# Patient Record
Sex: Female | Born: 1955
Health system: Southern US, Community
[De-identification: ages and names within clinical notes are randomized; demographics above are authoritative.]

## PROBLEM LIST (undated history)

## (undated) DIAGNOSIS — I479 Paroxysmal tachycardia, unspecified: Secondary | ICD-10-CM

## (undated) DIAGNOSIS — J45909 Unspecified asthma, uncomplicated: Secondary | ICD-10-CM

## (undated) DIAGNOSIS — I10 Essential (primary) hypertension: Secondary | ICD-10-CM

## (undated) DIAGNOSIS — M949 Disorder of cartilage, unspecified: Secondary | ICD-10-CM

## (undated) DIAGNOSIS — Z8489 Family history of other specified conditions: Secondary | ICD-10-CM

## (undated) DIAGNOSIS — M1990 Unspecified osteoarthritis, unspecified site: Secondary | ICD-10-CM

## (undated) DIAGNOSIS — M899 Disorder of bone, unspecified: Secondary | ICD-10-CM

## (undated) DIAGNOSIS — Z9189 Other specified personal risk factors, not elsewhere classified: Secondary | ICD-10-CM

## (undated) DIAGNOSIS — E785 Hyperlipidemia, unspecified: Secondary | ICD-10-CM

## (undated) DIAGNOSIS — M542 Cervicalgia: Secondary | ICD-10-CM

## (undated) DIAGNOSIS — S82831A Other fracture of upper and lower end of right fibula, initial encounter for closed fracture: Secondary | ICD-10-CM

## (undated) HISTORY — DX: Unspecified asthma, uncomplicated: J45.909

## (undated) HISTORY — DX: Hyperlipidemia, unspecified: E78.5

## (undated) HISTORY — DX: Essential (primary) hypertension: I10

## (undated) HISTORY — DX: Disorder of bone, unspecified: M89.9

## (undated) HISTORY — DX: Disorder of cartilage, unspecified: M94.9

## (undated) HISTORY — PX: DILATION AND CURETTAGE OF UTERUS: SHX78

## (undated) HISTORY — DX: Unspecified osteoarthritis, unspecified site: M19.90

## (undated) HISTORY — DX: Paroxysmal tachycardia, unspecified: I47.9

## (undated) HISTORY — DX: Cervicalgia: M54.2

## (undated) HISTORY — DX: Other specified personal risk factors, not elsewhere classified: Z91.89

---

## 1998-08-18 ENCOUNTER — Encounter: Payer: Self-pay | Admitting: *Deleted

## 1998-08-18 ENCOUNTER — Ambulatory Visit (HOSPITAL_COMMUNITY): Admission: RE | Admit: 1998-08-18 | Discharge: 1998-08-18 | Payer: Self-pay | Admitting: *Deleted

## 1998-10-07 ENCOUNTER — Encounter: Admission: RE | Admit: 1998-10-07 | Discharge: 1998-12-04 | Payer: Self-pay | Admitting: Orthopaedic Surgery

## 1999-12-01 ENCOUNTER — Ambulatory Visit (HOSPITAL_COMMUNITY): Admission: RE | Admit: 1999-12-01 | Discharge: 1999-12-01 | Payer: Self-pay | Admitting: Internal Medicine

## 2001-03-08 ENCOUNTER — Encounter: Payer: Self-pay | Admitting: Internal Medicine

## 2001-03-08 ENCOUNTER — Encounter: Admission: RE | Admit: 2001-03-08 | Discharge: 2001-03-08 | Payer: Self-pay | Admitting: Internal Medicine

## 2002-10-18 ENCOUNTER — Encounter: Payer: Self-pay | Admitting: Internal Medicine

## 2002-10-18 ENCOUNTER — Ambulatory Visit (HOSPITAL_COMMUNITY): Admission: RE | Admit: 2002-10-18 | Discharge: 2002-10-18 | Payer: Self-pay | Admitting: Internal Medicine

## 2002-11-27 ENCOUNTER — Encounter: Payer: Self-pay | Admitting: General Surgery

## 2002-11-27 ENCOUNTER — Encounter: Admission: RE | Admit: 2002-11-27 | Discharge: 2002-11-27 | Payer: Self-pay | Admitting: General Surgery

## 2002-12-03 ENCOUNTER — Encounter: Payer: Self-pay | Admitting: Pulmonary Disease

## 2002-12-03 ENCOUNTER — Encounter: Admission: RE | Admit: 2002-12-03 | Discharge: 2002-12-03 | Payer: Self-pay | Admitting: Pulmonary Disease

## 2005-02-11 ENCOUNTER — Encounter: Admission: RE | Admit: 2005-02-11 | Discharge: 2005-02-11 | Payer: Self-pay | Admitting: Internal Medicine

## 2005-07-19 ENCOUNTER — Ambulatory Visit: Payer: Self-pay | Admitting: Pulmonary Disease

## 2005-09-06 ENCOUNTER — Other Ambulatory Visit: Admission: RE | Admit: 2005-09-06 | Discharge: 2005-09-06 | Payer: Self-pay | Admitting: Obstetrics and Gynecology

## 2005-09-15 ENCOUNTER — Ambulatory Visit: Payer: Self-pay | Admitting: Internal Medicine

## 2006-05-23 LAB — HM MAMMOGRAPHY: HM Mammogram: NORMAL

## 2006-08-17 ENCOUNTER — Encounter: Payer: Self-pay | Admitting: Internal Medicine

## 2006-09-13 ENCOUNTER — Ambulatory Visit (HOSPITAL_COMMUNITY): Admission: RE | Admit: 2006-09-13 | Discharge: 2006-09-13 | Payer: Self-pay | Admitting: Internal Medicine

## 2006-09-18 ENCOUNTER — Encounter: Admission: RE | Admit: 2006-09-18 | Discharge: 2006-09-18 | Payer: Self-pay | Admitting: Internal Medicine

## 2006-09-26 ENCOUNTER — Ambulatory Visit: Payer: Self-pay

## 2006-09-26 ENCOUNTER — Encounter: Payer: Self-pay | Admitting: Internal Medicine

## 2007-01-26 ENCOUNTER — Ambulatory Visit: Payer: Self-pay | Admitting: Internal Medicine

## 2007-01-26 LAB — CONVERTED CEMR LAB: Direct LDL: 149.7 mg/dL

## 2007-02-12 ENCOUNTER — Encounter: Payer: Self-pay | Admitting: Internal Medicine

## 2007-08-27 DIAGNOSIS — R609 Edema, unspecified: Secondary | ICD-10-CM | POA: Insufficient documentation

## 2007-09-06 ENCOUNTER — Encounter: Payer: Self-pay | Admitting: Internal Medicine

## 2007-09-06 DIAGNOSIS — F5104 Psychophysiologic insomnia: Secondary | ICD-10-CM | POA: Insufficient documentation

## 2007-09-06 DIAGNOSIS — G4700 Insomnia, unspecified: Secondary | ICD-10-CM | POA: Insufficient documentation

## 2007-09-06 DIAGNOSIS — M1990 Unspecified osteoarthritis, unspecified site: Secondary | ICD-10-CM | POA: Insufficient documentation

## 2007-11-22 ENCOUNTER — Telehealth: Payer: Self-pay | Admitting: Family Medicine

## 2008-04-23 ENCOUNTER — Telehealth: Payer: Self-pay | Admitting: Internal Medicine

## 2008-04-23 ENCOUNTER — Ambulatory Visit: Payer: Self-pay | Admitting: Internal Medicine

## 2008-04-24 LAB — CONVERTED CEMR LAB
ALT: 17 units/L (ref 0–35)
Albumin: 3.7 g/dL (ref 3.5–5.2)
Total Bilirubin: 0.3 mg/dL (ref 0.3–1.2)

## 2008-06-09 ENCOUNTER — Emergency Department (HOSPITAL_COMMUNITY): Admission: EM | Admit: 2008-06-09 | Discharge: 2008-06-09 | Payer: Self-pay | Admitting: Family Medicine

## 2008-06-17 ENCOUNTER — Encounter (INDEPENDENT_AMBULATORY_CARE_PROVIDER_SITE_OTHER): Payer: Self-pay | Admitting: Cardiology

## 2008-06-17 ENCOUNTER — Ambulatory Visit (HOSPITAL_COMMUNITY): Admission: RE | Admit: 2008-06-17 | Discharge: 2008-06-17 | Payer: Self-pay | Admitting: Cardiology

## 2008-07-21 LAB — CONVERTED CEMR LAB: Pap Smear: NORMAL

## 2008-08-28 ENCOUNTER — Ambulatory Visit: Payer: Self-pay | Admitting: Internal Medicine

## 2008-08-28 DIAGNOSIS — E785 Hyperlipidemia, unspecified: Secondary | ICD-10-CM | POA: Insufficient documentation

## 2008-08-28 DIAGNOSIS — Z9289 Personal history of other medical treatment: Secondary | ICD-10-CM | POA: Insufficient documentation

## 2008-08-28 DIAGNOSIS — I479 Paroxysmal tachycardia, unspecified: Secondary | ICD-10-CM | POA: Insufficient documentation

## 2008-08-28 DIAGNOSIS — E7849 Other hyperlipidemia: Secondary | ICD-10-CM | POA: Insufficient documentation

## 2008-08-28 DIAGNOSIS — I471 Supraventricular tachycardia: Secondary | ICD-10-CM | POA: Insufficient documentation

## 2008-08-28 DIAGNOSIS — E7800 Pure hypercholesterolemia, unspecified: Secondary | ICD-10-CM | POA: Insufficient documentation

## 2008-08-29 LAB — CONVERTED CEMR LAB
ALT: 16 units/L (ref 0–35)
AST: 19 units/L (ref 0–37)
BUN: 9 mg/dL (ref 6–23)
Basophils Relative: 0.6 % (ref 0.0–3.0)
Bilirubin Urine: NEGATIVE
Bilirubin, Direct: 0.1 mg/dL (ref 0.0–0.3)
Chloride: 106 meq/L (ref 96–112)
Eosinophils Absolute: 0.7 10*3/uL (ref 0.0–0.7)
Eosinophils Relative: 10.1 % — ABNORMAL HIGH (ref 0.0–5.0)
HCT: 41 % (ref 36.0–46.0)
Hemoglobin: 14.1 g/dL (ref 12.0–15.0)
Ketones, ur: NEGATIVE mg/dL
Leukocytes, UA: NEGATIVE
Lymphocytes Relative: 25.6 % (ref 12.0–46.0)
MCHC: 34.4 g/dL (ref 30.0–36.0)
MCV: 86.2 fL (ref 78.0–100.0)
Neutro Abs: 4.2 10*3/uL (ref 1.4–7.7)
Neutrophils Relative %: 56.3 % (ref 43.0–77.0)
Nitrite: NEGATIVE
Platelets: 288 10*3/uL (ref 150.0–400.0)
RBC: 4.76 M/uL (ref 3.87–5.11)
RDW: 12.3 % (ref 11.5–14.6)
Sodium: 142 meq/L (ref 135–145)
Specific Gravity, Urine: 1.025 (ref 1.000–1.030)
Total CHOL/HDL Ratio: 5
Total Protein, Urine: NEGATIVE mg/dL
WBC: 7.3 10*3/uL (ref 4.5–10.5)

## 2008-09-01 LAB — CONVERTED CEMR LAB: Vit D, 25-Hydroxy: 11 ng/mL — ABNORMAL LOW (ref 30–89)

## 2008-09-16 ENCOUNTER — Telehealth: Payer: Self-pay | Admitting: Internal Medicine

## 2008-12-03 ENCOUNTER — Encounter: Payer: Self-pay | Admitting: Internal Medicine

## 2009-01-22 ENCOUNTER — Telehealth: Payer: Self-pay | Admitting: Internal Medicine

## 2009-08-26 ENCOUNTER — Telehealth: Payer: Self-pay | Admitting: Internal Medicine

## 2009-09-04 ENCOUNTER — Telehealth: Payer: Self-pay | Admitting: Internal Medicine

## 2009-10-13 ENCOUNTER — Telehealth: Payer: Self-pay | Admitting: Internal Medicine

## 2009-11-03 ENCOUNTER — Ambulatory Visit: Payer: Self-pay | Admitting: Internal Medicine

## 2009-11-03 LAB — CONVERTED CEMR LAB
ALT: 17 units/L (ref 0–35)
Albumin: 4.1 g/dL (ref 3.5–5.2)
BUN: 8 mg/dL (ref 6–23)
Basophils Absolute: 0 10*3/uL (ref 0.0–0.1)
Bilirubin Urine: NEGATIVE
CO2: 28 meq/L (ref 19–32)
Calcium: 9.3 mg/dL (ref 8.4–10.5)
Chloride: 105 meq/L (ref 96–112)
Creatinine, Ser: 0.7 mg/dL (ref 0.4–1.2)
Glucose, Bld: 97 mg/dL (ref 70–99)
HCT: 41.4 % (ref 36.0–46.0)
HDL: 42.5 mg/dL (ref >39.00)
Hemoglobin, Urine: NEGATIVE
LDL Cholesterol: 108 mg/dL — ABNORMAL HIGH (ref 0–99)
Leukocytes, UA: NEGATIVE
Lymphocytes Relative: 24.8 % (ref 12.0–46.0)
Lymphs Abs: 1.8 10*3/uL (ref 0.7–4.0)
MCHC: 34.5 g/dL (ref 30.0–36.0)
MCV: 87.5 fL (ref 78.0–100.0)
Nitrite: NEGATIVE
Potassium: 4.7 meq/L (ref 3.5–5.1)
RBC: 4.73 M/uL (ref 3.87–5.11)
RDW: 13.1 % (ref 11.5–14.6)
Total CHOL/HDL Ratio: 4
Total Protein: 6.6 g/dL (ref 6.0–8.3)
Triglycerides: 135 mg/dL (ref 0.0–149.0)
Urine Glucose: NEGATIVE mg/dL
VLDL: 27 mg/dL (ref 0.0–40.0)
WBC: 7.1 10*3/uL (ref 4.5–10.5)
pH: 7 (ref 5.0–8.0)

## 2009-11-05 ENCOUNTER — Telehealth: Payer: Self-pay | Admitting: Internal Medicine

## 2009-11-05 ENCOUNTER — Ambulatory Visit: Payer: Self-pay | Admitting: Internal Medicine

## 2009-11-05 DIAGNOSIS — M542 Cervicalgia: Secondary | ICD-10-CM

## 2009-11-05 DIAGNOSIS — J4530 Mild persistent asthma, uncomplicated: Secondary | ICD-10-CM | POA: Insufficient documentation

## 2009-11-05 HISTORY — DX: Cervicalgia: M54.2

## 2009-11-11 ENCOUNTER — Ambulatory Visit: Payer: Self-pay | Admitting: Internal Medicine

## 2009-11-11 ENCOUNTER — Encounter: Payer: Self-pay | Admitting: Internal Medicine

## 2009-11-20 ENCOUNTER — Telehealth: Payer: Self-pay | Admitting: Internal Medicine

## 2009-11-26 DIAGNOSIS — M810 Age-related osteoporosis without current pathological fracture: Secondary | ICD-10-CM | POA: Insufficient documentation

## 2009-11-26 DIAGNOSIS — M8580 Other specified disorders of bone density and structure, unspecified site: Secondary | ICD-10-CM | POA: Insufficient documentation

## 2009-12-15 ENCOUNTER — Telehealth: Payer: Self-pay | Admitting: Internal Medicine

## 2009-12-23 ENCOUNTER — Encounter: Payer: Self-pay | Admitting: Internal Medicine

## 2010-01-08 ENCOUNTER — Encounter (INDEPENDENT_AMBULATORY_CARE_PROVIDER_SITE_OTHER): Payer: Self-pay | Admitting: *Deleted

## 2010-04-05 ENCOUNTER — Encounter: Admission: RE | Admit: 2010-04-05 | Discharge: 2010-04-05 | Payer: Self-pay | Admitting: Obstetrics and Gynecology

## 2010-04-29 ENCOUNTER — Telehealth (INDEPENDENT_AMBULATORY_CARE_PROVIDER_SITE_OTHER): Payer: Self-pay | Admitting: *Deleted

## 2010-05-07 ENCOUNTER — Ambulatory Visit: Payer: Self-pay | Admitting: Internal Medicine

## 2010-06-22 NOTE — Assessment & Plan Note (Signed)
Summary: CPX/NWS   Vital Signs:  Patient profile:   55 year old female Height:      62 inches (157.48 cm) Weight:      191.12 pounds (86.87 kg) BMI:     35.08 O2 Sat:      97 % on Room air Temp:     98.0 degrees F (36.67 degrees C) oral Pulse rate:   105 / minute BP sitting:   118 / 80  (left arm) Cuff size:   large  Vitals Entered By: Orlan Leavens (November 05, 2009 8:11 AM)  O2 Flow:  Room air CC: CPX/ Need refill on nexium & verapamil Is Patient Diabetic? No Pain Assessment Patient in pain? no        Primary Care Provider:  Newt Lukes MD  CC:  CPX/ Need refill on nexium & verapamil.  History of Present Illness: patient is here today for annual physical. Patient feels well and has no complaints.   review of other chronic med issues - has seen dr. 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 little (cards)  2) occ R sided abd pain - maybe 2 episodes per month - can be severe cramping but not long duration not clearly a/w food/meals or time of day. has had gyn exam and pelivec u/s - no abn identified.  3) asthma hx - reports h/o  +ppd in 2002 after exposure to "white powder" via unmarked envelop to her office - no  +PPD prior to 2002 or known TB hx. 3 office staff with +PPD after that exposure - "powder" never further analyzed - no flare of asthma symptoms but would like CXR to eval annually   also, c/o neck pain - onset >6 months ago -  progressive pain symptoms - no radiation to shoulders or hands - no balance or dizzy problems -  would like to see [facility]ner for eval of neck pain - no prior imaging ever done   Preventive Screening-Counseling & Management  Alcohol-Tobacco     Alcohol drinks/day: <1     Smoking Status: quit > 6 months     Year Started: 1996     Year Quit: 2002  Caffeine-Diet-Exercise  Does Patient Exercise: no     Exercise Counseling: to improve exercise regimen  Safety-Violence-Falls     Seat Belt Counseling: not indicated; patient wears seat belts     Helmet Counseling: not indicated; patient wears helmet when riding bicycle/motocycle     Smoke Detector Counseling: n/a     Violence Counseling: not indicated; no violence risk noted     Fall Risk Counseling: not indicated; no significant falls noted  Clinical Review Panels:  Prevention   Last Mammogram:  normal (05/23/2006)   Last Pap Smear:  normal (07/21/2008)  Immunizations   Last Tetanus Booster:  Tdap (08/28/2008)  Lipid Management   Cholesterol:  177 (11/03/2009)   LDL (bad choesterol):  108 (11/03/2009)   HDL (good cholesterol):  42.50 (11/03/2009)  CBC   WBC:  7.1 (11/03/2009)   RBC:  4.73 (11/03/2009)   Hgb:  14.3 (11/03/2009)   Hct:  41.4 (11/03/2009)   Platelets:  309.0 (11/03/2009)   MCV  87.5 (11/03/2009)   MCHC  34.5 (11/03/2009)   RDW  13.1 (11/03/2009)   PMN:  59.0 (11/03/2009)   Lymphs:  24.8 (11/03/2009)   Monos:  7.8 (11/03/2009)   Eosinophils:  7.8 (11/03/2009)   Basophil:  0.6 (11/03/2009)  Complete Metabolic Panel   Glucose:  97 (11/03/2009)   Sodium:  141 (11/03/2009)   Potassium:  4.7 (11/03/2009)   Chloride:  105 (11/03/2009)   CO2:  28 (11/03/2009)   BUN:  8 (11/03/2009)   Creatinine:  0.7 (11/03/2009)   Albumin:  4.1 (11/03/2009)   Total Protein:  6.6 (11/03/2009)   Calcium:  9.3 (11/03/2009)   Total Bili:  0.6 (11/03/2009)   Alk Phos:  74 (11/03/2009)   SGPT (ALT):  17 (11/03/2009)   SGOT (AST):  17 (11/03/2009)   Current Medications (verified): 1)  Cyclobenzaprine Hcl 10 Mg  Tabs (Cyclobenzaprine Hcl) .Marland Kitchen.. 1 Three Times A Day Prn 2)  Nexium 40 Mg  Cpdr (Esomeprazole Magnesium) .Marland Kitchen.. 1 Daily 3)  Verapamil Hcl Cr 180 Mg Xr24h-Cap (Verapamil Hcl) .... Take 1 By Mouth Qd 4)  Lorazepam 1 Mg Tabs (Lorazepam) .... 1/-2 Tabs For Sleep If Needed  Allergies  (verified): 1)  ! 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 I have reviewed all other systems and they were negative.   Physical Exam  General:  alert, well-developed, well-nourished, and cooperative to examination.    Head:  normocephalic, atraumatic, and no abnormalities observed.    Eyes:  vision grossly intact; pupils equal, round and reactive to light.  conjunctiva and lids normal.    Ears:  External ear exam shows no significant lesions or deformities.  Otoscopic examination reveals clear canals, tympanic membranes are intact bilaterally without bulging, retraction, inflammation or discharge. Hearing is grossly normal bilaterally. Nose:  External nasal examination shows no deformity or inflammation. Nasal mucosa are pink and moist without lesions or exudates. Mouth:  Oral mucosa and oropharynx without lesions or exudates.  Teeth in good repair, mild staining noted Neck:  No deformities, masses, or tenderness noted. no thyromegaly and no thyroid nodules or tenderness.   Lungs:  normal respiratory effort, no intercostal retractions or use of accessory muscles; normal breath sounds bilaterally - no crackles and no wheezes.    Heart:  normal rate, regular rhythm, no murmur, and no rub. BLE without edema. normal DP pulses and normal cap refill in all 4 extremities    Abdomen:  soft, non-tender, normal bowel sounds, no distention; no masses and no appreciable hepatomegaly or  splenomegaly.   Genitalia:  defer to gyn Msk:  No deformity or scoliosis noted of thoracic or lumbar spine.   Neurologic:  alert & oriented X3 and cranial nerves II-XII symetrically intact.  strength normal in all extremities, sensation intact to light touch, and gait normal. speech fluent without dysarthria or aphasia; follows commands with good comprehension.  Skin:  Intact without suspicious lesions or rashes Psych:  Oriented X3, memory intact for recent and remote, normally interactive, good eye contact, not anxious appearing, not depressed appearing, and not agitated.      Impression & Recommendations:  Problem # 1:  PREVENTIVE  HEALTH CARE (ICD-V70.0) Patient has been counseled on age-appropriate routine health concerns for screening and prevention. These are reviewed and up-to-date. Immunizations are up-to-date or declined. Labs and ECG reviewed. refer for screening colo and bone density Orders: EKG w/ Interpretation (93000) T-Bone Densitometry ([address]) T-Lumbar Vertebral Assessment (60454) Gastroenterology Referral (GI)  Problem # 2:  NECK PAIN (ICD-723.1) ?DDD -  check labs and refer to Nsurg per request Her updated medication list for this problem includes:    Cyclobenzaprine Hcl 10 Mg Tabs (Cyclobenzaprine hcl) .Marland Kitchen... 1 three times a day prn    Aspirin 81 Mg Tabs (Aspirin) .Marland Kitchen... 1 by mouth once daily  Orders: T-Cervical Spine Comp 4 Views [phone]) Neurosurgeon Referral (Neurosurgeon)  Problem # 3:  ASTHMA, UNSPECIFIED, UNSPECIFIED STATUS (ICD-493.90)  Orders: T-2 View CXR (71020TC)  Complete Medication List: 1)  Cyclobenzaprine Hcl 10 Mg Tabs (Cyclobenzaprine hcl) .Marland Kitchen.. 1 three times a day prn 2)  Nexium 40 Mg Cpdr (Esomeprazole magnesium) .Marland Kitchen.. 1 daily 3)  Verapamil Hcl Cr 180 Mg Xr24h-cap (Verapamil hcl) .... Take 1 by mouth qd 4)  Lorazepam 1 Mg Tabs (Lorazepam) .... 1/-2 tabs for sleep if needed 5)  Aspirin 81 Mg Tabs (Aspirin) .Marland Kitchen.. 1 by mouth once daily 6)  Vitamin  D 1000 Unit Tabs (Cholecalciferol) .Marland Kitchen.. 1 by mouth once daily  Patient Instructions: 1)  it was good to see you today.  2)  labs and exam lok good as reviewed - 3)  we'll make referral for bone density scan, colonoscopy with Christella Hartigan, and evaluation by [facility] for your neck. Our office will contact you regarding these appointments once made.  4)  xrays ordered today for chest and neck - your results will be posted on the phone tree for review in 48-72 hours from the time of test completion; call [phone] and enter your 9 digit MRN (listed above on this page, just below your name); if any changes need to be made or there are abnormal results, you will be contacted directly.  5)  Please schedule a follow-up appointment in 1 year for annual physical and labs, call sooner if problems.  Prescriptions: VERAPAMIL HCL CR 180 MG XR24H-CAP (VERAPAMIL HCL) TAKE 1 by mouth QD  #90 x 3   Entered by:   Orlan Leavens   Authorized by:   Newt Lukes MD   Signed by:   Orlan Leavens on 11/05/2009   Method used:   Electronically to        [facility]* (retail)       [address]       [address] Shipping/mailing       [place], [place]  [address]       Ph: [phone]       Fax: [phone]   RxID:   [id number] NEXIUM 40 MG  CPDR (ESOMEPRAZOLE MAGNESIUM) 1 daily  #90 x 3   Entered by:   Orlan Leavens   Authorized by:   Newt Lukes MD   Signed by:   Orlan Leavens on 11/05/2009   Method used:   Electronically to        [facility]* (retail)       [address]       [address] Shipping/mailing       [place], [place]  [address]       Ph: [phone]       Fax: [phone]   RxID:   [id number]

## 2010-06-22 NOTE — Progress Notes (Signed)
Summary: Halcion for sleep  Phone Note Call from Patient   Summary of Call: Melvin has ongoing difficult chronic insomnia magnified by stress at home. She had tried Ativan which no longer helps sleep. she asks try return to Halcion which was successfull years ago. Script added to med list. Initial call taken by: Waymon Budge MD,  December 15, 2009 8:46 AM  Follow-up for Phone Call        Called to pharmacy([facility]) and pt aware.Reynaldo Minium CMA  December 15, 2009 3:00 PM     New/Updated Medications: TRIAZOLAM 0.25 MG TABS (TRIAZOLAM) 1 for sleep if needed Prescriptions: TRIAZOLAM 0.25 MG TABS (TRIAZOLAM) 1 for sleep if needed  #30 x 5   Entered and Authorized by:   Waymon Budge MD   Signed by:   Waymon Budge MD on 12/15/2009   Method used:   Historical   RxID:   [id number]

## 2010-06-22 NOTE — Progress Notes (Signed)
Summary: Rx change  Phone Note From Pharmacy   Caller: Pharmacy [facility] [phone] Summary of Call: Pharmacist called requesting to change pt Rx to Tab instead of Caps. Pt has always used to tabs. I called and authorized change Initial call taken by: Margaret Pyle, CMA,  November 05, 2009 10:24 AM

## 2010-06-22 NOTE — Letter (Signed)
Summary: Referral - not able to see patient  [facility]  [address] [place], [place] [address]   Phone: [phone]  Fax: [phone]    January 08, 2010    Vikki Ports A. Felicity Coyer, M.D. [address] [place], [place] [address]   Re:   Carrie Dalton DOB:  May 31, 1955 MRN:   [id number]    Dear Dr. Felicity Coyer:  Thank you for your kind referral of the above patient.  We have attempted to schedule the recommended procedure Screening Colonoscopy but have not been able to schedule because:  ___ The patient was not available by phone and/or has not returned our calls.   X  The patient declined to schedule the procedure at this time.  We appreciate the referral and hope that we will have the opportunity to treat this patient in the future.    Sincerely,    [facility] [phone]

## 2010-06-22 NOTE — Progress Notes (Signed)
Summary: Insomnia. Med change  Phone Note Call from Patient   Summary of Call: Chronic insomnia. Clonazepam 1-2 mg not working as well. Back pain aggravating. may be getting tolerance.  we discussed alternatives and will try lorazepam.    New/Updated Medications: LORAZEPAM 1 MG TABS (LORAZEPAM) 1/-2 tabs for sleep if needed Prescriptions: LORAZEPAM 1 MG TABS (LORAZEPAM) 1/-2 tabs for sleep if needed  #30 x 5   Entered and Authorized by:   Waymon Budge MD   Signed by:   Waymon Budge MD on 08/26/2009   Method used:   Print then Give to Patient   RxID:   [id number]

## 2010-06-22 NOTE — Miscellaneous (Signed)
Summary: BONE DENSITY  Clinical Lists Changes  Orders: Added new Test order of T-Bone Densitometry (77080) - Signed Added new Test order of T-Lumbar Vertebral Assessment (77082) - Signed 

## 2010-06-22 NOTE — Progress Notes (Signed)
Summary: Rx refill req  Phone Note Refill Request Message from:  Patient  Refills Requested: Medication #1:  VERAPAMIL HCL CR 180 MG XR24H-CAP TAKE 1 by mouth QD   Dosage confirmed as above?Dosage Confirmed   Supply Requested: 1 month Pt has appt scheduled 11/05/2009  Initial call taken by: Margaret Pyle, CMA,  Oct 13, 2009 8:58 AM    Prescriptions: VERAPAMIL HCL CR 180 MG XR24H-CAP (VERAPAMIL HCL) TAKE 1 by mouth QD  #30 x 0   Entered by:   Margaret Pyle, CMA   Authorized by:   Newt Lukes MD   Signed by:   Margaret Pyle, CMA on 10/13/2009   Method used:   Electronically to        [facility]* (retail)       [address]       [address] Shipping/mailing       [place], [place]  [address]       Ph: [phone]       Fax: [phone]   RxID:   [id number]

## 2010-06-22 NOTE — Progress Notes (Signed)
Summary: QUESTION ABOUT MED  Phone Note From Pharmacy Call back at [phone]   Caller: [facility] Call For: YOUNG  Summary of Call: HAVE QUESTIONS ABOUT LORAZEPAM PRESCRIPT Initial call taken by: Rickard Patience,  August 26, 2009 3:19 PM  Follow-up for Phone Call        RX directions read 1/-2 for sleep. Please clarify if this is 1-2 at bedtime for sleep or 1/2 at bedtime for sleep.Michel Bickers [facility]  August 26, 2009 3:30 PM  Additional Follow-up for Phone Call Additional follow up Details #1::        1/2 to 1 at bedtime as needed;called and spoke with pharmacy.Reynaldo Minium CMA  August 26, 2009 3:40 PM

## 2010-06-22 NOTE — Progress Notes (Signed)
Summary: Leg Edema ?side effects  Phone Note Call from Patient Call back at Work Phone [phone]   Caller: Patient Call For: Dr. Felicity Coyer Summary of Call: Pt states she is having leg edema since being on Verapamil; would like to see if a RX for fluid pill could be called in for short term use.    Pharmacy: [facility] pharmacy. Initial call taken by: Reynaldo Minium CMA,  April 29, 2010 5:33 PM  Follow-up for Phone Call        it would VERY unusual for verapamil to actually cause leg swelling, and none apparently seen at last visit;  I would not feel comfortable due to this for a fluid pill as she suggests;  please consider OV to find out why the fluid may be occurring Follow-up by: Corwin Levins MD,  April 30, 2010 11:19 AM  Additional Follow-up for Phone Call Additional follow up Details #1::        Pt is aware of recs from Dr. Jonny Ruiz.Reynaldo Minium CMA  April 30, 2010 5:24 PM

## 2010-06-22 NOTE — Progress Notes (Signed)
Summary: Rx req/VAL pt  Phone Note Call from Patient Call back at Home Phone [phone]   Caller: Patient X [phone] Summary of Call: Pt called requesting status of referral to [facility] for DDD. Pt states that she is having increased pain in her neck, she has been taking Naproxen and Celebrex but with no relief. I advised pt that all information has been faxed and [facility] are waiting for appt from Dr Danielle Dess. Pt states that she cannot continue until appt in pain. Hermione Romack in a [facility] for Pulmo and says she knows that appt can may take a few weeks. Pt is requesting Rx for pain until appt.  [facility] pt pharmacy. Initial call taken by: Margaret Pyle, CMA,  November 20, 2009 8:40 AM  Follow-up for Phone Call        ok for tramadol as needed   done escript Follow-up by: Corwin Levins MD,  November 20, 2009 9:43 AM  Additional Follow-up for Phone Call Additional follow up Details #1::        Pt informed Additional Follow-up by: Margaret Pyle, CMA,  November 20, 2009 10:02 AM    New/Updated Medications: TRAMADOL HCL 50 MG TABS (TRAMADOL HCL) 1po q 6 hrs as needed Prescriptions: TRAMADOL HCL 50 MG TABS (TRAMADOL HCL) 1po q 6 hrs as needed  #60 x 1   Entered and Authorized by:   Corwin Levins MD   Signed by:   Corwin Levins MD on 11/20/2009   Method used:   Electronically to        [facility]* (retail)       [address]       [address] Shipping/mailing       [place], [place]  [address]       Ph: [phone]       Fax: [phone]   RxID:   [id number]

## 2010-06-22 NOTE — Consult Note (Signed)
Summary: [facility]   [facility]   Imported By: Sherian Rein 12/30/2009 11:57:31  _____________________________________________________________________  External Attachment:    Type:   Image     Comment:   External Document

## 2010-06-22 NOTE — Progress Notes (Signed)
Summary: verapamil  Phone Note Refill Request Message from:  Fax from Pharmacy on September 04, 2009 11:12 AM  Refills Requested: Medication #1:  VERAPAMIL HCL CR 180 MG XR24H-CAP TAKE 1 by mouth QD  Method Requested: Electronic Initial call taken by: Orlan Leavens,  September 04, 2009 11:13 AM    Prescriptions: VERAPAMIL HCL CR 180 MG XR24H-CAP (VERAPAMIL HCL) TAKE 1 by mouth QD  #30 x 0   Entered by:   Orlan Leavens   Authorized by:   Newt Lukes MD   Signed by:   Orlan Leavens on 09/04/2009   Method used:   Electronically to        [facility]* (retail)       [address]       [address] Shipping/mailing       [place], [place]  [address]       Ph: [phone]       Fax: [phone]   RxID:   [id number]

## 2010-06-24 NOTE — Assessment & Plan Note (Signed)
Summary: DISCUSS MEDS/NWS   Vital Signs:  Patient profile:   55 year old female Height:      62 inches (157.48 cm) Weight:      193 pounds (87.73 kg) O2 Sat:      95 % on Room air Temp:     98.3 degrees F (36.83 degrees C) oral Pulse rate:   106 / minute BP sitting:   130 / 74  (left arm) Cuff size:   large  Vitals Entered By: Orlan Leavens RMA (May 07, 2010 1:21 PM)  O2 Flow:  Room air CC: Discuss meds had to stop verapamil due to side effect edema in lower legs Is Patient Diabetic? No Pain Assessment Patient in pain? no        Primary Care Izell Labat:  Newt Lukes MD  CC:  Discuss meds had to stop verapamil due to side effect edema in lower legs.  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 little (cards), considering change to LeB - also cont mild dep edema end of each day   also reveiw chronic med issues: occ R sided abd pain - maybe 2 episodes per month - can be severe cramping but not long duration not clearly a/w food/meals or time of day. has had gyn exam and pelivec u/s - no abn identified.  asthma hx - reports h/o  +ppd in 2002 after exposure to "white powder" via unmarked envelop to her office - no  +PPD prior to 2002 or known TB hx. 3 office staff with +PPD after that exposure - "powder" never further analyzed - no flare of asthma symptoms but would like CXR to eval annually  neck pain - improved no radiation to shoulders or hands - no balance or dizzy problems -  s/p 12/2009 eval by nsurg [facility] for same - rec conserv care   Current Medications (verified): 1)  Cyclobenzaprine Hcl 10 Mg  Tabs (Cyclobenzaprine Hcl) .Marland Kitchen.. 1 Three Times A Day Prn 2)  Nexium 40 Mg  Cpdr  (Esomeprazole Magnesium) .Marland Kitchen.. 1 Daily 3)  Triazolam 0.25 Mg Tabs (Triazolam) .Marland Kitchen.. 1 For Sleep If Needed 4)  Aspirin 81 Mg Tabs (Aspirin) .Marland Kitchen.. 1 By Mouth Once Daily 5)  Vitamin D 1000 Unit Tabs (Cholecalciferol) .Marland Kitchen.. 1 By Mouth Once Daily 6)  Tramadol Hcl 50 Mg Tabs (Tramadol Hcl) .Marland Kitchen.. 1po Q 6 Hrs As Needed 7)  Alendronate Sodium 35 Mg Tabs (Alendronate Sodium) .Marland Kitchen.. 1 By Mouth Q Week  Allergies (verified): 1)  ! Compazine  Past History:  Past Medical History: SLEEP DISORDER, HX OF (ICD-V15.89) DEGENERATIVE JOINT DISEASE (ICD-715.90) EDEMA, chronic asthma palpitations  MD roster: pulm - young card - little gyn - [facility] - gould  Review of Systems       The patient complains of peripheral edema.  The patient denies fever, weight loss, chest pain, dyspnea on exertion, and headaches.    Physical Exam  General:  alert, well-developed, well-nourished, and cooperative to examination.    Lungs:  normal respiratory effort, no intercostal retractions or use of accessory muscles; normal breath sounds bilaterally - no crackles and no wheezes.    Heart:  tachy rate, regular rhythm, no murmur, and no rub.  BLE without edema.    Impression & Recommendations:  Problem # 1:  Hx of UNSPECIFIED PAROXYSMAL TACHYCARDIA (ICD-427.2)  change verapamil to bystolic due to edema SE - titration discussed - consider cards reeval if unable to control medically Her updated medication list for this problem includes:    Aspirin 81 Mg Tabs (Aspirin) .Marland Kitchen... 1 by mouth once daily    Bystolic 20 Mg Tabs (Nebivolol hcl) .Marland Kitchen... 1 by mouth once daily (or as directed)  Labs Reviewed: Na: 141 (11/03/2009)   K+: 4.7 (11/03/2009)   CL: 105 (11/03/2009)   HCO3: 28 (11/03/2009) Ca: 9.3 (11/03/2009)   TSH: 1.37 (11/03/2009)   HCO3: 28 (11/03/2009)  Problem # 2:  EDEMA (ICD-782.3)  chronic, but worse with CCB -stop cerapamil due to same ok for as needed lasix use - erx done Her updated medication list for  this problem includes:    Lasix 20 Mg Tabs (Furosemide) .Marland Kitchen... 1/2-1 by mouth once daily as needed for edema/swelling  Discussed elevation of the legs, use of compression stockings, sodium restiction, and medication use.   Orders: Prescription Created Electronically [phone])  Complete Medication List: 1)  Cyclobenzaprine Hcl 10 Mg Tabs (Cyclobenzaprine hcl) .Marland Kitchen.. 1 three times a day prn 2)  Nexium 40 Mg Cpdr (Esomeprazole magnesium) .Marland Kitchen.. 1 daily 3)  Triazolam 0.25 Mg Tabs (Triazolam) .Marland Kitchen.. 1 for sleep if needed 4)  Aspirin 81 Mg Tabs (Aspirin) .Marland Kitchen.. 1 by mouth once daily 5)  Vitamin D 1000 Unit Tabs (Cholecalciferol) .Marland Kitchen.. 1 by mouth once daily 6)  Alendronate Sodium 35 Mg Tabs (Alendronate sodium) .Marland Kitchen.. 1 by mouth q week 7)  Bystolic 20 Mg Tabs (Nebivolol hcl) .Marland Kitchen.. 1 by mouth once daily (or as directed) 8)  Lasix 20 Mg Tabs (Furosemide) .... 1/2-1 by mouth once daily as needed for edema/swelling  Patient Instructions: 1)  it was good to see you today. 2)  bystolic in place of verapamil for heart rate - use as directed - call if continued palpitaions/rapid heart or if problems on medications 3)  furosemide as needed for swelling - your prescription has been electronically submitted to your pharmacy. Please take as directed. Contact our office if you believe you're having problems with the medication(s).  Prescriptions: LASIX 20 MG TABS (FUROSEMIDE) 1/2-1 by mouth once daily as needed for edema/swelling  #90 x 3   Entered and Authorized by:   Newt Lukes MD   Signed by:   Newt Lukes MD on 05/07/2010   Method used:   Electronically to        [facility]* (retail)       [address]       [address] Shipping/mailing       [place], [place]  [address]       Ph: [phone]       Fax: [phone]   RxID:   [id number]    Orders Added: 1)  Est. Patient Level IV [25366] 2)  Prescription Created Electronically [id number]

## 2010-07-19 ENCOUNTER — Encounter: Payer: Self-pay | Admitting: Internal Medicine

## 2010-07-19 ENCOUNTER — Other Ambulatory Visit: Payer: Self-pay | Admitting: Internal Medicine

## 2010-07-19 ENCOUNTER — Ambulatory Visit (INDEPENDENT_AMBULATORY_CARE_PROVIDER_SITE_OTHER): Payer: 59 | Admitting: Internal Medicine

## 2010-07-19 DIAGNOSIS — M542 Cervicalgia: Secondary | ICD-10-CM

## 2010-07-26 ENCOUNTER — Ambulatory Visit (HOSPITAL_COMMUNITY)
Admission: RE | Admit: 2010-07-26 | Discharge: 2010-07-26 | Disposition: A | Discharge status: Home / Self Care | Payer: 59 | Source: Ambulatory Visit | Attending: Internal Medicine | Admitting: Internal Medicine

## 2010-07-26 DIAGNOSIS — M542 Cervicalgia: Secondary | ICD-10-CM | POA: Insufficient documentation

## 2010-07-26 DIAGNOSIS — M5380 Other specified dorsopathies, site unspecified: Secondary | ICD-10-CM | POA: Insufficient documentation

## 2010-07-26 DIAGNOSIS — M5382 Other specified dorsopathies, cervical region: Secondary | ICD-10-CM | POA: Insufficient documentation

## 2010-07-29 NOTE — Assessment & Plan Note (Signed)
Summary: NECK PAIN/NWS   Vital Signs:  Patient profile:   55 year old female O2 Sat:      97 % on Room air Temp:     98.5 degrees F (36.94 degrees C) oral Pulse rate:   87 / minute BP sitting:   118 / 76  (left arm) Cuff size:   large  Vitals Entered By: Orlan Leavens RMA (July 19, 2010 1:42 PM)  O2 Flow:  Room air CC: Neck pain Is Patient Diabetic? No Pain Assessment Patient in pain? yes     Location: neck Type: aching   Primary Care Provider:  Newt Lukes MD  CC:  Neck pain.  History of Present Illness: has seen dr. 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 little (cards), considering change to LeB - also cont mild dep edema end of each day  occ R sided abd pain - maybe 2 episodes per month - can be severe cramping but not long duration not clearly a/w food/meals or time of day. has had gyn exam and pelivec u/s - no abn identified.  asthma hx - reports h/o  +ppd in 2002 after exposure to "white powder" via unmarked envelop to her office - no  +PPD prior to 2002 or known TB hx. 3 office staff with +PPD after that exposure - "powder" never further analyzed - no flare of asthma symptoms but would like CXR to eval annually   Clinical Review Panels:  CBC   WBC:  7.1 (11/03/2009)   RBC:  4.73 (11/03/2009)   Hgb:  14.3 (11/03/2009)   Hct:  41.4 (11/03/2009)  Platelets:  309.0 (11/03/2009)   MCV  87.5 (11/03/2009)   MCHC  34.5 (11/03/2009)   RDW  13.1 (11/03/2009)   PMN:  59.0 (11/03/2009)   Lymphs:  24.8 (11/03/2009)   Monos:  7.8 (11/03/2009)   Eosinophils:  7.8 (11/03/2009)   Basophil:  0.6 (11/03/2009)  Complete Metabolic Panel   Glucose:  97 (11/03/2009)   Sodium:  141 (11/03/2009)   Potassium:  4.7 (11/03/2009)   Chloride:  105 (11/03/2009)   CO2:  28 (11/03/2009)   BUN:  8 (11/03/2009)   Creatinine:  0.7 (11/03/2009)   Albumin:  4.1 (11/03/2009)   Total Protein:  6.6 (11/03/2009)   Calcium:  9.3 (11/03/2009)   Total Bili:  0.6 (11/03/2009)   Alk Phos:  74 (11/03/2009)   SGPT (ALT):  17 (11/03/2009)   SGOT (AST):  17 (11/03/2009)   Current Medications (verified): 1)  Cyclobenzaprine Hcl 10 Mg  Tabs (Cyclobenzaprine Hcl) .Marland Kitchen.. 1 Three Times A Day Prn 2)  Nexium 40 Mg  Cpdr (Esomeprazole Magnesium) .Marland Kitchen.. 1 Daily 3)  Triazolam 0.25 Mg Tabs (Triazolam) .Marland Kitchen.. 1 For Sleep If Needed 4)  Aspirin 81 Mg Tabs (Aspirin) .Marland Kitchen.. 1 By Mouth Once Daily 5)  Vitamin D 1000 Unit Tabs (Cholecalciferol) .Marland KitchenMarland KitchenMarland Kitchen  1 By Mouth Once Daily 6)  Alendronate Sodium 35 Mg Tabs (Alendronate Sodium) .Marland Kitchen.. 1 By Mouth Q Week 7)  Bystolic 20 Mg Tabs (Nebivolol Hcl) .Marland Kitchen.. 1 By Mouth Once Daily (Or As Directed) 8)  Lasix 20 Mg Tabs (Furosemide) .... 1/2-1 By Mouth Once Daily As Needed For Edema/swelling  Allergies (verified): 1)  ! Compazine  Past History:  Past Medical History: SLEEP DISORDER, HX OF  DEGENERATIVE JOINT DISEASE  EDEMA, chronic  asthma palpitations  MD roster: pulm - young card - little gyn - [facility] derm - [facility] NSurg - el[facility]  Review of Systems  The patient denies fever, weight loss, chest pain, syncope, headaches, suspicious skin lesions, and difficulty walking.    Physical Exam  General:  alert, well-developed, well-nourished, and cooperative to examination.    Lungs:  normal respiratory effort, no intercostal retractions or use  of accessory muscles; normal breath sounds bilaterally - no crackles and no wheezes.    Heart:  tachy rate, regular rhythm, no murmur, and no rub. BLE without edema.  Msk:  spasm right neck and scapular region - FROM but tight turning to right side Neurologic:  alert & oriented X3 and cranial nerves II-XII symetrically intact.  strength grossly normal in all extremities including RUE and B grip; sensation intact to light touch, and gait normal. speech fluent without dysarthria or aphasia; follows commands with good comprehension.    Impression & Recommendations:  Problem # 1:  NECK PAIN (ICD-723.1)  Her updated medication list for this problem includes:    Cyclobenzaprine Hcl 10 Mg Tabs (Cyclobenzaprine hcl) .Marland Kitchen... 1 three times a day prn    Aspirin 81 Mg Tabs (Aspirin) .Marland Kitchen... 1 by mouth once daily  known DDD on 10/2009 xray -  s/p Nsurg eval 12/2009 rec conserv care now RUE radiculopathy symptoms - no injury or red flags on hx/exam tx pred pak for acute inflammation and refer for MRI neck at pt request - cont muscle relax and f/u nsurg as needed   Orders: Radiology Referral (Radiology) Prescription Created Electronically [id number])  Complete Medication List: 1)  Cyclobenzaprine Hcl 10 Mg Tabs (Cyclobenzaprine hcl) .Marland Kitchen.. 1 three times a day prn 2)  Nexium 40 Mg Cpdr (Esomeprazole magnesium) .Marland Kitchen.. 1 daily 3)  Triazolam 0.25 Mg Tabs (Triazolam) .Marland Kitchen.. 1 for sleep if needed 4)  Aspirin 81 Mg Tabs (Aspirin) .Marland Kitchen.. 1 by mouth once daily 5)  Vitamin D 1000 Unit Tabs (Cholecalciferol) .Marland Kitchen.. 1 by mouth once daily 6)  Alendronate Sodium 35 Mg Tabs (Alendronate sodium) .Marland Kitchen.. 1 by mouth q week 7)  Bystolic 20 Mg Tabs (Nebivolol hcl) .Marland Kitchen.. 1 by mouth once daily (or as directed) 8)  Lasix 20 Mg Tabs (Furosemide) .... 1/2-1 by mouth once daily as needed for edema/swelling 9)  Prednisone (pak) 10 Mg Tabs (Prednisone) .... As directed x 6 days  Patient Instructions: 1)  it was good to see you today. 2)  pred  pak for your neck pain in place of aleve for next 6 days - use with cyclobenz - your prescription havs been electronically submitted to your pharmacy. Please take as directed. Contact our office if you believe you're having problems with the medication(s).  3)  we'll make referral for MRI neck and then followup at dr. Danielle Dess if needed based on the rsults. Our office will contact you regarding these appointment once made.  4)  Please schedule a follow-up appointment as needed. Prescriptions: PREDNISONE (PAK) 10 MG TABS (PREDNISONE) as directed x 6 days  #  1 pak x 0   Entered and Authorized by:   Newt Lukes MD   Signed by:   Newt Lukes MD on 07/19/2010   Method used:   Electronically to        [facility]* (retail)       [address]       [address]/mail[address]       [place], [place]  [address]       Ph: [phone]       Fax: [phone]   RxID:   [id number]    Orders Added: 1)  Radiology Referral [Radiology] 2)  Est. Patient Level IV [63875] 3)  Prescription Created Electronically [phone]

## 2010-08-04 ENCOUNTER — Telehealth: Payer: Self-pay | Admitting: Internal Medicine

## 2010-08-10 NOTE — Progress Notes (Signed)
Summary: mri c-spine results  Phone Note Outgoing Call   Summary of Call: please call pt - 07/26/10 MRI neck shows bone spur at c5-6 causing moderate narrowing of nerve root exits bilaterally.Marland Kitchen if continued neck or arm pain, would rec f/u with nsurg for tx of same (saw elsner 12/2009) - let me know if new referral needed - thanks Initial call taken by: Newt Lukes MD,  August 04, 2010 9:19 AM  Follow-up for Phone Call        Notified pt with results. Pt states she will cal Dr. Danielle Dess and set appt up if she need addtional records will contact us Follow-up by: Orlan Leavens RMA,  August 04, 2010 10:03 AM

## 2010-09-14 ENCOUNTER — Encounter: Payer: Self-pay | Admitting: Internal Medicine

## 2010-09-15 ENCOUNTER — Other Ambulatory Visit (INDEPENDENT_AMBULATORY_CARE_PROVIDER_SITE_OTHER): Payer: 59

## 2010-09-15 ENCOUNTER — Encounter: Payer: Self-pay | Admitting: Internal Medicine

## 2010-09-15 ENCOUNTER — Ambulatory Visit (INDEPENDENT_AMBULATORY_CARE_PROVIDER_SITE_OTHER): Payer: 59 | Admitting: Internal Medicine

## 2010-09-15 VITALS — BP 118/80 | HR 85 | Temp 98.0°F | Ht 62.0 in | Wt 199.4 lb

## 2010-09-15 DIAGNOSIS — R1011 Right upper quadrant pain: Secondary | ICD-10-CM

## 2010-09-15 LAB — HEPATIC FUNCTION PANEL
ALT: 15 U/L (ref 0–35)
Albumin: 3.9 g/dL (ref 3.5–5.2)
Alkaline Phosphatase: 75 U/L (ref 39–117)
Bilirubin, Direct: 0 mg/dL (ref 0.0–0.3)

## 2010-09-15 LAB — CBC WITH DIFFERENTIAL/PLATELET
Eosinophils Absolute: 0.8 10*3/uL — ABNORMAL HIGH (ref 0.0–0.7)
Eosinophils Relative: 7.1 % — ABNORMAL HIGH (ref 0.0–5.0)
Monocytes Absolute: 0.7 10*3/uL (ref 0.1–1.0)
Monocytes Relative: 6.4 % (ref 3.0–12.0)
RBC: 4.62 Mil/uL (ref 3.87–5.11)

## 2010-09-15 NOTE — Progress Notes (Signed)
  Subjective:    Patient ID: Carrie Dalton, female    DOB: 03-27-1956, 55 y.o.   MRN: [id number]  HPI complains of RUQ pain Ongoing >9mo, worse in past 1 week, usually 2 episodes per month -  Pain described as severe cramping but not long duration not clearly associated with food/meals or time of day.  prior gyn exam and pelivc u/s - no abn identified. No fever, bowel change,  occassional nausea and vomiting   reviewed chronic med issues:  hx palpations - no abnormalities identified on prior echo - controlled with veramapil until development of edema on med - stopped same 04/29/10 - inc palp of med - no recent eval with dr. little (cards), considering change to LeB - also cont mild dep edema end of each day  occ R sided abd pain - asthma hx - reports h/o  +ppd in 2002 after exposure to "white powder" via unmarked envelop to her office - no  +PPD prior to 2002 or known TB hx. 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  Respiratory: Negative for cough and shortness of breath.   Cardiovascular: Negative for chest pain and leg swelling.  Genitourinary: Negative for dysuria and hematuria.      Objective:   Physical Exam BP 118/80  Pulse 85  Temp(Src) 98 F (36.7 C) (Oral)  Ht 5\' 2"  (1.575 m)  Wt 199 lb 6.4 oz (90.447 kg)  BMI 36.47 kg/m2  SpO2 96% Physical Exam  Constitutional: She is oriented to person, place, and time. She appears well-developed and well-nourished. No distress.  Neck: Normal range of motion. Neck supple. No JVD present. No thyromegaly present.  Cardiovascular: Normal rate, regular rhythm and normal heart sounds.  No murmur heard. Pulmonary/Chest: Effort normal and breath sounds normal. No respiratory distress. She has no wheezes.  Abdominal: Soft. Bowel sounds are normal. She exhibits no  distension. There is no tenderness.  Psychiatric: She has a normal mood and affect. Her behavior is normal. Judgment and thought content normal.       Lab Results  Component Value Date   WBC 7.1 11/03/2009   HGB 14.3 11/03/2009   HCT 41.4 11/03/2009   PLT 309.0 11/03/2009   CHOL 177 11/03/2009   TRIG 135.0 11/03/2009   HDL 42.50 11/03/2009   LDLDIRECT 149.7 01/26/2007   ALT 17 11/03/2009   AST 17 11/03/2009   NA 141 11/03/2009   K 4.7 11/03/2009   CL 105 11/03/2009   CREATININE 0.7 11/03/2009   BUN 8 11/03/2009   CO2 28 11/03/2009   TSH 1.37 11/03/2009    Assessment & Plan:  RUQ pain, intermittent

## 2010-09-15 NOTE — Patient Instructions (Signed)
It was good to see you today. Test(s) ordered today. Your results will be called to you after review (48-72hours after test completion). If any changes need to be made, you will be notified at that time. we'll make referral for Ultrasound. Our office will contact you regarding appointment(s) once made. Avoid fatty foods (fried, creamy or dairy) as this will aggravate GB symptoms  follow up as needed depending on results

## 2010-09-15 NOTE — Assessment & Plan Note (Addendum)
Intermittent symptoms >45mo, worse in past 1 week Check labs and abd [place] now Advised on low fat diet and symptomatic control - declines antiemetic at this time

## 2010-09-16 ENCOUNTER — Telehealth: Payer: Self-pay | Admitting: Internal Medicine

## 2010-09-16 ENCOUNTER — Encounter: Payer: Self-pay | Admitting: Internal Medicine

## 2010-09-16 NOTE — Telephone Encounter (Signed)
Called pt no ansew LMOM RTC concerning labs..09/16/10@1 :45pm/LMB

## 2010-09-16 NOTE — Telephone Encounter (Signed)
Please call patient - normal lab results. No medication changes recommended. Will call again when [place] results are back. Thanks.

## 2010-09-16 NOTE — Telephone Encounter (Signed)
Pt return call back gave results concerning labs...09/16/10@2 :46pm/LMB

## 2010-09-17 ENCOUNTER — Ambulatory Visit (HOSPITAL_COMMUNITY)
Admission: RE | Admit: 2010-09-17 | Discharge: 2010-09-17 | Disposition: A | Discharge status: Home / Self Care | Payer: 59 | Source: Ambulatory Visit | Attending: Internal Medicine | Admitting: Internal Medicine

## 2010-09-17 ENCOUNTER — Telehealth: Payer: Self-pay | Admitting: Internal Medicine

## 2010-09-17 DIAGNOSIS — R1011 Right upper quadrant pain: Secondary | ICD-10-CM | POA: Insufficient documentation

## 2010-09-17 DIAGNOSIS — R112 Nausea with vomiting, unspecified: Secondary | ICD-10-CM | POA: Insufficient documentation

## 2010-09-17 DIAGNOSIS — K824 Cholesterolosis of gallbladder: Secondary | ICD-10-CM | POA: Insufficient documentation

## 2010-09-17 NOTE — Telephone Encounter (Signed)
Pt return call back results concerning u/s. Pt states she want to see Gi MD requesting to see Dr. Christella Hartigan..09/17/10@4 :03pm/LMB

## 2010-09-17 NOTE — Telephone Encounter (Signed)
Please call patient -overall normal [place] results: small GB polyp and fatty liver. No medication changes recommended; if continued pain, will make refer to GI as needed to further eval same. Thanks.

## 2010-09-17 NOTE — Telephone Encounter (Signed)
Refer as requested - thanks

## 2010-10-08 NOTE — Assessment & Plan Note (Signed)
[facility]                             PULMONARY OFFICE NOTE   NAME:Carrie Dalton, Carrie Dalton                          MRN:          [id number]  DATE:02/05/2007                            DOB:          09-Feb-1956    HISTORY OF PRESENT ILLNESS:  Patient is a pleasant female patient of Dr.  Roxy Cedar who presents today for an acute office visit.  Patient complains  over the last 2 days she has had tenderness and swollen lymph node in  her left armpit.  Patient complains she felt some tenderness along her  left axilla area over the weekend with notable tender nodule.  This  morning this has decreased in size quite a bit, however it continues to  be quite tender.  Patient had had a mammogram in March of this year  which showed a tiny nodular density at the 1 o'clock position 4  sonometers from the nipple.  Patient had a diagnostic mammogram and  ultrasound.  It was felt these were representative of probable tiny  cysts and had recommended a followup mammogram and ultrasound in 6  months which would be due 1 month from now.  Patient denies any chest  pain, palpitations, shortness of breath, weight loss, abdominal pain,  nausea, vomiting.   PAST MEDICAL HISTORY:  Reviewed.   CURRENT MEDICATIONS:  Reviewed.   PHYSICAL EXAMINATION:  Patient is a pleasant female in no acute  distress.  Blood pressure is 130/70.  HEENT:  Unremarkable.  NECK:  Supple without cervical adenopathy.  No JVD.  LUNGS:  Sounds are clear.  CARDIAC:  Regular rate.  ABDOMEN:  Soft and nontender, no palpitations hepatosplenomegaly.  EXTREMITIES:  Warm without any edema.  BREAST:  Revealed large breasts bilaterally without any palpable  nodules.  Along the left axillary region is a soft mobile nodule that is  tender to palpate.  No redness is noted.   IMPRESSION AND PLAN:  Left axillary nodule, slightly swollen and tender.  Patient is recommended to apply warm compresses to the area as needed.  Alternate Tylenol and Advil times the next 4 days.  If area is still  present will go ahead and set patient up for her diagnostic  mammogram and ultrasound.  Otherwise we will wait until the end of  October when it has already been set up.      Rubye Oaks, NP  Electronically Signed      Clinton D. Maple Hudson, MD, Tonny Bollman, FACP  Electronically Signed   TP/MedQ  DD: 02/05/2007  DT: 02/05/2007  Job #: [id number]

## 2010-10-29 ENCOUNTER — Ambulatory Visit: Payer: 59 | Admitting: Gastroenterology

## 2010-11-30 ENCOUNTER — Other Ambulatory Visit: Payer: Self-pay

## 2010-11-30 MED ORDER — NEBIVOLOL HCL 20 MG PO TABS: 1.0000 | ORAL_TABLET | Freq: Every day | ORAL | Status: DC

## 2010-12-14 ENCOUNTER — Other Ambulatory Visit: Payer: Self-pay | Admitting: Internal Medicine

## 2010-12-16 NOTE — Telephone Encounter (Signed)
Please advise if okay to refill. Thanks.  

## 2010-12-16 NOTE — Telephone Encounter (Signed)
Ok to refill temazepam x 5 months

## 2011-04-04 ENCOUNTER — Other Ambulatory Visit (INDEPENDENT_AMBULATORY_CARE_PROVIDER_SITE_OTHER): Payer: 59

## 2011-04-04 ENCOUNTER — Encounter: Payer: Self-pay | Admitting: Gastroenterology

## 2011-04-04 ENCOUNTER — Ambulatory Visit (INDEPENDENT_AMBULATORY_CARE_PROVIDER_SITE_OTHER): Payer: 59 | Admitting: Gastroenterology

## 2011-04-04 DIAGNOSIS — R109 Unspecified abdominal pain: Secondary | ICD-10-CM

## 2011-04-04 NOTE — Progress Notes (Signed)
HPI: This is a  very pleasant 55 year old woman who I am meeting for the first time today.  RUQ for 6-8 months.  The pain is constant, worse after eating.  Feels like a cramping, pulling.  Waxes, wanes.  She "stays nauseas off an on all the time."  The pains have been getting worse.  She has   Her daughter has acute pancreatitis, is a heart transplant patient.  She is very stressed out.  Her diet has been terrible lately.    Overall her weight is stable.  Can feel bloated after she eats.  Her stools don't have much form to them.  They are not black.    No overt gi bleeding.  Has never had EGD or colonoscopy.    She takes alleve usually 2 a day, sometimes 4 a day.  She also takes 81 asa daily.  Abdominal ultrasound April 2012 was normal except for "echogenic focus on the gallbladder wall" this was 5 mm across, felt to be a gallbladder polyp.     Review of systems: Pertinent positive and negative review of systems were noted in the above HPI section. Complete review of systems was performed and was otherwise normal.    Past Medical History  Diagnosis Date  . UNSPECIFIED PAROXYSMAL TACHYCARDIA   . NECK PAIN 11/05/2009  . OSTEOPENIA   . POSITIVE PPD   . SLEEP DISORDER, HX OF   . DEGENERATIVE JOINT DISEASE   . ASTHMA, UNSPECIFIED, UNSPECIFIED STATUS   . HYPERCHOLESTEROLEMIA, BORDERLINE     Past Surgical History  Procedure Date  . Dilation and curettage of uterus     x 3  . Cesarean section 1984    Current Outpatient Prescriptions  Medication Sig Dispense Refill  . alendronate (FOSAMAX) 35 MG tablet Take 35 mg by mouth every 7 (seven) days. Take with a full glass of water on an empty stomach.       Marland Kitchen aspirin 81 MG tablet Take 81 mg by mouth daily.        . cholecalciferol (VITAMIN D) 1000 UNITS tablet Take 1,000 Units by mouth daily.        . cyclobenzaprine (FLEXERIL) 10 MG tablet Take 10 mg by mouth 3 (three) times daily as needed.        . furosemide (LASIX) 20 MG  tablet Take 20 mg by mouth daily as needed.        . Nebivolol HCl (BYSTOLIC) 20 MG TABS Take 1 tablet (20 mg total) by mouth daily.  90 tablet  1  . NEXIUM 40 MG capsule TAKE 1 CAPSULE BY MOUTH ONCE DAILY  90 capsule  3  . triazolam (HALCION) 0.25 MG tablet TAKE 1 TABLET BY MOUTH AT BEDTIME AS NEEDED FOR SLEEP  30 tablet  5    Allergies as of 04/04/2011 - Review Complete 04/04/2011  Allergen Reaction Noted  . Compazine  04/04/2011  . Prochlorperazine edisylate      Family History  Problem Relation Age of Onset  . Diabetes Father   . Hypertension Father   . Stroke Mother   . Atrial fibrillation Mother   . Prostate cancer Maternal Grandfather   . Atrial fibrillation Maternal Grandmother   . Heart failure Maternal Grandmother     CHF  . Hypertension Maternal Grandmother   . Heart disease Daughter     heart transplant  . Pancreatitis Daughter     History   Social History  . Marital Status: Married    Spouse Name:  N/A    Number of Children: 3  . Years of Education: N/A   Occupational History  . PATIENT CARE COORDIN    Social History Main Topics  . Smoking status: Former Smoker    Quit date: 05/23/2000  . Smokeless tobacco: Never Used  . Alcohol Use: No  . Drug Use: No  . Sexually Active: Not on file   Other Topics Concern  . Not on file   Social History Narrative  . No narrative on file       Physical Exam: BP 110/70  Pulse 80  Ht 5\' 2"  (1.575 m)  Wt 193 lb (87.544 kg)  BMI 35.30 kg/m2 Constitutional: generally well-appearing Psychiatric: alert and oriented x3 Eyes: extraocular movements intact Mouth: oral pharynx moist, no lesions Neck: supple no lymphadenopathy Cardiovascular: heart regular rate and rhythm Lungs: clear to auscultation bilaterally Abdomen: soft, nontender, nondistended, no obvious ascites, no peritoneal signs, normal bowel sounds Extremities: no lower extremity edema bilaterally Skin: no lesions on visible  extremities    Assessment and plan: 55 y.o. female with  intermittent abdominal pain, predominantly right upper quadrant  Her symptoms are not classic for biliary type symptoms and her ultrasound showed no stones. She did have what is called a gallbladder polyp by the radiologist and perhaps that is contributing or causing her symptoms. She does take NSAIDs daily as well as aspirin. I think we should proceed with EGD at [facility] his convenience to check for peptic ulcer disease as that can also cause her symptoms. She'll try to limit her NSAIDs as best as possible in the meantime and continue taking proton pump inhibitor once daily for now. She'll get a basic set of labs including a CBC and a complete metabolic profile

## 2011-04-04 NOTE — Patient Instructions (Signed)
You will have labs checked today in the basement lab.  Please head down after you check out with the front desk  (cbc, cmet) You will be set up for an upper endoscopy for abd pain. Limit the amount of NSAID type medicines that you take (tylenol is safer for your stomach). Stay on nexium once daily.

## 2011-04-05 LAB — CBC WITH DIFFERENTIAL/PLATELET
Basophils Absolute: 0 10*3/uL (ref 0.0–0.1)
Basophils Relative: 0.2 % (ref 0.0–3.0)
Eosinophils Absolute: 0 10*3/uL (ref 0.0–0.7)
Lymphocytes Relative: 11.6 % — ABNORMAL LOW (ref 12.0–46.0)
MCHC: 33.5 g/dL (ref 30.0–36.0)
Neutrophils Relative %: 82 % — ABNORMAL HIGH (ref 43.0–77.0)
Platelets: 416 10*3/uL — ABNORMAL HIGH (ref 150.0–400.0)
RBC: 4.97 Mil/uL (ref 3.87–5.11)

## 2011-04-05 LAB — COMPREHENSIVE METABOLIC PANEL
AST: 21 U/L (ref 0–37)
Albumin: 4.3 g/dL (ref 3.5–5.2)
BUN: 14 mg/dL (ref 6–23)
Calcium: 9.9 mg/dL (ref 8.4–10.5)
Chloride: 106 mEq/L (ref 96–112)
Glucose, Bld: 105 mg/dL — ABNORMAL HIGH (ref 70–99)
Potassium: 3.7 mEq/L (ref 3.5–5.1)

## 2011-04-06 ENCOUNTER — Other Ambulatory Visit: Payer: Self-pay | Admitting: Gastroenterology

## 2011-04-06 DIAGNOSIS — D72829 Elevated white blood cell count, unspecified: Secondary | ICD-10-CM

## 2011-04-08 ENCOUNTER — Telehealth: Payer: Self-pay

## 2011-04-08 NOTE — Telephone Encounter (Signed)
Pt called to cx lec procedure she will call back to re schedule

## 2011-04-19 ENCOUNTER — Telehealth: Payer: Self-pay

## 2011-04-19 NOTE — Telephone Encounter (Signed)
Pt called and reminded to come to the lab for UA and Culture

## 2011-04-19 NOTE — Telephone Encounter (Signed)
Message copied by Donata Duff on Tue Apr 19, 2011 10:38 AM ------      Message from: Donata Duff      Created: Wed Apr 06, 2011  4:29 PM       Pt to have ua and culture done

## 2011-04-20 ENCOUNTER — Other Ambulatory Visit (INDEPENDENT_AMBULATORY_CARE_PROVIDER_SITE_OTHER): Payer: 59

## 2011-04-20 DIAGNOSIS — D72829 Elevated white blood cell count, unspecified: Secondary | ICD-10-CM

## 2011-04-20 LAB — URINALYSIS
Leukocytes, UA: NEGATIVE
Nitrite: NEGATIVE
Specific Gravity, Urine: 1.02 (ref 1.000–1.030)
Urobilinogen, UA: 0.2 (ref 0.0–1.0)

## 2011-04-21 ENCOUNTER — Encounter: Payer: Self-pay | Admitting: Gastroenterology

## 2011-04-21 ENCOUNTER — Other Ambulatory Visit: Payer: Self-pay | Admitting: Gastroenterology

## 2011-04-22 ENCOUNTER — Other Ambulatory Visit: Payer: 59 | Admitting: Gastroenterology

## 2011-04-22 LAB — URINE CULTURE: Colony Count: >=100000

## 2011-05-20 ENCOUNTER — Other Ambulatory Visit: Payer: 59 | Admitting: Gastroenterology

## 2011-06-16 ENCOUNTER — Other Ambulatory Visit: Payer: Self-pay | Admitting: *Deleted

## 2011-06-16 MED ORDER — TRIAZOLAM 0.25 MG PO TABS: ORAL_TABLET | ORAL | Status: DC

## 2011-06-16 NOTE — Telephone Encounter (Signed)
Received a fax request for refill on pt Triazolam 0.25mg , take 1 tab at beditme as needed for sleep #30. Last fill 05-18-11. Please advise.Carron Curie, CMA

## 2011-06-16 NOTE — Telephone Encounter (Signed)
I have called rx into [facility] pharmacy and pt is aware

## 2011-06-16 NOTE — Telephone Encounter (Signed)
Ok to refill x 5 months 

## 2011-07-01 ENCOUNTER — Telehealth: Payer: Self-pay | Admitting: *Deleted

## 2011-07-01 MED ORDER — AZITHROMYCIN 250 MG PO TABS: ORAL_TABLET | ORAL | Status: AC

## 2011-07-01 NOTE — Telephone Encounter (Signed)
I sent script to [facility] for Z pak

## 2011-07-01 NOTE — Telephone Encounter (Signed)
CY Please advise.   ALLERGIES:  Codeine and Compazine

## 2011-07-05 ENCOUNTER — Telehealth: Payer: Self-pay | Admitting: Internal Medicine

## 2011-07-05 MED ORDER — AMOXICILLIN-POT CLAVULANATE 875-125 MG PO TABS: 1.0000 | ORAL_TABLET | Freq: Two times a day (BID) | ORAL | Status: AC

## 2011-07-05 NOTE — Telephone Encounter (Signed)
Finishing Zpak, but still reports fever and green nasal discharge.  Sent script augmentin to her [facility] [address], [place], [place] pharmacy.

## 2011-08-15 ENCOUNTER — Telehealth: Payer: Self-pay | Admitting: Internal Medicine

## 2011-08-15 MED ORDER — ALPRAZOLAM 0.25 MG PO TABS: ORAL_TABLET | ORAL | Status: DC

## 2011-08-15 NOTE — Telephone Encounter (Signed)
Stressed dealing with illness of daughter- discussed. She requests Xanax- discussed. Plan- script for Xanax.

## 2011-09-06 ENCOUNTER — Telehealth: Payer: Self-pay | Admitting: Internal Medicine

## 2011-09-06 MED ORDER — ALPRAZOLAM 0.25 MG PO TABS: ORAL_TABLET | ORAL | Status: DC

## 2011-09-06 NOTE — Telephone Encounter (Signed)
Asks amount of dispensed xanax be increased to allow use of up to 0.5 mg/ dose occasionally if needed.  Script printed.

## 2011-09-08 ENCOUNTER — Other Ambulatory Visit: Payer: Self-pay | Admitting: Internal Medicine

## 2011-09-08 NOTE — Telephone Encounter (Signed)
Please advise if okay to refill. Thanks.  

## 2011-09-08 NOTE — Telephone Encounter (Signed)
OK to refill

## 2011-09-13 ENCOUNTER — Telehealth: Payer: Self-pay | Admitting: Internal Medicine

## 2011-09-13 ENCOUNTER — Ambulatory Visit (INDEPENDENT_AMBULATORY_CARE_PROVIDER_SITE_OTHER)
Admission: RE | Admit: 2011-09-13 | Discharge: 2011-09-13 | Disposition: A | Discharge status: Home / Self Care | Payer: 59 | Source: Ambulatory Visit | Attending: Internal Medicine | Admitting: Internal Medicine

## 2011-09-13 DIAGNOSIS — S93409A Sprain of unspecified ligament of unspecified ankle, initial encounter: Secondary | ICD-10-CM

## 2011-09-13 NOTE — Telephone Encounter (Signed)
Stepped into a hole yesterday. Swelling lateral right ankle, pain right lateral foot.  Probable sprain.  Plan- Xray, elevation, ice, NSAID. Wrap/

## 2011-12-14 ENCOUNTER — Other Ambulatory Visit: Payer: Self-pay | Admitting: Pulmonary Disease

## 2011-12-14 NOTE — Telephone Encounter (Signed)
Refill sent in per Dr. Maple Hudson.

## 2011-12-14 NOTE — Telephone Encounter (Signed)
Faxed refill request received from [facility] for Triazolam 0.25 mg tablet; Take 1 tablet by mouth every night at bedtime as needed for sleep.Patient last seen 2008.  Dr Maple Hudson please advise if ok to refill. Thanks.

## 2011-12-21 ENCOUNTER — Other Ambulatory Visit: Payer: Self-pay | Admitting: Internal Medicine

## 2011-12-21 MED ORDER — TRIAZOLAM 0.25 MG PO TABS: ORAL_TABLET | ORAL | Status: DC

## 2012-02-29 IMAGING — US US ABDOMEN COMPLETE
1 series · 13 of 25 positions shown · non-contrast
Comparison: None.

CLINICAL DATA: Right upper quadrant abdominal pain with nausea and
vomiting

COMPLETE ABDOMINAL ULTRASOUND

[Series 1: us abdomen complete · 0.30mm/px · 13 of 70 slices shown]
[im 1/70]
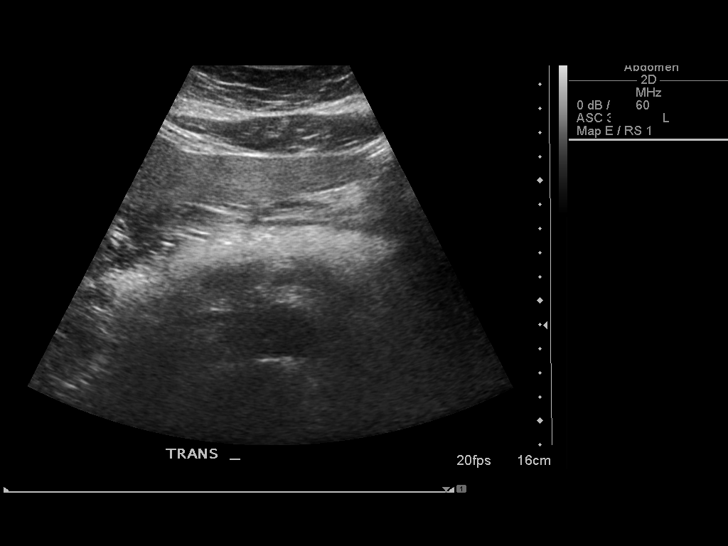
[im 6/70]
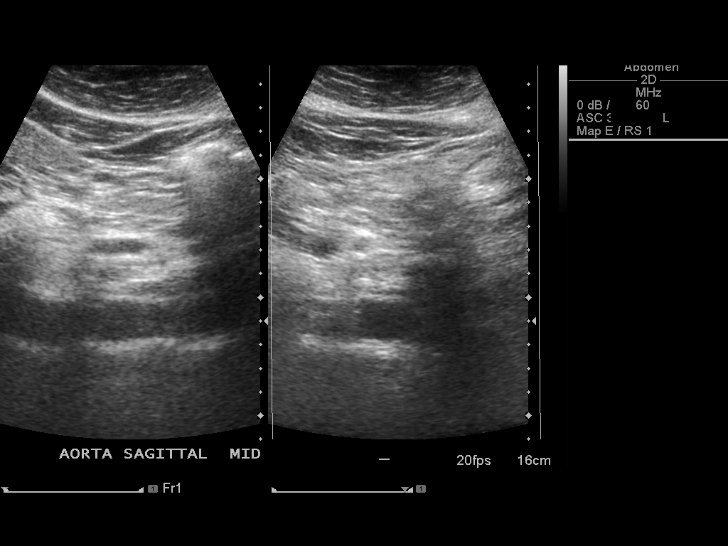
[im 12/70]
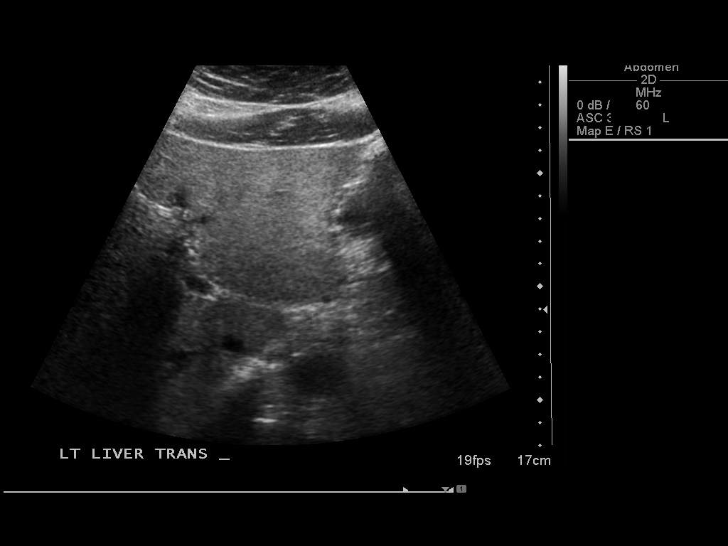
[im 18/70]
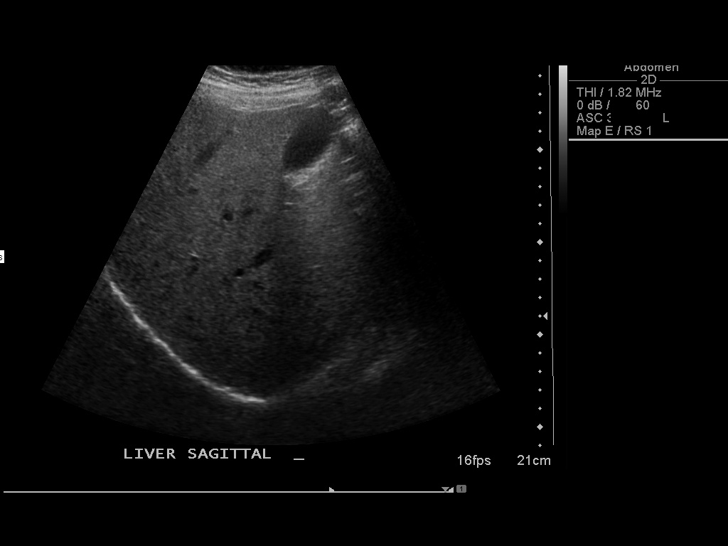
[im 24/70]
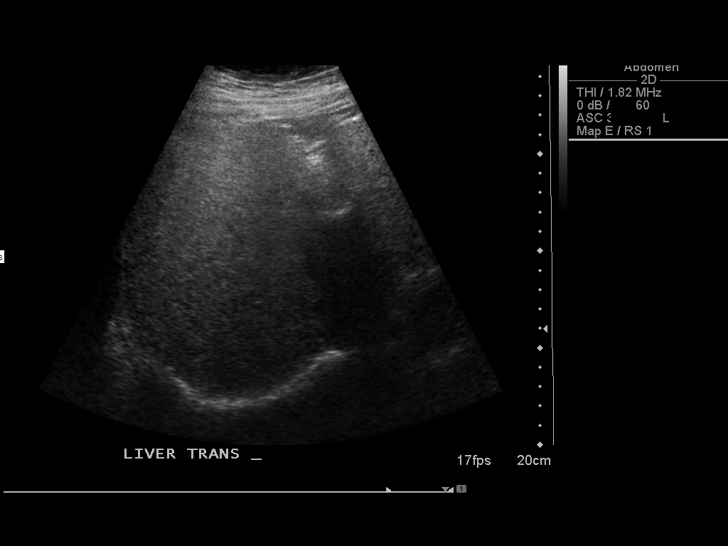
[im 29/70]
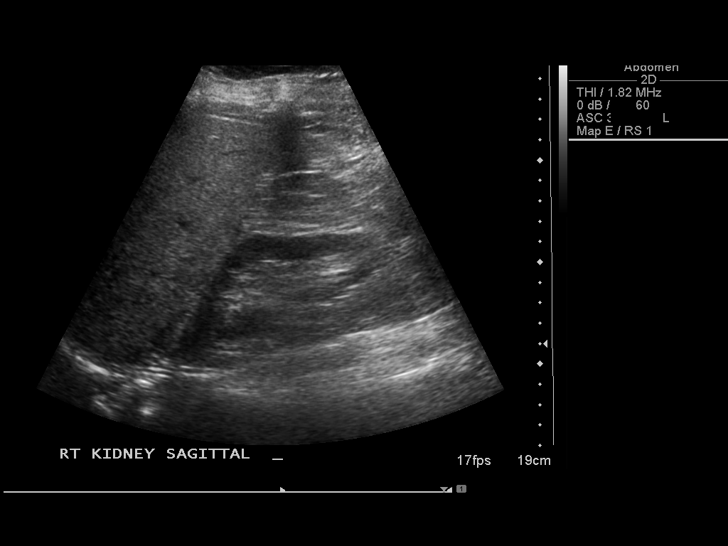
[im 35/70]
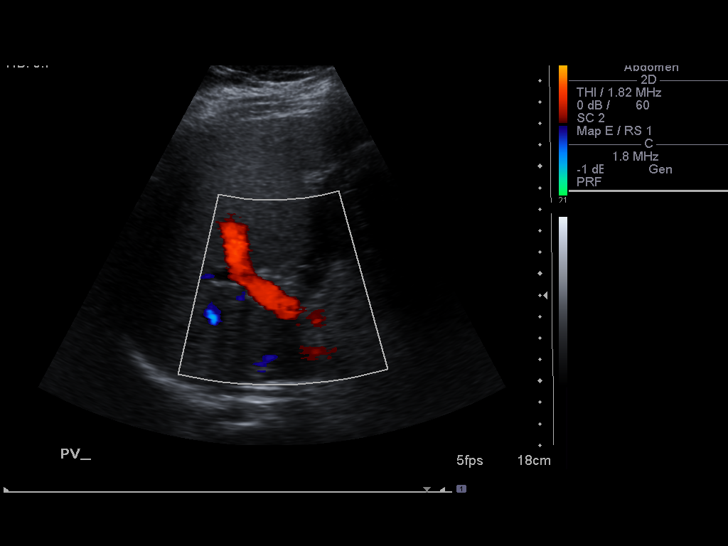
[im 41/70]
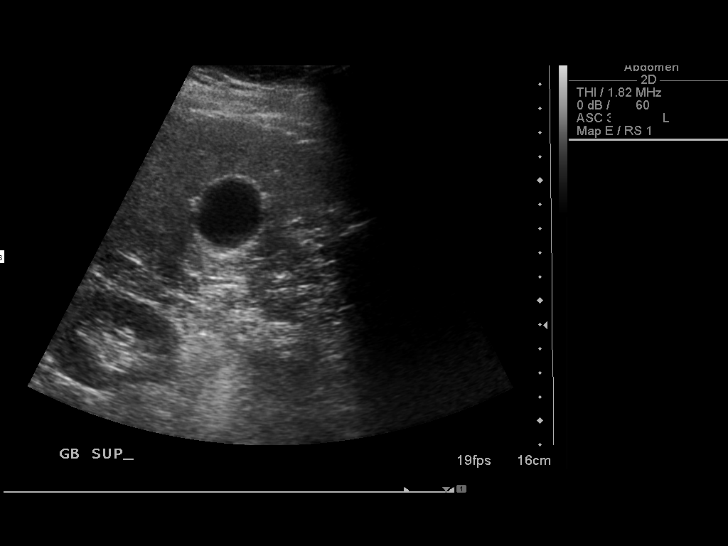
[im 47/70]
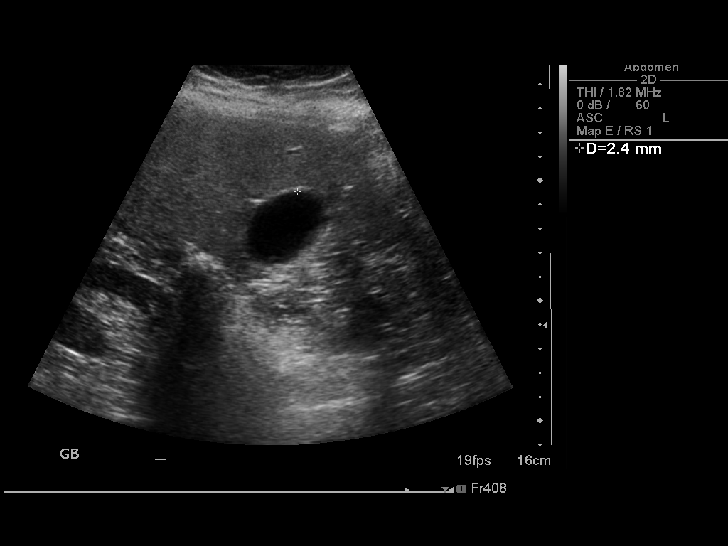
[im 52/70]
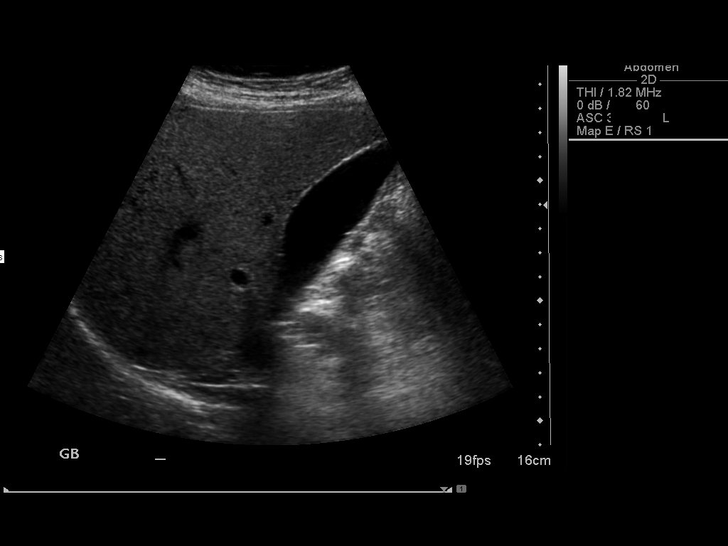
[im 58/70]
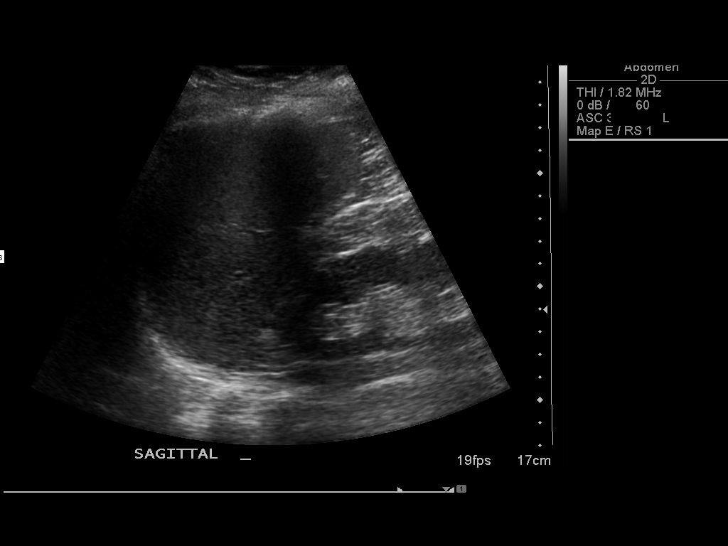
[im 64/70]
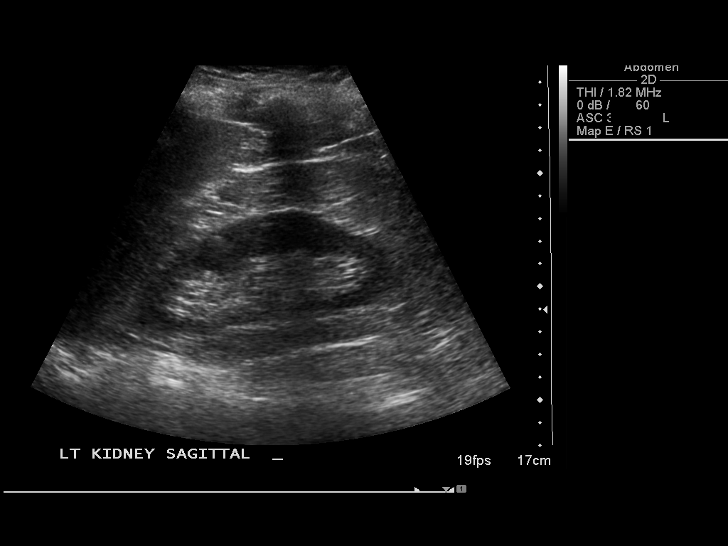
[im 70/70]
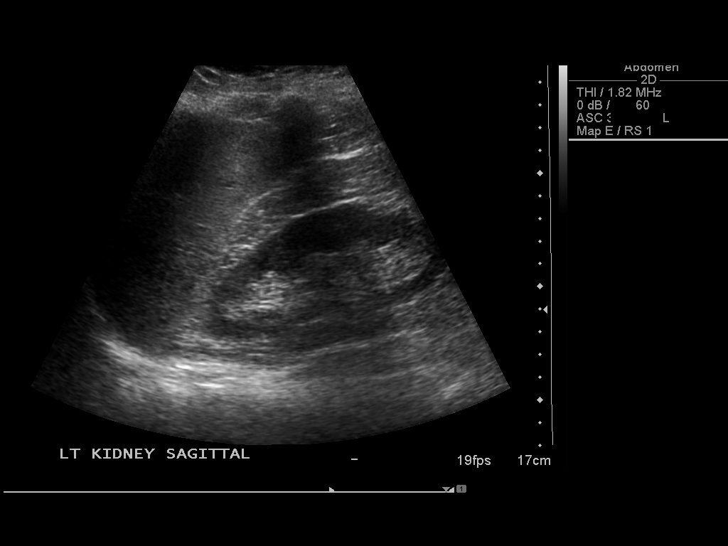

[13 of 25 positions shown; findings below may reference images not displayed]

FINDINGS: Gallbladder:  A focal echogenic nodule is seen emanating from the
anterior portion of the gallbladder wall measuring 5.3 mm and
compatible with a focal polyp.  No intraluminal abnormalities are
otherwise seen.  No gallbladder wall thickening or pericholecystic
fluid is noted.  Evaluation for a sonographic Murphy's sign was
negative

Common bile duct:  Measures 4.4 mm in diameter and has a normal
appearance

Liver:  Overall echogenicity is slightly increased without
obscuration of the underlying hepatic and ductal radicals and no
focal abnormality.  The finding is suggestive of underlying mild
hepatic steatosis.

IVC:  Appears normal.

Pancreas:  No focal abnormality seen.

Spleen:  Has a sagittal length of 8.7 cm.  No focal parenchymal
abnormality is seen.

Right Kidney:  Has a sagittal length of 11.4 cm.  No focal
parenchymal abnormality or signs of hydronephrosis are seen

Left Kidney:  Has a sagittal length of 12.5 cm.  No focal
parenchymal abnormality or signs of hydronephrosis are seen.

Abdominal aorta:  Has a maximal caliber of 2.2 cm with no
aneurysmal dilatation noted
IMPRESSION: Findings suggestive of mild hepatic steatosis.

Focal gallbladder polyp as described above.  No findings of
cholecystitis or cholelithiasis noted.

## 2012-03-13 ENCOUNTER — Other Ambulatory Visit (INDEPENDENT_AMBULATORY_CARE_PROVIDER_SITE_OTHER): Payer: 59

## 2012-03-13 ENCOUNTER — Ambulatory Visit (INDEPENDENT_AMBULATORY_CARE_PROVIDER_SITE_OTHER): Payer: 59 | Admitting: Internal Medicine

## 2012-03-13 ENCOUNTER — Encounter: Payer: Self-pay | Admitting: Internal Medicine

## 2012-03-13 VITALS — BP 122/92 | HR 84 | Temp 98.4°F | Ht 62.0 in | Wt 204.0 lb

## 2012-03-13 DIAGNOSIS — M949 Disorder of cartilage, unspecified: Secondary | ICD-10-CM

## 2012-03-13 DIAGNOSIS — Z0000 Encounter for general adult medical examination without abnormal findings: Secondary | ICD-10-CM

## 2012-03-13 DIAGNOSIS — R091 Pleurisy: Secondary | ICD-10-CM

## 2012-03-13 DIAGNOSIS — R071 Chest pain on breathing: Secondary | ICD-10-CM

## 2012-03-13 DIAGNOSIS — M899 Disorder of bone, unspecified: Secondary | ICD-10-CM

## 2012-03-13 LAB — CBC WITH DIFFERENTIAL/PLATELET
Eosinophils Absolute: 0.3 10*3/uL (ref 0.0–0.7)
Eosinophils Relative: 3.8 % (ref 0.0–5.0)
HCT: 43.8 % (ref 36.0–46.0)
Lymphs Abs: 2.1 10*3/uL (ref 0.7–4.0)
MCHC: 33.1 g/dL (ref 30.0–36.0)
MCV: 87.1 fl (ref 78.0–100.0)
Monocytes Absolute: 0.6 10*3/uL (ref 0.1–1.0)
Neutrophils Relative %: 64.3 % (ref 43.0–77.0)
Platelets: 334 10*3/uL (ref 150.0–400.0)
WBC: 8.4 10*3/uL (ref 4.5–10.5)

## 2012-03-13 LAB — LIPID PANEL
Cholesterol: 210 mg/dL — ABNORMAL HIGH (ref 0–200)
Triglycerides: 102 mg/dL (ref 0.0–149.0)

## 2012-03-13 LAB — BASIC METABOLIC PANEL
BUN: 9 mg/dL (ref 6–23)
CO2: 27 mEq/L (ref 19–32)
Chloride: 105 mEq/L (ref 96–112)
Creatinine, Ser: 0.8 mg/dL (ref 0.4–1.2)
Glucose, Bld: 102 mg/dL — ABNORMAL HIGH (ref 70–99)
Potassium: 4.6 mEq/L (ref 3.5–5.1)

## 2012-03-13 LAB — URINALYSIS, ROUTINE W REFLEX MICROSCOPIC
Bilirubin Urine: NEGATIVE
Hgb urine dipstick: NEGATIVE
Nitrite: NEGATIVE
Total Protein, Urine: NEGATIVE
Urine Glucose: NEGATIVE
Urobilinogen, UA: 0.2 (ref 0.0–1.0)

## 2012-03-13 LAB — HEPATIC FUNCTION PANEL
AST: 17 U/L (ref 0–37)
Total Protein: 7 g/dL (ref 6.0–8.3)

## 2012-03-13 NOTE — Progress Notes (Signed)
Subjective:    Patient ID: Carrie Dalton, female    DOB: 22-Mar-1956, 56 y.o.   MRN: [id number]  HPI patient is here today for annual physical. Patient feels well overall.  Also complains of right-sided chest pain Onset 1 week ago, improved since onset Pain with deep inspiration No shortness of breath or cough/sputum No history of same Denies trauma or recent URI symptoms  Past Medical History  Diagnosis Date  . UNSPECIFIED PAROXYSMAL TACHYCARDIA   . NECK PAIN 11/05/2009  . OSTEOPENIA   . POSITIVE PPD   . SLEEP DISORDER, HX OF   . DEGENERATIVE JOINT DISEASE   . ASTHMA, UNSPECIFIED, UNSPECIFIED STATUS   . HYPERCHOLESTEROLEMIA, BORDERLINE    Family History  Problem Relation Age of Onset  . Diabetes Father   . Hypertension Father   . Stroke Mother   . Atrial fibrillation Mother   . Prostate cancer Maternal Grandfather   . Atrial fibrillation Maternal Grandmother   . Heart failure Maternal Grandmother     CHF  . Hypertension Maternal Grandmother   . Heart disease Daughter     heart transplant  . Pancreatitis Daughter    History  Substance Use Topics  . Smoking status: Former Smoker    Quit date: 05/23/2000  . Smokeless tobacco: Never Used  . Alcohol Use: No    Review of Systems Constitutional: Negative for fever or weight change.  Respiratory: Negative for cough.   Cardiovascular: Negative for palpitations.  Gastrointestinal: Negative for abdominal pain, no bowel changes.  Musculoskeletal: Negative for gait problem or joint swelling.  Skin: Negative for rash.  Neurological: Negative for dizziness or headache.  No other specific complaints in a complete review of systems (except as listed in HPI above).     Objective:   Physical Exam BP 122/92  Pulse 84  Temp 98.4 F (36.9 C) (Oral)  Ht 5\' 2"  (1.575 m)  Wt 204 lb (92.534 kg)  BMI 37.31 kg/m2  SpO2 98% Wt Readings from Last 3 Encounters:  03/13/12 204 lb (92.534 kg)  04/04/11 193 lb (87.544 kg)    09/15/10 199 lb 6.4 oz (90.447 kg)   Constitutional: She is overweight, but appears well-developed and well-nourished. No distress.  HENT: Head: Normocephalic and atraumatic. Ears: B TMs ok, no erythema or effusion; Nose: Nose normal. Mouth/Throat: Oropharynx is clear and moist. No oropharyngeal exudate.  Eyes: Conjunctivae and EOM are normal. Pupils are equal, round, and reactive to light. No scleral icterus.  Neck: Normal range of motion. Neck supple. No JVD or LAD present. No thyromegaly present.  Cardiovascular: Normal rate, regular rhythm and normal heart sounds.  No murmur heard. No BLE edema. Pulmonary/Chest: Effort normal and breath sounds normal. No respiratory distress. She has no wheezes.  Abdominal: Soft. Bowel sounds are normal. She exhibits no distension. There is no tenderness. no masses Musculoskeletal: Normal range of motion, no joint effusions. No Dalton deformities Neurological: She is alert and oriented to person, place, and time. No cranial nerve deficit. Coordination normal.  Skin: Skin is warm and dry. No rash noted. No erythema.  Psychiatric: She has a normal mood and affect. Her behavior is normal. Judgment and thought content normal.   Lab Results  Component Value Date   WBC 14.7* 04/04/2011   HGB 14.8 04/04/2011   HCT 44.2 04/04/2011   PLT 416.0* 04/04/2011   GLUCOSE 105* 04/04/2011   CHOL 177 11/03/2009   TRIG 135.0 11/03/2009   HDL 42.50 11/03/2009   LDLDIRECT 149.7 01/26/2007  LDLCALC 108* 11/03/2009   ALT 22 04/04/2011   AST 21 04/04/2011   NA 143 04/04/2011   K 3.7 04/04/2011   CL 106 04/04/2011   CREATININE 0.9 04/04/2011   BUN 14 04/04/2011   CO2 27 04/04/2011   TSH 1.37 11/03/2009    ECG: sinus @ 83 bpm - LAE, no ischemic changes     Assessment & Plan:  CPX/v70.0 - Patient has been counseled on age-appropriate routine health concerns for screening and prevention. These are reviewed and up-to-date. Immunizations are up-to-date or declined. Labs  and ECG reviewed.  Also R side pleurisy - ECG as above neg for ischemic change - check CXR reassurance and eduction on dx given, as symptoms mostly resolved, no need for specific med tx unless recurrent or worse pain

## 2012-03-13 NOTE — Assessment & Plan Note (Signed)
Unable to tolerate weekly Fosamax because of nausea  Recheck DEXA now and review treatment needs based on these results

## 2012-03-13 NOTE — Patient Instructions (Signed)
It was good to see you today. Health Maintenance reviewed - followup with Dr. Senaida Ores about Pap and pelvic and Dr. Christella Hartigan about colonoscopy screening -all other recommended immunizations and age-appropriate screenings are up-to-date. Test(s) ordered today -labs and chest x-ray. Your results will be released to MyChart (or called to you) after review, usually within 72hours after test completion. If any changes need to be made, you will be notified at that same time. Work on lifestyle changes as discussed (low fat, low carb, increased protein diet; improved exercise efforts; weight loss) to control sugar, blood pressure and cholesterol levels and/or reduce risk of developing other medical problems. Look into [email or web address] or other type of food journal to assist you in this process. We'll schedule DEXA bone density scanning here to followup osteopenia If recurrent chest pain, call further evaluation as needed   Health Maintenance, Females A healthy lifestyle and preventative care can promote health and wellness.  Maintain regular health, dental, and eye exams.   Eat a healthy diet. Foods like vegetables, fruits, whole grains, low-fat dairy products, and lean protein foods contain the nutrients you need without too many calories. Decrease your intake of foods high in solid fats, added sugars, and salt. Get information about a proper diet from your caregiver, if necessary.   Regular physical exercise is one of the most important things you can do for your health. Most adults should get at least 150 minutes of moderate-intensity exercise (any activity that increases your heart rate and causes you to sweat) each week. In addition, most adults need muscle-strengthening exercises on 2 or more days a week.     Maintain a healthy weight. The body mass index (BMI) is a screening tool to identify possible weight problems. It provides an estimate of body fat based on height and weight. Your caregiver can  help determine your BMI, and can help you achieve or maintain a healthy weight. For adults 20 years and older:   A BMI below 18.5 is considered underweight.   A BMI of 18.5 to 24.9 is normal.   A BMI of 25 to 29.9 is considered overweight.   A BMI of 30 and above is considered obese.   Maintain normal blood lipids and cholesterol by exercising and minimizing your intake of saturated fat. Eat a balanced diet with plenty of fruits and vegetables. Blood tests for lipids and cholesterol should begin at age 48 and be repeated every 5 years. If your lipid or cholesterol levels are high, you are over 50, or you are a high risk for heart disease, you may need your cholesterol levels checked more frequently. Ongoing high lipid and cholesterol levels should be treated with medicines if diet and exercise are not effective.   If you smoke, find out from your caregiver how to quit. If you do not use tobacco, do not start.   If you are pregnant, do not drink alcohol. If you are breastfeeding, be very cautious about drinking alcohol. If you are not pregnant and choose to drink alcohol, do not exceed 1 drink per day. One drink is considered to be 12 ounces (355 mL) of beer, 5 ounces (148 mL) of wine, or 1.5 ounces (44 mL) of liquor.   Avoid use of street drugs. Do not share needles with anyone. Ask for help if you need support or instructions about stopping the use of drugs.   High blood pressure causes heart disease and increases the risk of stroke. Blood pressure should be checked at least  every 1 to 2 years. Ongoing high blood pressure should be treated with medicines, if weight loss and exercise are not effective.   If you are 59 to 57 years old, ask your caregiver if you should take aspirin to prevent strokes.   Diabetes screening involves taking a blood sample to check your fasting blood sugar level. This should be done once every 3 years, after age 34, if you are within normal weight and without risk  factors for diabetes. Testing should be considered at a younger age or be carried out more frequently if you are overweight and have at least 1 risk factor for diabetes.   Breast cancer screening is essential preventative care for women. You should practice "breast self-awareness." This means understanding the normal appearance and feel of your breasts and may include breast self-examination. Any changes detected, no matter how small, should be reported to a caregiver. Women in their 36s and 30s should have a clinical breast exam (CBE) by a caregiver as part of a regular health exam every 1 to 3 years. After age 42, women should have a CBE every year. Starting at age 27, women should consider having a mammogram (breast X-ray) every year. Women who have a family history of breast cancer should talk to their caregiver about genetic screening. Women at a high risk of breast cancer should talk to their caregiver about having an MRI and a mammogram every year.   The Pap test is a screening test for cervical cancer. Women should have a Pap test starting at age 88. Between ages 102 and 66, Pap tests should be repeated every 2 years. Beginning at age 4, you should have a Pap test every 3 years as long as the past 3 Pap tests have been normal. If you had a hysterectomy for a problem that was not cancer or a condition that could lead to cancer, then you no longer need Pap tests. If you are between ages 48 and 33, and you have had normal Pap tests going back 10 years, you no longer need Pap tests. If you have had past treatment for cervical cancer or a condition that could lead to cancer, you need Pap tests and screening for cancer for at least 20 years after your treatment. If Pap tests have been discontinued, risk factors (such as a new sexual partner) need to be reassessed to determine if screening should be resumed. Some women have medical problems that increase the chance of getting cervical cancer. In these cases,  your caregiver may recommend more frequent screening and Pap tests.   The human papillomavirus (HPV) test is an additional test that may be used for cervical cancer screening. The HPV test looks for the virus that can cause the cell changes on the cervix. The cells collected during the Pap test can be tested for HPV. The HPV test could be used to screen women aged 62 years and older, and should be used in women of any age who have unclear Pap test results. After the age of 3, women should have HPV testing at the same frequency as a Pap test.   Colorectal cancer can be detected and often prevented. Most routine colorectal cancer screening begins at the age of 66 and continues through age 52. However, your caregiver may recommend screening at an earlier age if you have risk factors for colon cancer. On a yearly basis, your caregiver may provide home test kits to check for hidden blood in the stool. Use  of a small camera at the end of a tube, to directly examine the colon (sigmoidoscopy or colonoscopy), can detect the earliest forms of colorectal cancer. Talk to your caregiver about this at age 38, when routine screening begins. Direct examination of the colon should be repeated every 5 to 10 years through age 20, unless early forms of pre-cancerous polyps or small growths are found.   Hepatitis C blood testing is recommended for all people born from [birth year] through 1966 and any individual with known risks for hepatitis C.   Practice safe sex. Use condoms and avoid high-risk sexual practices to reduce the spread of sexually transmitted infections (STIs). Sexually active women aged 55 and younger should be checked for Chlamydia, which is a common sexually transmitted infection. Older women with new or multiple partners should also be tested for Chlamydia. Testing for other STIs is recommended if you are sexually active and at increased risk.   Osteoporosis is a disease in which the bones lose minerals and  strength with aging. This can result in serious bone fractures. The risk of osteoporosis can be identified using a bone density scan. Women ages 75 and over and women at risk for fractures or osteoporosis should discuss screening with their caregivers. Ask your caregiver whether you should be taking a calcium supplement or vitamin D to reduce the rate of osteoporosis.   Menopause can be associated with physical symptoms and risks. Hormone replacement therapy is available to decrease symptoms and risks. You should talk to your caregiver about whether hormone replacement therapy is right for you.   Use sunscreen with a sun protection factor (SPF) of 30 or greater. Apply sunscreen liberally and repeatedly throughout the day. You should seek shade when your shadow is shorter than you. Protect yourself by wearing long sleeves, pants, a wide-brimmed hat, and sunglasses year round, whenever you are outdoors.   Notify your caregiver of new moles or changes in moles, especially if there is a change in shape or color. Also notify your caregiver if a mole is larger than the size of a pencil eraser.   Stay current with your immunizations.  Document Released: 11/22/2010 Document Revised: 08/01/2011 Document Reviewed: 11/22/2010 [facility] Patient Information 2013 [place], [place].   Pleurisy Pleurisy is an inflammation and swelling of the lining of the lungs. It usually is the result of an underlying infection or other disease. Because of this inflammation, it hurts to breathe. It is aggravated by coughing or deep breathing. The primary goal in treating pleurisy is to diagnose and treat the condition that caused it.   HOME CARE INSTRUCTIONS    Only take over-the-counter or prescription medicines for pain, discomfort, or fever as directed by your caregiver.   If medications which kill germs (antibiotics) were prescribed, take the entire course. Even if you are feeling better, you need to take them.   Use a cool  mist vaporizer to help loosen secretions. This is so the secretions can be coughed up more easily.  SEEK MEDICAL CARE IF:    Your pain is not controlled with medication or is increasing.   You have an increase inpus like (purulent) secretions brought up with coughing.  SEEK IMMEDIATE MEDICAL CARE IF:    You have blue or dark lips, fingernails, or toenails.   You begin coughing up blood.   You have increased difficulty breathing.   You have continuing pain unrelieved by medicine or lasting more than 1 week.   You have pain that radiates into  your neck, arms, or jaw.   You develop increased shortness of breath or wheezing.   You develop a fever, rash, vomiting, fainting, or other serious complaints.  Document Released: 05/09/2005 Document Revised: 08/01/2011 Document Reviewed: 12/08/2006 [facility] Patient Information 2013 [place], [place].

## 2012-03-14 ENCOUNTER — Ambulatory Visit (INDEPENDENT_AMBULATORY_CARE_PROVIDER_SITE_OTHER)
Admission: RE | Admit: 2012-03-14 | Discharge: 2012-03-14 | Disposition: A | Discharge status: Home / Self Care | Payer: 59 | Source: Ambulatory Visit | Attending: Internal Medicine | Admitting: Internal Medicine

## 2012-03-14 ENCOUNTER — Ambulatory Visit (INDEPENDENT_AMBULATORY_CARE_PROVIDER_SITE_OTHER)
Admission: RE | Admit: 2012-03-14 | Discharge: 2012-03-14 | Disposition: A | Discharge status: Home / Self Care | Payer: 59 | Source: Ambulatory Visit

## 2012-03-14 ENCOUNTER — Other Ambulatory Visit: Payer: Self-pay | Admitting: Internal Medicine

## 2012-03-14 DIAGNOSIS — R071 Chest pain on breathing: Secondary | ICD-10-CM

## 2012-03-14 DIAGNOSIS — M949 Disorder of cartilage, unspecified: Secondary | ICD-10-CM

## 2012-03-14 DIAGNOSIS — R091 Pleurisy: Secondary | ICD-10-CM

## 2012-03-14 DIAGNOSIS — M899 Disorder of bone, unspecified: Secondary | ICD-10-CM

## 2012-03-26 ENCOUNTER — Encounter: Payer: Self-pay | Admitting: Internal Medicine

## 2012-03-27 ENCOUNTER — Telehealth: Payer: Self-pay | Admitting: *Deleted

## 2012-03-27 DIAGNOSIS — M949 Disorder of cartilage, unspecified: Secondary | ICD-10-CM

## 2012-03-27 DIAGNOSIS — M899 Disorder of bone, unspecified: Secondary | ICD-10-CM

## 2012-03-27 MED ORDER — IBANDRONATE SODIUM 150 MG PO TABS: 150.0000 mg | ORAL_TABLET | ORAL | Status: DC

## 2012-03-27 NOTE — Telephone Encounter (Signed)
Called pt concerning bone density. Pt agreed to start boniva sending rx to [facility]...Raechel Chute

## 2012-03-28 NOTE — Telephone Encounter (Signed)
Very good thanks.

## 2012-04-05 ENCOUNTER — Telehealth: Payer: Self-pay | Admitting: Internal Medicine

## 2012-04-05 MED ORDER — ALPRAZOLAM 0.25 MG PO TABS: ORAL_TABLET | ORAL | Status: DC

## 2012-04-05 NOTE — Telephone Encounter (Signed)
Alprazolam 0.25 mg <> take 1-2 tablets by mouth tid if needed  #60  Last fill was 02-10-12  Allergies  Allergen Reactions  . Compazine   . Prochlorperazine Edisylate    Dr Maple Hudson please advise  Thank you

## 2012-04-05 NOTE — Telephone Encounter (Signed)
Ok to refill 

## 2012-04-05 NOTE — Telephone Encounter (Signed)
rx called in

## 2012-05-11 ENCOUNTER — Telehealth: Payer: Self-pay | Admitting: Internal Medicine

## 2012-05-11 MED ORDER — AZITHROMYCIN 250 MG PO TABS: 250.0000 mg | ORAL_TABLET | Freq: Once | ORAL | Status: DC

## 2012-05-11 NOTE — Telephone Encounter (Signed)
Per CY-okay to send RX as requested. Pt is aware Rx sent.

## 2012-06-14 ENCOUNTER — Other Ambulatory Visit: Payer: Self-pay | Admitting: *Deleted

## 2012-06-14 ENCOUNTER — Other Ambulatory Visit: Payer: Self-pay | Admitting: Internal Medicine

## 2012-06-14 MED ORDER — PANTOPRAZOLE SODIUM 40 MG PO TBEC: 40.0000 mg | DELAYED_RELEASE_TABLET | Freq: Every day | ORAL | Status: DC

## 2012-06-14 NOTE — Telephone Encounter (Signed)
Received fax nexium is no longer $0 copay through insured employees through [employer]. copay now is $25 as of January 1st. Protonix is now available with $ 0 copay. We are asking md to change due to patient therapy to reduce their out-of-pocket cost. MD ok sending rx for protonix...Raechel Chute

## 2012-08-21 ENCOUNTER — Other Ambulatory Visit: Payer: Self-pay | Admitting: Internal Medicine

## 2012-08-21 MED ORDER — PROMETHAZINE HCL 12.5 MG PO TABS: 12.5000 mg | ORAL_TABLET | Freq: Three times a day (TID) | ORAL | Status: DC | PRN

## 2012-08-21 MED ORDER — LORAZEPAM 1 MG PO TABS: 1.0000 mg | ORAL_TABLET | Freq: Three times a day (TID) | ORAL | Status: DC | PRN

## 2012-09-03 ENCOUNTER — Telehealth: Payer: Self-pay | Admitting: Internal Medicine

## 2012-09-03 MED ORDER — PROMETHAZINE HCL 12.5 MG PO TABS: 12.5000 mg | ORAL_TABLET | Freq: Two times a day (BID) | ORAL | Status: DC | PRN

## 2012-09-03 MED ORDER — LORAZEPAM 1 MG PO TABS: 1.0000 mg | ORAL_TABLET | Freq: Three times a day (TID) | ORAL | Status: DC | PRN

## 2012-09-03 NOTE — Telephone Encounter (Signed)
Per CY-okay to call Rx's to [facility] pharmacy as follows: Lorazepam 1 mg #20 take 1 po every 8 hours prn no refills and Phenergan 12.5 mg #10 take 1 po BID prn nausea no refills. I called Rx's to pharmacy and patient is aware.

## 2012-09-10 ENCOUNTER — Telehealth: Payer: Self-pay | Admitting: Internal Medicine

## 2012-09-10 ENCOUNTER — Other Ambulatory Visit: Payer: Self-pay | Admitting: Internal Medicine

## 2012-09-10 MED ORDER — PROMETHAZINE HCL 12.5 MG PO TABS: 12.5000 mg | ORAL_TABLET | Freq: Two times a day (BID) | ORAL | Status: DC | PRN

## 2012-09-10 NOTE — Telephone Encounter (Signed)
Continues grief after death of daughter. Stress causes nausea. Can't eat, Can't sleep. We discussed. She understands this will take time, and not solved with pills. Using either xanax or lorazepam prn. Careful with meds. No suicidal or self harm risk indicated.  I agreed to refill phenergan.

## 2012-10-12 ENCOUNTER — Other Ambulatory Visit: Payer: Self-pay | Admitting: Internal Medicine

## 2012-10-12 NOTE — Telephone Encounter (Signed)
Please advise if okay to refill. Thanks.  

## 2012-10-12 NOTE — Telephone Encounter (Signed)
Ok to refill 

## 2012-12-17 ENCOUNTER — Other Ambulatory Visit: Payer: Self-pay | Admitting: Internal Medicine

## 2012-12-17 NOTE — Telephone Encounter (Signed)
Please advise if okay to refill. Thanks.  

## 2012-12-17 NOTE — Telephone Encounter (Signed)
Ok to refill 

## 2013-01-03 ENCOUNTER — Ambulatory Visit (INDEPENDENT_AMBULATORY_CARE_PROVIDER_SITE_OTHER): Payer: 59 | Admitting: Internal Medicine

## 2013-01-03 ENCOUNTER — Encounter: Payer: Self-pay | Admitting: Internal Medicine

## 2013-01-03 VITALS — BP 138/85 | HR 72 | Ht 62.0 in

## 2013-01-03 DIAGNOSIS — F5104 Psychophysiologic insomnia: Secondary | ICD-10-CM

## 2013-01-03 DIAGNOSIS — G4700 Insomnia, unspecified: Secondary | ICD-10-CM

## 2013-01-03 MED ORDER — CLONAZEPAM 0.5 MG PO TABS: ORAL_TABLET | ORAL | Status: DC

## 2013-01-03 NOTE — Progress Notes (Signed)
01/03/13- 65 yoF former smoker followed for chronic insomnia, complicated by hx asthma, PAT, +PPD FOLLOWS FOR: continues to have trouble falling asleep and staying asleep. Waking every hour. Daughter died earlier this year, 10 years after heart transplant. Has used Halcion and clonazepam in recent years. Gradually developing tolerance, or her stress level is worse. Using Xanax 0.25 mg 3 times daily if needed, which helps her during the day. Lunesta did not work well. Not known to snore but in separate bedrooms.  ROS-see HPI Constitutional:   No-   weight loss, night sweats, fevers, chills, fatigue, lassitude. HEENT:   No-  headaches, difficulty swallowing, tooth/dental problems, sore throat,       No-  sneezing, itching, ear ache, nasal congestion, post nasal drip,  CV:  No-   chest pain, orthopnea, PND, swelling in lower extremities, anasarca, dizziness, palpitations Resp: No-   shortness of breath with exertion or at rest.              No-   productive cough,  No non-productive cough,  No- coughing up of blood.              No-   change in color of mucus.  No- wheezing.   Skin: No-   rash or lesions. GI:  No-   heartburn, indigestion, abdominal pain, nausea, vomiting, GU:. MS:  No-   joint pain or swelling.  No- decreased range of motion.  Some- back pain. Neuro-     nothing unusual Psych:  No- change in mood or affect. + depression or anxiety.  No memory loss.  OBJ- Physical Exam General- Alert, Oriented, Affect-appropriate, Distress- none acute Skin- rash-none, lesions- none, excoriation- none Lymphadenopathy- none Head- atraumatic            Eyes- Gross vision intact, PERRLA, conjunctivae and secretions clear            Ears- Hearing, canals-normal            Nose- Clear, no-Septal dev, mucus, polyps, erosion, perforation             Throat- Mallampati III , mucosa clear , drainage- none, tonsils- atrophic Neck- flexible , trachea midline, no stridor , thyroid nl, carotid no  bruit Chest - symmetrical excursion , unlabored           Heart/CV- RRR , no murmur , no gallop  , no rub, nl s1 s2                           - JVD- none , edema- none, stasis changes- none, varices- none           Lung- clear to P&A, wheeze- none, cough- none , dullness-none, rub- none           Chest wall-  Abd-  Br/ Gen/ Rectal- Not done, not indicated Extrem- cyanosis- none, clubbing, none, atrophy- none, strength- nl Neuro- grossly intact to observation

## 2013-01-03 NOTE — Patient Instructions (Addendum)
We are trying clonazepam  Start with one, a little before bedtime. Increase, night by night, up to 3 per night, as needed

## 2013-01-17 ENCOUNTER — Encounter: Payer: Self-pay | Admitting: *Deleted

## 2013-01-21 NOTE — Assessment & Plan Note (Signed)
Educated to watch for sleep apnea. We discussed good sleep hygiene and some medication options. Since she has clonazepam, she is going to try increasing as high as 3 (1.5 mg) taken one half hour before bedtime. She will let me know about her experience with this, but she can supplement with Halcion to help sleep initiation without increasing medication load as she tries to wake up.

## 2013-02-08 ENCOUNTER — Other Ambulatory Visit: Payer: Self-pay | Admitting: Internal Medicine

## 2013-02-08 NOTE — Telephone Encounter (Signed)
Ok to refill 

## 2013-02-08 NOTE — Telephone Encounter (Signed)
Please advise if okay to refill. Thanks.  

## 2013-02-11 NOTE — Telephone Encounter (Signed)
Called refill to pharmacy voicemail.  

## 2013-06-13 ENCOUNTER — Telehealth: Payer: Self-pay | Admitting: Internal Medicine

## 2013-06-13 MED ORDER — OSELTAMIVIR PHOSPHATE 75 MG PO CAPS: 75.0000 mg | ORAL_CAPSULE | Freq: Two times a day (BID) | ORAL | Status: DC

## 2013-06-13 NOTE — Telephone Encounter (Signed)
Exposed to grandchildren w/ flu P- [facility]iflu [facility]

## 2013-06-26 ENCOUNTER — Telehealth: Payer: Self-pay | Admitting: Internal Medicine

## 2013-06-26 MED ORDER — ESCITALOPRAM OXALATE 10 MG PO TABS: 10.0000 mg | ORAL_TABLET | Freq: Every day | ORAL | Status: DC

## 2013-06-26 NOTE — Telephone Encounter (Signed)
Erroneous

## 2013-06-26 NOTE — Telephone Encounter (Signed)
Asks to return to Lexapro. Still grieving daughter's death.

## 2013-06-27 ENCOUNTER — Telehealth: Payer: Self-pay | Admitting: Internal Medicine

## 2013-06-27 MED ORDER — LORAZEPAM 1 MG PO TABS: 1.0000 mg | ORAL_TABLET | Freq: Three times a day (TID) | ORAL | Status: DC | PRN

## 2013-06-27 NOTE — Telephone Encounter (Signed)
Pt aware of meds updated and rx called to pharmacy. I have updated EPIC medication list as well.

## 2013-07-17 ENCOUNTER — Other Ambulatory Visit: Payer: Self-pay | Admitting: Internal Medicine

## 2013-07-17 ENCOUNTER — Telehealth: Payer: Self-pay | Admitting: Internal Medicine

## 2013-07-17 MED ORDER — CLONAZEPAM 0.5 MG PO TABS: ORAL_TABLET | ORAL | Status: DC

## 2013-07-17 MED ORDER — TRIAZOLAM 0.25 MG PO TABS: ORAL_TABLET | ORAL | Status: DC

## 2013-07-17 NOTE — Telephone Encounter (Signed)
Refill for Clonazepam and Triazolam [facility] per Dr Maple HudsonYoung

## 2013-08-16 ENCOUNTER — Telehealth: Payer: Self-pay | Admitting: Internal Medicine

## 2013-08-16 MED ORDER — TRIAZOLAM 0.25 MG PO TABS: 0.2500 mg | ORAL_TABLET | Freq: Every evening | ORAL | Status: DC | PRN

## 2013-08-16 MED ORDER — CLONAZEPAM 0.5 MG PO TABS: ORAL_TABLET | ORAL | Status: DC

## 2013-08-16 NOTE — Telephone Encounter (Signed)
[facility] not [facility] before it closed today to get sleep meds refilled. Plan- Call in to [facility] [address] enough to get through weekend.

## 2013-10-11 ENCOUNTER — Telehealth: Payer: Self-pay | Admitting: Internal Medicine

## 2013-10-11 MED ORDER — ALPRAZOLAM 0.25 MG PO TABS: ORAL_TABLET | ORAL | Status: DC

## 2013-10-11 NOTE — Telephone Encounter (Signed)
Per CY-okay to refill as requested and patient is aware. Nothing more is needed at this time.

## 2013-10-21 ENCOUNTER — Telehealth: Payer: Self-pay | Admitting: Internal Medicine

## 2013-10-21 MED ORDER — CLONAZEPAM 0.5 MG PO TABS: ORAL_TABLET | ORAL | Status: DC

## 2013-10-21 MED ORDER — TRIAZOLAM 0.25 MG PO TABS: 0.2500 mg | ORAL_TABLET | Freq: Every evening | ORAL | Status: DC | PRN

## 2013-10-21 NOTE — Telephone Encounter (Signed)
Per CY send in refills for pt on klonopin and halcion x 5 refills. Refills called in. Carron Curie, CMA

## 2013-10-31 ENCOUNTER — Other Ambulatory Visit: Payer: Self-pay | Admitting: Internal Medicine

## 2013-10-31 MED ORDER — CLONAZEPAM 0.5 MG PO TABS: ORAL_TABLET | ORAL | Status: DC

## 2013-11-05 ENCOUNTER — Ambulatory Visit (INDEPENDENT_AMBULATORY_CARE_PROVIDER_SITE_OTHER): Payer: 59 | Admitting: Internal Medicine

## 2013-11-05 ENCOUNTER — Other Ambulatory Visit (INDEPENDENT_AMBULATORY_CARE_PROVIDER_SITE_OTHER): Payer: 59

## 2013-11-05 ENCOUNTER — Encounter: Payer: Self-pay | Admitting: Internal Medicine

## 2013-11-05 VITALS — BP 118/92 | HR 89 | Temp 98.3°F | Ht 62.0 in | Wt 197.4 lb

## 2013-11-05 DIAGNOSIS — Z0000 Encounter for general adult medical examination without abnormal findings: Secondary | ICD-10-CM

## 2013-11-05 DIAGNOSIS — Z124 Encounter for screening for malignant neoplasm of cervix: Secondary | ICD-10-CM

## 2013-11-05 DIAGNOSIS — Z1211 Encounter for screening for malignant neoplasm of colon: Secondary | ICD-10-CM

## 2013-11-05 DIAGNOSIS — Z1239 Encounter for other screening for malignant neoplasm of breast: Secondary | ICD-10-CM

## 2013-11-05 DIAGNOSIS — Z23 Encounter for immunization: Secondary | ICD-10-CM

## 2013-11-05 LAB — BASIC METABOLIC PANEL
BUN: 9 mg/dL (ref 6–23)
CHLORIDE: 109 meq/L (ref 96–112)
CO2: 28 mEq/L (ref 19–32)
CREATININE: 0.8 mg/dL (ref 0.4–1.2)
Calcium: 9.2 mg/dL (ref 8.4–10.5)
GFR: 79.22 mL/min (ref >60.00)
Glucose, Bld: 119 mg/dL — ABNORMAL HIGH (ref 70–99)
Potassium: 4.5 mEq/L (ref 3.5–5.1)
SODIUM: 141 meq/L (ref 135–145)

## 2013-11-05 LAB — CBC WITH DIFFERENTIAL/PLATELET
BASOS PCT: 0.6 % (ref 0.0–3.0)
Basophils Absolute: 0 10*3/uL (ref 0.0–0.1)
Eosinophils Absolute: 0.9 10*3/uL — ABNORMAL HIGH (ref 0.0–0.7)
Eosinophils Relative: 11.1 % — ABNORMAL HIGH (ref 0.0–5.0)
HCT: 42.2 % (ref 36.0–46.0)
Hemoglobin: 14.1 g/dL (ref 12.0–15.0)
LYMPHS PCT: 21.4 % (ref 12.0–46.0)
Lymphs Abs: 1.6 10*3/uL (ref 0.7–4.0)
MCHC: 33.3 g/dL (ref 30.0–36.0)
MCV: 88 fl (ref 78.0–100.0)
Monocytes Absolute: 0.5 10*3/uL (ref 0.1–1.0)
Monocytes Relative: 6.9 % (ref 3.0–12.0)
NEUTROS PCT: 60 % (ref 43.0–77.0)
Neutro Abs: 4.6 10*3/uL (ref 1.4–7.7)
Platelets: 275 10*3/uL (ref 150.0–400.0)
RBC: 4.8 Mil/uL (ref 3.87–5.11)
RDW: 13.1 % (ref 11.5–15.5)
WBC: 7.7 10*3/uL (ref 4.0–10.5)

## 2013-11-05 LAB — URINALYSIS, ROUTINE W REFLEX MICROSCOPIC
Bilirubin Urine: NEGATIVE
Hgb urine dipstick: NEGATIVE
Ketones, ur: NEGATIVE
LEUKOCYTES UA: NEGATIVE
Nitrite: NEGATIVE
RBC / HPF: NONE SEEN (ref 0–?)
Total Protein, Urine: NEGATIVE
UROBILINOGEN UA: 0.2 (ref 0.0–1.0)
Urine Glucose: NEGATIVE
WBC, UA: NONE SEEN (ref 0–?)
pH: 5.5 (ref 5.0–8.0)

## 2013-11-05 LAB — HEPATIC FUNCTION PANEL
ALK PHOS: 71 U/L (ref 39–117)
ALT: 12 U/L (ref 0–35)
AST: 17 U/L (ref 0–37)
Albumin: 4 g/dL (ref 3.5–5.2)
Bilirubin, Direct: 0.1 mg/dL (ref 0.0–0.3)
TOTAL PROTEIN: 6.6 g/dL (ref 6.0–8.3)
Total Bilirubin: 0.2 mg/dL (ref 0.2–1.2)

## 2013-11-05 LAB — LIPID PANEL
Cholesterol: 191 mg/dL (ref 0–200)
HDL: 41.9 mg/dL (ref >39.00)
LDL Cholesterol: 129 mg/dL — ABNORMAL HIGH (ref 0–99)
NONHDL: 149.1
Total CHOL/HDL Ratio: 5
Triglycerides: 99 mg/dL (ref 0.0–149.0)
VLDL: 19.8 mg/dL (ref 0.0–40.0)

## 2013-11-05 LAB — TSH: TSH: 0.68 u[IU]/mL (ref 0.35–4.50)

## 2013-11-05 MED ORDER — NEBIVOLOL HCL 20 MG PO TABS: 1.0000 | ORAL_TABLET | Freq: Every day | ORAL | Status: DC

## 2013-11-05 MED ORDER — CYCLOBENZAPRINE HCL 10 MG PO TABS: ORAL_TABLET | ORAL | Status: DC

## 2013-11-05 MED ORDER — PANTOPRAZOLE SODIUM 40 MG PO TBEC: 40.0000 mg | DELAYED_RELEASE_TABLET | Freq: Every day | ORAL | Status: DC

## 2013-11-05 NOTE — Progress Notes (Signed)
Subjective:    Patient ID: Carrie Dalton, female    DOB: November 08, 1955, 58 y.o.   MRN: [id number]  HPI  patient is here today for annual physical. Patient feels well in general   Also reviewed chronic medical issues and interval medical events:  death of 59 yo daughter Delice Bisonara 08/20/12 (cardiac transplant pt)  Past Medical History  Diagnosis Date  . UNSPECIFIED PAROXYSMAL TACHYCARDIA   . NECK PAIN 11/05/2009  . OSTEOPENIA   . POSITIVE PPD   . SLEEP DISORDER, HX OF   . DEGENERATIVE JOINT DISEASE   . ASTHMA, UNSPECIFIED, UNSPECIFIED STATUS   . HYPERCHOLESTEROLEMIA, BORDERLINE    Family History  Problem Relation Age of Onset  . Diabetes Father   . Hypertension Father   . Stroke Mother   . Atrial fibrillation Mother   . Prostate cancer Maternal Grandfather   . Atrial fibrillation Maternal Grandmother   . Heart failure Maternal Grandmother     CHF  . Hypertension Maternal Grandmother   . Heart disease Daughter     heart transplant  . Pancreatitis Daughter    History  Substance Use Topics  . Smoking status: Former Smoker    Quit date: 05/23/2000  . Smokeless tobacco: Never Used  . Alcohol Use: No    Review of Systems  Constitutional: Negative for fatigue and unexpected weight change.  Respiratory: Negative for cough, shortness of breath and wheezing.   Cardiovascular: Negative for chest pain, palpitations and leg swelling.  Gastrointestinal: Negative for nausea, abdominal pain and diarrhea.  Genitourinary: Positive for dysuria (and dark urine).  Neurological: Negative for dizziness, weakness, light-headedness and headaches.  Psychiatric/Behavioral: Negative for dysphoric mood. The patient is not nervous/anxious.   All other systems reviewed and are negative.      Objective:   Physical Exam  BP 118/92  Pulse 89  Temp(Src) 98.3 F (36.8 C) (Oral)  Ht 5\' 2"  (1.575 m)  Wt 197 lb 6.4 oz (89.54 kg)  BMI 36.10 kg/m2  SpO2 95% Wt Readings from Last 3 Encounters:    11/05/13 197 lb 6.4 oz (89.54 kg)  03/13/12 204 lb (92.534 kg)  04/04/11 193 lb (87.544 kg)   Constitutional: She is overweight, but appears well-developed and well-nourished. No distress.  HENT: Head: Normocephalic and atraumatic. Ears: B TMs ok, no erythema or effusion; Nose: Nose normal. Mouth/Throat: Oropharynx is clear and moist. No oropharyngeal exudate.  Eyes: Conjunctivae and EOM are normal. Pupils are equal, round, and reactive to light. No scleral icterus.  Neck: Normal range of motion. Neck supple. No JVD present. No thyromegaly present.  Cardiovascular: Normal rate, regular rhythm and normal heart sounds.  No murmur heard. No BLE edema. Pulmonary/Chest: Effort normal and breath sounds normal. No respiratory distress. She has no wheezes.  Abdominal: Soft. Bowel sounds are normal. She exhibits no distension. There is no tenderness. no masses GU/breast - defer to gyn Musculoskeletal: Normal range of motion, no joint effusions. No gross deformities Neurological: She is alert and oriented to person, place, and time. No cranial nerve deficit. Coordination, balance, strength, speech and gait are normal.  Skin: Skin is warm and dry. No rash noted. No erythema.  Psychiatric: She has a normal mood and affect. Her behavior is normal. Judgment and thought content normal.    Lab Results  Component Value Date   WBC 8.4 03/13/2012   HGB 14.5 03/13/2012   HCT 43.8 03/13/2012   PLT 334.0 03/13/2012   GLUCOSE 102* 03/13/2012   CHOL 210* 03/13/2012  TRIG 102.0 03/13/2012   HDL 37.30* 03/13/2012   LDLDIRECT 161.1 03/13/2012   LDLCALC 108* 11/03/2009   ALT 18 03/13/2012   AST 17 03/13/2012   NA 138 03/13/2012   K 4.6 03/13/2012   CL 105 03/13/2012   CREATININE 0.8 03/13/2012   BUN 9 03/13/2012   CO2 27 03/13/2012   TSH 1.48 03/13/2012    Dg Chest 2 View  03/14/2012   *RADIOLOGY REPORT*  Clinical Data: Chest pain and right-sided pleurisy.  CHEST - 2 VIEW  Comparison: 11/05/2009   Findings: Midline trachea.  Normal heart size and mediastinal contours. No pleural effusion or pneumothorax.  Clear lungs.  IMPRESSION: No acute cardiopulmonary disease.   Original Report Authenticated By: Consuello BossierKYLE D. TALBOT, M.D.   Dg Bone Density  03/25/2012   Osteopenia with lowest T score of -2.4 at femoral neck    ECG:  Sinus @ 80 bpm - no ischemic changes or arrythmia    Assessment & Plan:   CPX/v70.0 - Patient has been counseled on age-appropriate routine health concerns for screening and prevention. These are reviewed and up-to-date. Immunizations are up-to-date or declined. Labs and ECG ordered and reviewed.  Refer for colonoscopy screening, mammogram and gynecology followup Pneumovax administered today because of asthma history

## 2013-11-05 NOTE — Progress Notes (Signed)
Pre visit review using our clinic review tool, if applicable. No additional management support is needed unless otherwise documented below in the visit note. 

## 2013-11-05 NOTE — Patient Instructions (Addendum)
It was good to see you today.  We have reviewed your prior records including labs and tests today  Health Maintenance reviewed - will refer for gynecology followup, mammogram screening and colonoscopy. Pneumovax updated today because of your history of asthma. All other recommended immunizations and age-appropriate screenings are up-to-date.  Test(s) ordered today. Your results will be released to [facility] (or called to you) after review, usually within 72hours after test completion. If any changes need to be made, you will be notified at that same time.  Medications reviewed and updated, no changes recommended at this time. Refill on medication(s) as discussed today.  Please schedule followup in 12 months for annual exam and labs, call sooner if problems.   Health Maintenance, Female A healthy lifestyle and preventative care can promote health and wellness.  Maintain regular health, dental, and eye exams.  Eat a healthy diet. Foods like vegetables, fruits, whole grains, low-fat dairy products, and lean protein foods contain the nutrients you need without too many calories. Decrease your intake of foods high in solid fats, added sugars, and salt. Get information about a proper diet from your caregiver, if necessary.  Regular physical exercise is one of the most important things you can do for your health. Most adults should get at least 150 minutes of moderate-intensity exercise (any activity that increases your heart rate and causes you to sweat) each week. In addition, most adults need muscle-strengthening exercises on 2 or more days a week.   Maintain a healthy weight. The body mass index (BMI) is a screening tool to identify possible weight problems. It provides an estimate of body fat based on height and weight. Your caregiver can help determine your BMI, and can help you achieve or maintain a healthy weight. For adults 20 years and older:  A BMI below 18.5 is considered  underweight.  A BMI of 18.5 to 24.9 is normal.  A BMI of 25 to 29.9 is considered overweight.  A BMI of 30 and above is considered obese.  Maintain normal blood lipids and cholesterol by exercising and minimizing your intake of saturated fat. Eat a balanced diet with plenty of fruits and vegetables. Blood tests for lipids and cholesterol should begin at age 21 and be repeated every 5 years. If your lipid or cholesterol levels are high, you are over 50, or you are a high risk for heart disease, you may need your cholesterol levels checked more frequently.Ongoing high lipid and cholesterol levels should be treated with medicines if diet and exercise are not effective.  If you smoke, find out from your caregiver how to quit. If you do not use tobacco, do not start.  Lung cancer screening is recommended for adults aged 54 80 years who are at high risk for developing lung cancer because of a history of smoking. Yearly low-dose computed tomography (CT) is recommended for people who have at least a 30-pack-year history of smoking and are a current smoker or have quit within the past 15 years. A pack year of smoking is smoking an average of 1 pack of cigarettes a day for 1 year (for example: 1 pack a day for 30 years or 2 packs a day for 15 years). Yearly screening should continue until the smoker has stopped smoking for at least 15 years. Yearly screening should also be stopped for people who develop a health problem that would prevent them from having lung cancer treatment.  If you are pregnant, do not drink alcohol. If you are  breastfeeding, be very cautious about drinking alcohol. If you are not pregnant and choose to drink alcohol, do not exceed 1 drink per day. One drink is considered to be 12 ounces (355 mL) of beer, 5 ounces (148 mL) of wine, or 1.5 ounces (44 mL) of liquor.  Avoid use of street drugs. Do not share needles with anyone. Ask for help if you need support or instructions about stopping  the use of drugs.  High blood pressure causes heart disease and increases the risk of stroke. Blood pressure should be checked at least every 1 to 2 years. Ongoing high blood pressure should be treated with medicines, if weight loss and exercise are not effective.  If you are 55 to 59 years old, ask your caregiver if you should take aspirin to prevent strokes.  Diabetes screening involves taking a blood sample to check your fasting blood sugar level. This should be done once every 3 years, after age 45, if you are within normal weight and without risk factors for diabetes. Testing should be considered at a younger age or be carried out more frequently if you are overweight and have at least 1 risk factor for diabetes.  Breast cancer screening is essential preventative care for women. You should practice "breast self-awareness." This means understanding the normal appearance and feel of your breasts and may include breast self-examination. Any changes detected, no matter how small, should be reported to a caregiver. Women in their 20s and 30s should have a clinical breast exam (CBE) by a caregiver as part of a regular health exam every 1 to 3 years. After age 40, women should have a CBE every year. Starting at age 40, women should consider having a mammogram (breast X-ray) every year. Women who have a family history of breast cancer should talk to their caregiver about genetic screening. Women at a high risk of breast cancer should talk to their caregiver about having an MRI and a mammogram every year.  Breast cancer gene (BRCA)-related cancer risk assessment is recommended for women who have family members with BRCA-related cancers. BRCA-related cancers include breast, ovarian, tubal, and peritoneal cancers. Having family members with these cancers may be associated with an increased risk for harmful changes (mutations) in the breast cancer genes BRCA1 and BRCA2. Results of the assessment will determine  the need for genetic counseling and BRCA1 and BRCA2 testing.  The Pap test is a screening test for cervical cancer. Women should have a Pap test starting at age 21. Between ages 21 and 29, Pap tests should be repeated every 2 years. Beginning at age 30, you should have a Pap test every 3 years as long as the past 3 Pap tests have been normal. If you had a hysterectomy for a problem that was not cancer or a condition that could lead to cancer, then you no longer need Pap tests. If you are between ages 65 and 70, and you have had normal Pap tests going back 10 years, you no longer need Pap tests. If you have had past treatment for cervical cancer or a condition that could lead to cancer, you need Pap tests and screening for cancer for at least 20 years after your treatment. If Pap tests have been discontinued, risk factors (such as a new sexual partner) need to be reassessed to determine if screening should be resumed. Some women have medical problems that increase the chance of getting cervical cancer. In these cases, your caregiver may recommend more frequent screening   and Pap tests.  The human papillomavirus (HPV) test is an additional test that may be used for cervical cancer screening. The HPV test looks for the virus that can cause the cell changes on the cervix. The cells collected during the Pap test can be tested for HPV. The HPV test could be used to screen women aged 85 years and older, and should be used in women of any age who have unclear Pap test results. After the age of 44, women should have HPV testing at the same frequency as a Pap test.  Colorectal cancer can be detected and often prevented. Most routine colorectal cancer screening begins at the age of 62 and continues through age 33. However, your caregiver may recommend screening at an earlier age if you have risk factors for colon cancer. On a yearly basis, your caregiver may provide home test kits to check for hidden blood in the stool.  Use of a small camera at the end of a tube, to directly examine the colon (sigmoidoscopy or colonoscopy), can detect the earliest forms of colorectal cancer. Talk to your caregiver about this at age 86, when routine screening begins. Direct examination of the colon should be repeated every 5 to 10 years through age 33, unless early forms of pre-cancerous polyps or small growths are found.  Hepatitis C blood testing is recommended for all people born from [birth year] through 1966 and any individual with known risks for hepatitis C.  Practice safe sex. Use condoms and avoid high-risk sexual practices to reduce the spread of sexually transmitted infections (STIs). Sexually active women aged 56 and younger should be checked for Chlamydia, which is a common sexually transmitted infection. Older women with new or multiple partners should also be tested for Chlamydia. Testing for other STIs is recommended if you are sexually active and at increased risk.  Osteoporosis is a disease in which the bones lose minerals and strength with aging. This can result in serious bone fractures. The risk of osteoporosis can be identified using a bone density scan. Women ages 71 and over and women at risk for fractures or osteoporosis should discuss screening with their caregivers. Ask your caregiver whether you should be taking a calcium supplement or vitamin D to reduce the rate of osteoporosis.  Menopause can be associated with physical symptoms and risks. Hormone replacement therapy is available to decrease symptoms and risks. You should talk to your caregiver about whether hormone replacement therapy is right for you.  Use sunscreen. Apply sunscreen liberally and repeatedly throughout the day. You should seek shade when your shadow is shorter than you. Protect yourself by wearing long sleeves, pants, a wide-brimmed hat, and sunglasses year round, whenever you are outdoors.  Notify your caregiver of new moles or changes in moles,  especially if there is a change in shape or color. Also notify your caregiver if a mole is larger than the size of a pencil eraser.  Stay current with your immunizations. Document Released: 11/22/2010 Document Revised: 09/03/2012 Document Reviewed: 11/22/2010 [facility] Patient Information 2014 [place].

## 2013-11-25 ENCOUNTER — Inpatient Hospital Stay: Admission: RE | Admit: 2013-11-25 | Payer: 59 | Source: Ambulatory Visit

## 2013-11-25 ENCOUNTER — Ambulatory Visit
Admission: RE | Admit: 2013-11-25 | Discharge: 2013-11-25 | Disposition: A | Discharge status: Home / Self Care | Payer: 59 | Source: Ambulatory Visit | Attending: Internal Medicine | Admitting: Internal Medicine

## 2013-11-25 DIAGNOSIS — Z1239 Encounter for other screening for malignant neoplasm of breast: Secondary | ICD-10-CM

## 2013-12-06 ENCOUNTER — Encounter: Payer: Self-pay | Admitting: Internal Medicine

## 2013-12-19 ENCOUNTER — Telehealth: Payer: Self-pay | Admitting: Internal Medicine

## 2013-12-19 NOTE — Telephone Encounter (Signed)
Notified pt with results. Pt is wanting copy of mamma & labs mail to her address...Carrie Dalton/lmb

## 2013-12-19 NOTE — Telephone Encounter (Signed)
Patient is calling to request her mammogram results from 7.06.15. Please advise.

## 2014-03-07 ENCOUNTER — Other Ambulatory Visit: Payer: Self-pay | Admitting: Internal Medicine

## 2014-03-24 LAB — PSA

## 2014-03-24 LAB — HM PAP SMEAR

## 2014-04-18 ENCOUNTER — Other Ambulatory Visit: Payer: Self-pay | Admitting: Internal Medicine

## 2014-04-18 NOTE — Telephone Encounter (Signed)
Pt last had refill on triazolam 07/17/13 #30 x 2 refills Last OV 01/03/13 Please advise on refill Dr. Maple HudsonYoung thanks

## 2014-04-18 NOTE — Telephone Encounter (Signed)
Asked refill triazolam- [facility]

## 2014-04-18 NOTE — Telephone Encounter (Signed)
Ok to refill total 6 months 

## 2014-04-30 ENCOUNTER — Other Ambulatory Visit: Payer: Self-pay | Admitting: Internal Medicine

## 2014-05-01 NOTE — Telephone Encounter (Signed)
Called refill to pharmacy-gave verbal refill authorization

## 2014-05-01 NOTE — Telephone Encounter (Signed)
Please advise on refill. Thanks. 

## 2014-05-01 NOTE — Telephone Encounter (Signed)
Ok to refill as before 

## 2014-05-02 ENCOUNTER — Other Ambulatory Visit: Payer: Self-pay | Admitting: Pulmonary Disease

## 2014-05-02 ENCOUNTER — Other Ambulatory Visit (INDEPENDENT_AMBULATORY_CARE_PROVIDER_SITE_OTHER): Payer: 59

## 2014-05-02 ENCOUNTER — Ambulatory Visit (HOSPITAL_COMMUNITY)
Admission: RE | Admit: 2014-05-02 | Discharge: 2014-05-02 | Disposition: A | Discharge status: Home / Self Care | Payer: 59 | Source: Ambulatory Visit | Attending: Pulmonary Disease | Admitting: Pulmonary Disease

## 2014-05-02 DIAGNOSIS — R112 Nausea with vomiting, unspecified: Secondary | ICD-10-CM | POA: Diagnosis not present

## 2014-05-02 DIAGNOSIS — K824 Cholesterolosis of gallbladder: Secondary | ICD-10-CM | POA: Diagnosis not present

## 2014-05-02 DIAGNOSIS — R1011 Right upper quadrant pain: Secondary | ICD-10-CM

## 2014-05-02 DIAGNOSIS — K760 Fatty (change of) liver, not elsewhere classified: Secondary | ICD-10-CM | POA: Insufficient documentation

## 2014-05-02 LAB — CBC WITH DIFFERENTIAL/PLATELET
BASOS ABS: 0 10*3/uL (ref 0.0–0.1)
Basophils Relative: 0.5 % (ref 0.0–3.0)
Eosinophils Absolute: 0.9 10*3/uL — ABNORMAL HIGH (ref 0.0–0.7)
Eosinophils Relative: 9.7 % — ABNORMAL HIGH (ref 0.0–5.0)
HCT: 44.1 % (ref 36.0–46.0)
Hemoglobin: 14.6 g/dL (ref 12.0–15.0)
LYMPHS PCT: 23 % (ref 12.0–46.0)
Lymphs Abs: 2.2 10*3/uL (ref 0.7–4.0)
MCHC: 33 g/dL (ref 30.0–36.0)
MCV: 88.5 fl (ref 78.0–100.0)
MONO ABS: 0.7 10*3/uL (ref 0.1–1.0)
Monocytes Relative: 7.4 % (ref 3.0–12.0)
Neutro Abs: 5.7 10*3/uL (ref 1.4–7.7)
Neutrophils Relative %: 59.4 % (ref 43.0–77.0)
PLATELETS: 305 10*3/uL (ref 150.0–400.0)
RBC: 4.98 Mil/uL (ref 3.87–5.11)
RDW: 12.6 % (ref 11.5–15.5)
WBC: 9.6 10*3/uL (ref 4.0–10.5)

## 2014-05-02 LAB — BASIC METABOLIC PANEL
BUN: 11 mg/dL (ref 6–23)
CO2: 27 mEq/L (ref 19–32)
Calcium: 9.7 mg/dL (ref 8.4–10.5)
Chloride: 106 mEq/L (ref 96–112)
Creatinine, Ser: 0.8 mg/dL (ref 0.4–1.2)
GFR: 77.95 mL/min (ref >60.00)
Glucose, Bld: 96 mg/dL (ref 70–99)
Potassium: 4.8 mEq/L (ref 3.5–5.1)
SODIUM: 137 meq/L (ref 135–145)

## 2014-05-02 LAB — HEPATIC FUNCTION PANEL
ALT: 12 U/L (ref 0–35)
AST: 15 U/L (ref 0–37)
Albumin: 3.9 g/dL (ref 3.5–5.2)
Alkaline Phosphatase: 72 U/L (ref 39–117)
BILIRUBIN DIRECT: 0 mg/dL (ref 0.0–0.3)
Total Bilirubin: 0.6 mg/dL (ref 0.2–1.2)
Total Protein: 6.8 g/dL (ref 6.0–8.3)

## 2014-05-02 LAB — SEDIMENTATION RATE: SED RATE: 11 mm/h (ref 0–22)

## 2014-05-12 ENCOUNTER — Telehealth: Payer: Self-pay | Admitting: Internal Medicine

## 2014-05-12 MED ORDER — AZITHROMYCIN 250 MG PO TABS: ORAL_TABLET | ORAL | Status: DC

## 2014-05-12 NOTE — Telephone Encounter (Signed)
Acute bronchitis

## 2014-05-30 ENCOUNTER — Telehealth: Payer: Self-pay | Admitting: Internal Medicine

## 2014-05-30 MED ORDER — AMOXICILLIN-POT CLAVULANATE 500-125 MG PO TABS: 1.0000 | ORAL_TABLET | Freq: Three times a day (TID) | ORAL | Status: DC

## 2014-05-30 MED ORDER — PROMETHAZINE-CODEINE 6.25-10 MG/5ML PO SYRP: 5.0000 mL | ORAL_SOLUTION | Freq: Four times a day (QID) | ORAL | Status: DC | PRN

## 2014-05-30 NOTE — Telephone Encounter (Signed)
Persistent deep cough did not respond to Z pak Plan- augmentin and prometh codeine called [facility]

## 2014-08-04 ENCOUNTER — Other Ambulatory Visit: Payer: Self-pay | Admitting: *Deleted

## 2014-08-04 MED ORDER — ALPRAZOLAM 0.25 MG PO TABS: ORAL_TABLET | ORAL | Status: DC

## 2014-10-22 ENCOUNTER — Other Ambulatory Visit: Payer: Self-pay | Admitting: Internal Medicine

## 2014-10-22 NOTE — Telephone Encounter (Signed)
CY please advise on refill; pt will take her last tablet tonight. Thanks.

## 2014-10-23 NOTE — Telephone Encounter (Signed)
Ok to refill x 6 months 

## 2014-10-28 ENCOUNTER — Other Ambulatory Visit: Payer: Self-pay | Admitting: Internal Medicine

## 2014-10-28 NOTE — Telephone Encounter (Signed)
This was called to pharmacy; pt is aware as well.

## 2014-11-11 ENCOUNTER — Encounter: Payer: 59 | Admitting: Internal Medicine

## 2014-11-26 ENCOUNTER — Other Ambulatory Visit: Payer: Self-pay | Admitting: Internal Medicine

## 2014-12-03 ENCOUNTER — Other Ambulatory Visit: Payer: Self-pay | Admitting: Internal Medicine

## 2015-01-05 ENCOUNTER — Other Ambulatory Visit: Payer: Self-pay | Admitting: Internal Medicine

## 2015-01-05 NOTE — Telephone Encounter (Signed)
CY Please advise on refill. Thanks.  

## 2015-01-05 NOTE — Telephone Encounter (Signed)
Ok to refill 

## 2015-01-05 NOTE — Telephone Encounter (Signed)
Left Refill on voicemail at pharmacy.

## 2015-02-17 ENCOUNTER — Other Ambulatory Visit (INDEPENDENT_AMBULATORY_CARE_PROVIDER_SITE_OTHER): Payer: 59

## 2015-02-17 ENCOUNTER — Ambulatory Visit (INDEPENDENT_AMBULATORY_CARE_PROVIDER_SITE_OTHER): Payer: 59 | Admitting: Internal Medicine

## 2015-02-17 ENCOUNTER — Encounter: Payer: Self-pay | Admitting: Internal Medicine

## 2015-02-17 VITALS — BP 130/80 | HR 77 | Temp 98.4°F | Ht 62.0 in | Wt 196.0 lb

## 2015-02-17 DIAGNOSIS — Z23 Encounter for immunization: Secondary | ICD-10-CM | POA: Diagnosis not present

## 2015-02-17 DIAGNOSIS — E669 Obesity, unspecified: Secondary | ICD-10-CM

## 2015-02-17 DIAGNOSIS — F411 Generalized anxiety disorder: Secondary | ICD-10-CM | POA: Diagnosis not present

## 2015-02-17 DIAGNOSIS — Z0000 Encounter for general adult medical examination without abnormal findings: Secondary | ICD-10-CM | POA: Diagnosis not present

## 2015-02-17 DIAGNOSIS — M8580 Other specified disorders of bone density and structure, unspecified site: Secondary | ICD-10-CM | POA: Diagnosis not present

## 2015-02-17 LAB — TSH: TSH: 0.87 u[IU]/mL (ref 0.35–4.50)

## 2015-02-17 LAB — CBC WITH DIFFERENTIAL/PLATELET
BASOS ABS: 0 10*3/uL (ref 0.0–0.1)
BASOS PCT: 0.3 % (ref 0.0–3.0)
EOS ABS: 0.3 10*3/uL (ref 0.0–0.7)
Eosinophils Relative: 4.3 % (ref 0.0–5.0)
HCT: 42.5 % (ref 36.0–46.0)
Hemoglobin: 14.2 g/dL (ref 12.0–15.0)
Lymphocytes Relative: 23.7 % (ref 12.0–46.0)
Lymphs Abs: 1.7 10*3/uL (ref 0.7–4.0)
MCHC: 33.5 g/dL (ref 30.0–36.0)
MCV: 87.8 fl (ref 78.0–100.0)
MONO ABS: 0.5 10*3/uL (ref 0.1–1.0)
Monocytes Relative: 7.5 % (ref 3.0–12.0)
NEUTROS ABS: 4.6 10*3/uL (ref 1.4–7.7)
Neutrophils Relative %: 64.2 % (ref 43.0–77.0)
PLATELETS: 291 10*3/uL (ref 150.0–400.0)
RBC: 4.84 Mil/uL (ref 3.87–5.11)
RDW: 12.7 % (ref 11.5–15.5)
WBC: 7.2 10*3/uL (ref 4.0–10.5)

## 2015-02-17 LAB — HEPATIC FUNCTION PANEL
ALT: 12 U/L (ref 0–35)
AST: 12 U/L (ref 0–37)
Albumin: 4.2 g/dL (ref 3.5–5.2)
Alkaline Phosphatase: 72 U/L (ref 39–117)
BILIRUBIN DIRECT: 0.1 mg/dL (ref 0.0–0.3)
Total Bilirubin: 0.4 mg/dL (ref 0.2–1.2)
Total Protein: 6.8 g/dL (ref 6.0–8.3)

## 2015-02-17 LAB — BASIC METABOLIC PANEL
BUN: 8 mg/dL (ref 6–23)
CALCIUM: 9.8 mg/dL (ref 8.4–10.5)
CHLORIDE: 107 meq/L (ref 96–112)
CO2: 27 meq/L (ref 19–32)
CREATININE: 0.77 mg/dL (ref 0.40–1.20)
GFR: 81.24 mL/min (ref >60.00)
Glucose, Bld: 113 mg/dL — ABNORMAL HIGH (ref 70–99)
Potassium: 4.7 mEq/L (ref 3.5–5.1)
Sodium: 142 mEq/L (ref 135–145)

## 2015-02-17 LAB — URINALYSIS, ROUTINE W REFLEX MICROSCOPIC
Bilirubin Urine: NEGATIVE
HGB URINE DIPSTICK: NEGATIVE
Ketones, ur: NEGATIVE
LEUKOCYTES UA: NEGATIVE
NITRITE: NEGATIVE
RBC / HPF: NONE SEEN (ref 0–?)
Specific Gravity, Urine: <=1.005 — AB (ref 1.000–1.030)
TOTAL PROTEIN, URINE-UPE24: NEGATIVE
Urine Glucose: NEGATIVE
Urobilinogen, UA: 0.2 (ref 0.0–1.0)
WBC, UA: NONE SEEN (ref 0–?)
pH: 6 (ref 5.0–8.0)

## 2015-02-17 LAB — LIPID PANEL
CHOL/HDL RATIO: 5
Cholesterol: 196 mg/dL (ref 0–200)
HDL: 39.8 mg/dL (ref >39.00)
LDL Cholesterol: 132 mg/dL — ABNORMAL HIGH (ref 0–99)
NonHDL: 156.1
TRIGLYCERIDES: 120 mg/dL (ref 0.0–149.0)
VLDL: 24 mg/dL (ref 0.0–40.0)

## 2015-02-17 MED ORDER — IBANDRONATE SODIUM 150 MG PO TABS: 150.0000 mg | ORAL_TABLET | ORAL | Status: DC

## 2015-02-17 NOTE — Progress Notes (Signed)
Pre visit review using our clinic review tool, if applicable. No additional management support is needed unless otherwise documented below in the visit note. 

## 2015-02-17 NOTE — Progress Notes (Signed)
Subjective:    Patient ID: Carrie Dalton, female    DOB: 06-26-1955, 59 y.o.   MRN: [id number]  HPI  patient is here today for annual physical. Patient feels well and has no complaints today. Also reviewed chronic medical conditions, interval events and current concerns  Past Medical History  Diagnosis Date  . UNSPECIFIED PAROXYSMAL TACHYCARDIA   . NECK PAIN 11/05/2009  . OSTEOPENIA   . POSITIVE PPD   . SLEEP DISORDER, HX OF   . DEGENERATIVE JOINT DISEASE   . ASTHMA, UNSPECIFIED, UNSPECIFIED STATUS   . HYPERCHOLESTEROLEMIA, BORDERLINE    Family History  Problem Relation Age of Onset  . Diabetes Father   . Hypertension Father   . Stroke Mother   . Atrial fibrillation Mother   . Prostate cancer Maternal Grandfather   . Atrial fibrillation Maternal Grandmother   . Heart failure Maternal Grandmother     CHF  . Hypertension Maternal Grandmother   . Heart disease Daughter     heart transplant  . Pancreatitis Daughter    Social History  Substance Use Topics  . Smoking status: Former Smoker    Quit date: 05/23/2000  . Smokeless tobacco: Never Used  . Alcohol Use: No    Review of Systems  Constitutional: Positive for diaphoresis (sweats at work, weekly event all year). Negative for fever, fatigue and unexpected weight change.  Respiratory: Negative for cough, shortness of breath and wheezing.   Cardiovascular: Negative for chest pain, palpitations and leg swelling.  Gastrointestinal: Positive for abdominal pain (RUQ/epigastric sx, intermittent for years, unchanged - ?aggrevated by salad) and diarrhea (every other day liquid BM for years). Negative for nausea, vomiting, blood in stool and abdominal distention.  Musculoskeletal: Positive for arthralgias.  Neurological: Negative for dizziness, weakness, light-headedness and headaches.  Psychiatric/Behavioral: Negative for dysphoric mood. The patient is not nervous/anxious.   All other systems reviewed and are negative.     Objective:    Physical Exam  Constitutional: She is oriented to person, place, and time. She appears well-developed and well-nourished. No distress.  obese  HENT:  Head: Normocephalic and atraumatic.  Right Ear: External ear normal.  Left Ear: External ear normal.  Nose: Nose normal.  Mouth/Throat: Oropharynx is clear and moist. No oropharyngeal exudate.  Eyes: EOM are normal. Pupils are equal, round, and reactive to light. Right eye exhibits no discharge. Left eye exhibits no discharge. No scleral icterus.  Neck: Normal range of motion. Neck supple. No JVD present. No tracheal deviation present. No thyromegaly present.  Cardiovascular: Normal rate, regular rhythm, normal heart sounds and intact distal pulses.  Exam reveals no friction rub.   No murmur heard. Pulmonary/Chest: Effort normal and breath sounds normal. No respiratory distress. She has no wheezes. She has no rales. She exhibits no tenderness.  Abdominal: Soft. Bowel sounds are normal. She exhibits no distension and no mass. There is no tenderness. There is no rebound and no guarding.  Musculoskeletal: Normal range of motion.  Lymphadenopathy:    She has no cervical adenopathy.  Neurological: She is alert and oriented to person, place, and time. She has normal reflexes. No cranial nerve deficit.  Skin: Skin is warm and dry. No rash noted. She is not diaphoretic. No erythema.  Psychiatric: She has a normal mood and affect. Her behavior is normal. Judgment and thought content normal.  Nursing note and vitals reviewed.   BP 130/80 mmHg  Pulse 77  Temp(Src) 98.4 F (36.9 C) (Oral)  Ht  (1.575  m)  Wt 196 lb (88.905 kg)  BMI 35.84 kg/m2  SpO2 95% Wt Readings from Last 3 Encounters:  02/17/15 196 lb (88.905 kg)  11/05/13 197 lb 6.4 oz (89.54 kg)  03/13/12 204 lb (92.534 kg)    Lab Results  Component Value Date   WBC 9.6 05/02/2014   HGB 14.6 05/02/2014   HCT 44.1 05/02/2014   PLT 305.0 05/02/2014   GLUCOSE 96  05/02/2014   CHOL 191 11/05/2013   TRIG 99.0 11/05/2013   HDL 41.90 11/05/2013   LDLDIRECT 161.1 03/13/2012   LDLCALC 129* 11/05/2013   ALT 12 05/02/2014   AST 15 05/02/2014   NA 137 05/02/2014   K 4.8 05/02/2014   CL 106 05/02/2014   CREATININE 0.8 05/02/2014   BUN 11 05/02/2014   CO2 27 05/02/2014   TSH 0.68 11/05/2013   PSA WNL 03/24/2014    US Abdomen Complete  05/02/2014   CLINICAL DATA:  59 year old female with right upper quadrant pain, nausea vomiting. Initial encounter.  EXAM: ULTRASOUND ABDOMEN COMPLETE  COMPARISON:  Abdomen ultrasound 09/17/2010.  Lumbar MRI 02/11/2005.  FINDINGS: Gallbladder: Unchanged 5-6 mm gallbladder polyp located along the nondependent wall (image 55). No cholelithiasis. No gallbladder wall thickening or sonographic Murphy's sign.  Common bile duct: Diameter: 3 mm, normal  Liver: Chronic hepatic steatosis (image 40). No discrete liver lesion. No intrahepatic biliary ductal dilatation.  IVC: No abnormality visualized.  Pancreas: Visualized portion unremarkable.  Spleen: Size and appearance within normal limits.  Right Kidney: Length: 11.3 cm. Possible area of cortical scarring at the upper pole (images 46 and 47) however, I favor this is a pseudo lesion. Elsewhere echogenicity within normal limits. No mass or hydronephrosis visualized.  Left Kidney: Length: 11.3 cm. Echogenicity within normal limits. No mass or hydronephrosis visualized.  Abdominal aorta: No aneurysm visualized.  Other findings: None.  IMPRESSION: 1. No acute findings. 2. Chronic hepatic steatosis. 3. Stable small gallbladder polyp, inconsequential.   Electronically Signed   By: Augusto Gamble M.D.   On: 05/02/2014 13:44       Assessment & Plan:   Z00.00/CPX - Patient has been counseled on age-appropriate routine health concerns for screening and prevention. These are reviewed and up-to-date. Immunizations are up-to-date or declined. Labs ordered and reviewed.  Problem List Items Addressed This  Visit    Generalized anxiety disorder    Patient takes benzodiazepine's as needed for control of same. Manifest with various somatic concerns over time and patient again reassured to notify us for any change in symptoms for further imaging and evaluation as needed (such as intermittent right upper quadrant pain, diaphoresis, breathing changes, bowel changes or other symptoms)      Obesity    Wt Readings from Last 3 Encounters:  02/17/15 196 lb (88.905 kg)  11/05/13 197 lb 6.4 oz (89.54 kg)  03/13/12 204 lb (92.534 kg)   The patient is asked to make an attempt to improve diet and exercise patterns to aid in medical management of this problem.       Osteopenia    Unable to tolerate weekly Fosamax because of nausea  Changed to monthly [facility] November 2013 Recheck DEXA as planned with gynecology this fall, reconsider need for alternate treatment depending on results Also encouraged to continue calcium with vitamin D and weightbearing activities       Other Visit Diagnoses    Routine general medical examination at a health care facility    -  Primary    Relevant Orders  Basic metabolic panel    CBC with Differential/Platelet    Hepatic function panel    Lipid panel    TSH    Urinalysis, Routine w reflex microscopic (not at [facility])        Gwendolyn Grant, MD

## 2015-02-17 NOTE — Patient Instructions (Addendum)
It was good to see you today.  We have reviewed your prior records including labs and tests today  Shingles vaccine administered today. All other Health Maintenance issues reviewed - be sure to have bone density done this fall with your gynecologist and let us know results. All other recommended immunizations and age-appropriate screenings are up-to-date.  Test(s) ordered today. Your results will be released to [place] (or called to you) after review, usually within 72hours after test completion. If any changes need to be made, you will be notified at that same time.  Medications reviewed and updated, no changes recommended at this time. Refill on medication(s) as discussed today.  Please schedule followup in 12 months for annual exam and labs, call sooner if problems.  Health Maintenance Adopting a healthy lifestyle and getting preventive care can go a long way to promote health and wellness. Talk with your health care provider about what schedule of regular examinations is right for you. This is a good chance for you to check in with your provider about disease prevention and staying healthy. In between checkups, there are plenty of things you can do on your own. Experts have done a lot of research about which lifestyle changes and preventive measures are most likely to keep you healthy. Ask your health care provider for more information. WEIGHT AND DIET  Eat a healthy diet  Be sure to include plenty of vegetables, fruits, low-fat dairy products, and lean protein.  Do not eat a lot of foods high in solid fats, added sugars, or salt.  Get regular exercise. This is one of the most important things you can do for your health.  Most adults should exercise for at least 150 minutes each week. The exercise should increase your heart rate and make you sweat (moderate-intensity exercise).  Most adults should also do strengthening exercises at least twice a week. This is in addition to the  moderate-intensity exercise.  Maintain a healthy weight  Body mass index (BMI) is a measurement that can be used to identify possible weight problems. It estimates body fat based on height and weight. Your health care provider can help determine your BMI and help you achieve or maintain a healthy weight.  For females 60 years of age and older:   A BMI below 18.5 is considered underweight.  A BMI of 18.5 to 24.9 is normal.  A BMI of 25 to 29.9 is considered overweight.  A BMI of 30 and above is considered obese.  Watch levels of cholesterol and blood lipids  You should start having your blood tested for lipids and cholesterol at 60 years of age, then have this test every 5 years.  You may need to have your cholesterol levels checked more often if:  Your lipid or cholesterol levels are high.  You are older than 60 years of age.  You are at high risk for heart disease.  CANCER SCREENING   Lung Cancer  Lung cancer screening is recommended for adults 60-32 years old who are at high risk for lung cancer because of a history of smoking.  A yearly low-dose CT scan of the lungs is recommended for people who:  Currently smoke.  Have quit within the past 15 years.  Have at least a 30-pack-year history of smoking. A pack year is smoking an average of one pack of cigarettes a day for 1 year.  Yearly screening should continue until it has been 15 years since you quit.  Yearly screening should stop  if you develop a health problem that would prevent you from having lung cancer treatment.  Breast Cancer  Practice breast self-awareness. This means understanding how your breasts normally appear and feel.  It also means doing regular breast self-exams. Let your health care provider know about any changes, no matter how small.  If you are in your 60s or 30s you should have a clinical breast exam (CBE) by a health care provider every 1-3 years as part of a regular health exam.  If  you are 60 or older, have a CBE every year. Also consider having a breast X-ray (mammogram) every year.  If you have a family history of breast cancer, talk to your health care provider about genetic screening.  If you are at high risk for breast cancer, talk to your health care provider about having an MRI and a mammogram every year.  Breast cancer gene (BRCA) assessment is recommended for women who have family members with BRCA-related cancers. BRCA-related cancers include:  Breast.  Ovarian.  Tubal.  Peritoneal cancers.  Results of the assessment will determine the need for genetic counseling and BRCA1 and BRCA2 testing. Cervical Cancer Routine pelvic examinations to screen for cervical cancer are no longer recommended for nonpregnant women who are considered low risk for cancer of the pelvic organs (ovaries, uterus, and vagina) and who do not have symptoms. A pelvic examination may be necessary if you have symptoms including those associated with pelvic infections. Ask your health care provider if a screening pelvic exam is right for you.   The Pap test is the screening test for cervical cancer for women who are considered at risk.  If you had a hysterectomy for a problem that was not cancer or a condition that could lead to cancer, then you no longer need Pap tests.  If you are older than 65 years, and you have had normal Pap tests for the past 10 years, you no longer need to have Pap tests.  If you have had past treatment for cervical cancer or a condition that could lead to cancer, you need Pap tests and screening for cancer for at least 20 years after your treatment.  If you no longer get a Pap test, assess your risk factors if they change (such as having a new sexual partner). This can affect whether you should start being screened again.  Some women have medical problems that increase their chance of getting cervical cancer. If this is the case for you, your health care  provider may recommend more frequent screening and Pap tests.  The human papillomavirus (HPV) test is another test that may be used for cervical cancer screening. The HPV test looks for the virus that can cause cell changes in the cervix. The cells collected during the Pap test can be tested for HPV.  The HPV test can be used to screen women 30 years of age and older. Getting tested for HPV can extend the interval between normal Pap tests from three to five years.  An HPV test also should be used to screen women of any age who have unclear Pap test results.  After 60 years of age, women should have HPV testing as often as Pap tests.  Colorectal Cancer  This type of cancer can be detected and often prevented.  Routine colorectal cancer screening usually begins at 60 years of age and continues through 60 years of age.  Your health care provider may recommend screening at an earlier age if  you have risk factors for colon cancer.  Your health care provider may also recommend using home test kits to check for hidden blood in the stool.  A small camera at the end of a tube can be used to examine your colon directly (sigmoidoscopy or colonoscopy). This is done to check for the earliest forms of colorectal cancer.  Routine screening usually begins at age 22.  Direct examination of the colon should be repeated every 5-10 years through 60 years of age. However, you may need to be screened more often if early forms of precancerous polyps or small growths are found. Skin Cancer  Check your skin from head to toe regularly.  Tell your health care provider about any new moles or changes in moles, especially if there is a change in a mole's shape or color.  Also tell your health care provider if you have a mole that is larger than the size of a pencil eraser.  Always use sunscreen. Apply sunscreen liberally and repeatedly throughout the day.  Protect yourself by wearing long sleeves, pants, a  wide-brimmed hat, and sunglasses whenever you are outside. HEART DISEASE, DIABETES, AND HIGH BLOOD PRESSURE   Have your blood pressure checked at least every 1-2 years. High blood pressure causes heart disease and increases the risk of stroke.  If you are between 17 years and 75 years old, ask your health care provider if you should take aspirin to prevent strokes.  Have regular diabetes screenings. This involves taking a blood sample to check your fasting blood sugar level.  If you are at a normal weight and have a low risk for diabetes, have this test once every three years after 61 years of age.  If you are overweight and have a high risk for diabetes, consider being tested at a younger age or more often. PREVENTING INFECTION  Hepatitis B  If you have a higher risk for hepatitis B, you should be screened for this virus. You are considered at high risk for hepatitis B if:  You were born in a country where hepatitis B is common. Ask your health care provider which countries are considered high risk.  Your parents were born in a high-risk country, and you have not been immunized against hepatitis B (hepatitis B vaccine).  You have HIV or AIDS.  You use needles to inject street drugs.  You live with someone who has hepatitis B.  You have had sex with someone who has hepatitis B.  You get hemodialysis treatment.  You take certain medicines for conditions, including cancer, organ transplantation, and autoimmune conditions. Hepatitis C  Blood testing is recommended for:  Everyone born from [birth year] through 1966.  Anyone with known risk factors for hepatitis C. Sexually transmitted infections (STIs)  You should be screened for sexually transmitted infections (STIs) including gonorrhea and chlamydia if:  You are sexually active and are younger than 60 years of age.  You are older than 60 years of age and your health care provider tells you that you are at risk for this type of  infection.  Your sexual activity has changed since you were last screened and you are at an increased risk for chlamydia or gonorrhea. Ask your health care provider if you are at risk.  If you do not have HIV, but are at risk, it may be recommended that you take a prescription medicine daily to prevent HIV infection. This is called pre-exposure prophylaxis (PrEP). You are considered at risk if:  You  are sexually active and do not regularly use condoms or know the HIV status of your partner(s).  You take drugs by injection.  You are sexually active with a partner who has HIV. Talk with your health care provider about whether you are at high risk of being infected with HIV. If you choose to begin PrEP, you should first be tested for HIV. You should then be tested every 3 months for as long as you are taking PrEP.  PREGNANCY   If you are premenopausal and you may become pregnant, ask your health care provider about preconception counseling.  If you may become pregnant, take 400 to 800 micrograms (mcg) of folic acid every day.  If you want to prevent pregnancy, talk to your health care provider about birth control (contraception). OSTEOPOROSIS AND MENOPAUSE   Osteoporosis is a disease in which the bones lose minerals and strength with aging. This can result in serious bone fractures. Your risk for osteoporosis can be identified using a bone density scan.  If you are 45 years of age or older, or if you are at risk for osteoporosis and fractures, ask your health care provider if you should be screened.  Ask your health care provider whether you should take a calcium or vitamin D supplement to lower your risk for osteoporosis.  Menopause may have certain physical symptoms and risks.  Hormone replacement therapy may reduce some of these symptoms and risks. Talk to your health care provider about whether hormone replacement therapy is right for you.  HOME CARE INSTRUCTIONS   Schedule regular  health, dental, and eye exams.  Stay current with your immunizations.   Do not use any tobacco products including cigarettes, chewing tobacco, or electronic cigarettes.  If you are pregnant, do not drink alcohol.  If you are breastfeeding, limit how much and how often you drink alcohol.  Limit alcohol intake to no more than 1 drink per day for nonpregnant women. One drink equals 12 ounces of beer, 5 ounces of wine, or 1 ounces of hard liquor.  Do not use street drugs.  Do not share needles.  Ask your health care provider for help if you need support or information about quitting drugs.  Tell your health care provider if you often feel depressed.  Tell your health care provider if you have ever been abused or do not feel safe at home. Document Released: 11/22/2010 Document Revised: 09/23/2013 Document Reviewed: 04/10/2013 [facility] Patient Information 2015 [place], [place]. This information is not intended to replace advice given to you by your health care provider. Make sure you discuss any questions you have with your health care provider.

## 2015-02-17 NOTE — Assessment & Plan Note (Signed)
Patient takes benzodiazepine's as needed for control of same. Manifest with various somatic concerns over time and patient again reassured to notify us for any change in symptoms for further imaging and evaluation as needed (such as intermittent right upper quadrant pain, diaphoresis, breathing changes, bowel changes or other symptoms)

## 2015-02-17 NOTE — Assessment & Plan Note (Signed)
Unable to tolerate weekly Fosamax because of nausea  Changed to monthly [facility] November 2013 Recheck DEXA as planned with gynecology this fall, reconsider need for alternate treatment depending on results Also encouraged to continue calcium with vitamin D and weightbearing activities

## 2015-02-17 NOTE — Assessment & Plan Note (Signed)
Wt Readings from Last 3 Encounters:  02/17/15 196 lb (88.905 kg)  11/05/13 197 lb 6.4 oz (89.54 kg)  03/13/12 204 lb (92.534 kg)   The patient is asked to make an attempt to improve diet and exercise patterns to aid in medical management of this problem.

## 2015-04-27 ENCOUNTER — Other Ambulatory Visit: Payer: Self-pay | Admitting: Internal Medicine

## 2015-04-27 NOTE — Telephone Encounter (Signed)
Ok to refill 

## 2015-04-27 NOTE — Telephone Encounter (Signed)
Cy Please advise on refill. Thanks.

## 2015-07-27 ENCOUNTER — Other Ambulatory Visit: Payer: Self-pay | Admitting: Internal Medicine

## 2015-07-27 NOTE — Telephone Encounter (Signed)
Per CY-okay to refill each of the 3 rx's this time plus 4 additional refills. Called to pharmacy.

## 2015-08-13 ENCOUNTER — Other Ambulatory Visit: Payer: Self-pay | Admitting: Internal Medicine

## 2015-08-13 NOTE — Telephone Encounter (Signed)
CY please advise on refill. Thanks. 

## 2015-08-13 NOTE — Telephone Encounter (Signed)
Rx has been called in per CY. Nothing further was needed. 

## 2015-08-13 NOTE — Telephone Encounter (Signed)
Ok to refill x 6 months 

## 2015-09-11 ENCOUNTER — Telehealth: Payer: Self-pay | Admitting: Internal Medicine

## 2015-09-11 MED ORDER — DOXYCYCLINE HYCLATE 100 MG PO TABS
ORAL_TABLET | ORAL | Status: DC
Start: 2015-09-11 — End: 2016-01-07

## 2015-09-11 NOTE — Telephone Encounter (Signed)
Per CY-lets give patient Doxycycline 100 mg #8 take 2 today then 1 daily until gone with 1 additional refill. [facility]. Pt is aware that I have sent medication to pharmacy and nothing more needed at this time.

## 2015-10-23 ENCOUNTER — Telehealth: Payer: Self-pay | Admitting: Internal Medicine

## 2015-10-23 MED ORDER — FLUTICASONE FUROATE-VILANTEROL 100-25 MCG/INH IN AEPB: 1.0000 | INHALATION_SPRAY | Freq: Every day | RESPIRATORY_TRACT | Status: DC

## 2015-10-23 MED ORDER — ALBUTEROL SULFATE HFA 108 (90 BASE) MCG/ACT IN AERS: 2.0000 | INHALATION_SPRAY | Freq: Four times a day (QID) | RESPIRATORY_TRACT | Status: DC | PRN

## 2015-10-23 NOTE — Telephone Encounter (Signed)
Ok refill Breo 100    # 60, refill x 12     Inhale 1 puff then rinse mouth, once daily  Ok refill albuterol rescue inhaler   # 1,  Refill x 12     Inhale 2 puffs every 6 hours if needed

## 2015-10-23 NOTE — Telephone Encounter (Signed)
Spoke with pt. She is aware that we will send in prescriptions. Rxs have been sent in. Nothing further was needed.

## 2015-10-23 NOTE — Telephone Encounter (Signed)
CY please advise on refill. These medications are not on pt's medication list. Thanks.

## 2015-10-23 NOTE — Telephone Encounter (Signed)
°*  STAT* If patient is at the pharmacy, call can be transferred to refill team.   1. Which medications need to be refilled? (please list name of each medication and dose if known) Breo   & Rescue inhaler   2. Which pharmacy/location (including street and city if local pharmacy) is medication to be sent to?  [facility] [place]   3. Do they need a 30 day or 90 day supply?   30

## 2015-12-25 ENCOUNTER — Other Ambulatory Visit: Payer: Self-pay | Admitting: Internal Medicine

## 2015-12-25 NOTE — Telephone Encounter (Signed)
Ok to refill x 6 months 

## 2015-12-25 NOTE — Telephone Encounter (Signed)
CY Please advise if okay to refill. Thanks.  

## 2016-01-04 ENCOUNTER — Other Ambulatory Visit: Payer: Self-pay | Admitting: Internal Medicine

## 2016-01-04 NOTE — Telephone Encounter (Signed)
CY Please advise on refill. Thanks.  

## 2016-01-04 NOTE — Telephone Encounter (Signed)
Ok to refill 6 months 

## 2016-01-07 ENCOUNTER — Telehealth: Payer: Self-pay | Admitting: Internal Medicine

## 2016-01-07 MED ORDER — DOXYCYCLINE HYCLATE 100 MG PO TABS: 100.0000 mg | ORAL_TABLET | Freq: Two times a day (BID) | ORAL | 0 refills | Status: DC

## 2016-01-07 NOTE — Telephone Encounter (Signed)
Per CY-okay to give patient Doxycycline 100 mg #14 take 1 po BID x 7 days. Pt is aware that Rx has been sent and nothing more needed at this time.

## 2016-01-11 ENCOUNTER — Ambulatory Visit (INDEPENDENT_AMBULATORY_CARE_PROVIDER_SITE_OTHER): Payer: 59

## 2016-01-11 DIAGNOSIS — Z23 Encounter for immunization: Secondary | ICD-10-CM | POA: Diagnosis not present

## 2016-01-26 ENCOUNTER — Telehealth: Payer: Self-pay | Admitting: Internal Medicine

## 2016-01-28 NOTE — Telephone Encounter (Signed)
Ok to refill 

## 2016-01-28 NOTE — Telephone Encounter (Signed)
ATC pt on her cell phone to see where she would like Rx sent (unsure what time [facility]) - LMOM TCB x1 @ 5:05pm  Since we were unable to reach patient and the office is now closed, have telephoned [facility] and spoken with pharmacist Marisue IvanLiz.  They close at 5:30pm.  Refill authorization given verbally as last filled by CY.  Called spoke with patient now and informed her of the above.  Pt stated no need to call Rx in to a different pharmacy, she is aware she may pick [facility] tomorrow.  Pt voiced her understanding.  Nothing further needed; will sign off

## 2016-01-28 NOTE — Telephone Encounter (Signed)
Pt needs Klonopin refilled asap. Has been out of this x 6 days. Last filled 12/25/15 x 5 refills.  Pt states that she is going to all and see if there are any refills left on file of this and she will call back .  Will await call back.

## 2016-01-28 NOTE — Telephone Encounter (Signed)
Pt states that Rx has expired and she needs a new one sent in.  Klonopin 0.5mg  (take 1-3 tab po qhs)  Please advise Dr Maple HudsonYoung. Thanks.

## 2016-02-08 ENCOUNTER — Other Ambulatory Visit: Payer: Self-pay | Admitting: Internal Medicine

## 2016-02-08 NOTE — Telephone Encounter (Signed)
Ok to refill 

## 2016-02-08 NOTE — Telephone Encounter (Signed)
CY Please advise on refill. Thanks.  

## 2016-02-18 ENCOUNTER — Other Ambulatory Visit: Payer: Self-pay | Admitting: *Deleted

## 2016-02-18 DIAGNOSIS — R309 Painful micturition, unspecified: Secondary | ICD-10-CM

## 2016-02-19 ENCOUNTER — Other Ambulatory Visit
Admission: RE | Admit: 2016-02-19 | Discharge: 2016-02-19 | Disposition: A | Discharge status: Home / Self Care | Payer: 59 | Source: Ambulatory Visit | Attending: Internal Medicine | Admitting: Internal Medicine

## 2016-02-19 DIAGNOSIS — R309 Painful micturition, unspecified: Secondary | ICD-10-CM | POA: Diagnosis not present

## 2016-02-19 LAB — URINALYSIS COMPLETE WITH MICROSCOPIC (ARMC ONLY)
BACTERIA UA: NONE SEEN
Bilirubin Urine: NEGATIVE
Glucose, UA: NEGATIVE mg/dL
Ketones, ur: NEGATIVE mg/dL
Nitrite: NEGATIVE
PH: 6 (ref 5.0–8.0)
Protein, ur: 100 mg/dL — AB
Specific Gravity, Urine: 1.006 (ref 1.005–1.030)

## 2016-02-20 LAB — URINE CULTURE

## 2016-03-10 NOTE — Progress Notes (Signed)
Subjective:    Patient ID: Carrie Dalton, female    DOB: 1955-09-12, 60 y.o.   MRN: [id number]  HPI She is here to establish with a new pcp.    ? UTI:  She was treated for a UTI 9/29 With Bactrim but still has symtpoms.  She has dysuria, dribbling with her urination, weak flow, the urine is darker.  She denies increased urinary frequency or blood in urine .  She has some lower back pain, but has chronic lower back pain.  She has some suprapubic pain, but always has some-She thinks she is slightly more than her chronic discomfort.  She denies fever.  She denies nausea.    Asthma: She follows with Dr. Maple HudsonYoung. She does have occasional cough and wheeze. She has some shortness of breath, but feels this is more related to deconditioning and obesity. She take Breo daily and uses her albuterol inhaler as needed.  Insomnia: She takes clonazepam and Restoril at night. These medications are prescribed by Dr. Maple HudsonYoung.  Generalized anxiety: She takes Ativan or Xanax during the day-once during the day. She typically alternates which when she takes because if she takes the same 1 it stops working.  She feels this works well.   GERD;  she denies daily symptoms and only takes her medication as needed. She does not feel that she needs the medication on a regular basis and overall feels her heartburn is well controlled.  Hypertension: She is taking her medication daily. She is compliant with a low sodium diet.   She is not exercising regularly.    She follows with her gynecologist and dermatologist regularly.  Medications and allergies reviewed with patient and updated if appropriate.  Patient Active Problem List   Diagnosis Date Noted  . Generalized anxiety disorder 02/17/2015  . Obesity 02/17/2015  . Osteopenia 11/26/2009  . ASTHMA, UNSPECIFIED, UNSPECIFIED STATUS 11/05/2009  . NECK PAIN 11/05/2009  . HYPERCHOLESTEROLEMIA, BORDERLINE 08/28/2008  . UNSPECIFIED PAROXYSMAL TACHYCARDIA 08/28/2008  .  POSITIVE PPD 08/28/2008  . DEGENERATIVE JOINT DISEASE 09/06/2007  . Chronic insomnia 09/06/2007  . EDEMA 08/27/2007    Current Outpatient Prescriptions on File Prior to Visit  Medication Sig Dispense Refill  . albuterol (PROVENTIL HFA;VENTOLIN HFA) 108 (90 Base) MCG/ACT inhaler Inhale 2 puffs into the lungs every 6 (six) hours as needed for wheezing or shortness of breath. 1 Inhaler 12  . ALPRAZolam (XANAX) 0.25 MG tablet TAKE 1-2 TABLETS BY MOUTH 3 TIMES DAILY AS NEEDED 60 tablet 5  . aspirin 81 MG tablet Take 81 mg by mouth daily.      . cholecalciferol (VITAMIN D) 1000 UNITS tablet Take 1,000 Units by mouth daily.      . clonazePAM (KLONOPIN) 0.5 MG tablet TAKE 1 TO 3 TABLETS BY MOUTH NIGHTLY AT BEDTIME 90 tablet 5  . cyclobenzaprine (FLEXERIL) 10 MG tablet Take 1 tablet (10 mg total) by mouth 3 (three) times daily as needed. 90 tablet 0  . fluticasone furoate-vilanterol (BREO ELLIPTA) 100-25 MCG/INH AEPB Inhale 1 puff into the lungs daily. 60 each 12  . LORazepam (ATIVAN) 1 MG tablet TAKE 1 TABLET BY MOUTH EVERY 8 HOURS AS NEEDED FOR ANXIETY 90 tablet 5  . Nebivolol HCl (BYSTOLIC) 20 MG TABS Take 1 tablet (20 mg total) by mouth daily. 90 tablet 3  . pantoprazole (PROTONIX) 40 MG tablet Take 1 tablet (40 mg total) by mouth daily. 90 tablet 3  . triazolam (HALCION) 0.25 MG tablet TAKE 1 TABLET BY  MOUTH AT BEDTIME AS NEEDED FOR SLEEP 30 tablet 5   No current facility-administered medications on file prior to visit.     Past Medical History:  Diagnosis Date  . ASTHMA, UNSPECIFIED, UNSPECIFIED STATUS   . DEGENERATIVE JOINT DISEASE   . HYPERCHOLESTEROLEMIA, BORDERLINE   . NECK PAIN 11/05/2009  . OSTEOPENIA   . POSITIVE PPD   . SLEEP DISORDER, HX OF   . UNSPECIFIED PAROXYSMAL TACHYCARDIA     Past Surgical History:  Procedure Laterality Date  . CESAREAN SECTION  1984  . DILATION AND CURETTAGE OF UTERUS     x 3    Social History   Social History  . Marital status: Married     Spouse name: N/A  . Number of children: 3  . Years of education: N/A   Occupational History  . PATIENT CARE COORDIN [employer]   Social History Main Topics  . Smoking status: Former Smoker    Quit date: 05/23/2000  . Smokeless tobacco: Never Used  . Alcohol use No  . Drug use: No  . Sexual activity: Not Asked   Other Topics Concern  . None   Social History Narrative  . None    Family History  Problem Relation Age of Onset  . Diabetes Father   . Hypertension Father   . Stroke Mother   . Atrial fibrillation Mother   . Prostate cancer Maternal Grandfather   . Atrial fibrillation Maternal Grandmother   . Heart failure Maternal Grandmother     CHF  . Hypertension Maternal Grandmother   . Heart disease Daughter     heart transplant  . Pancreatitis Daughter     Review of Systems  Constitutional: Negative for chills and fever.  Eyes: Negative for visual disturbance.  Respiratory: Positive for cough (intermittnet), shortness of breath (obesity, deconditioing) and wheezing (intermittent).   Cardiovascular: Positive for leg swelling (mild). Negative for chest pain and palpitations.  Gastrointestinal: Positive for abdominal pain (suprapubic discomfort).  Genitourinary: Positive for dysuria. Negative for frequency and hematuria.  Musculoskeletal: Positive for back pain (chronic ).  Neurological: Negative for dizziness, light-headedness and headaches.       Objective:   Vitals:   03/11/16 1528  BP: 130/76  Pulse: 91  Resp: 18  Temp: 98.9 F (37.2 C)   Filed Weights   03/11/16 1528  Weight: 195 lb (88.5 kg)   Body mass index is 35.67 kg/m.   Physical Exam Constitutional: Appears well-developed and well-nourished. No distress.  HENT:  Head: Normocephalic and atraumatic.  Neck: Neck supple. No tracheal deviation present. No thyromegaly present.  No cervical lymphadenopathy Cardiovascular: Normal rate, regular rhythm and normal heart sounds.   No murmur  heard. No carotid bruit .  Trace bilateral lower extremity edema Pulmonary/Chest: Effort normal and breath sounds normal. No respiratory distress. No has no wheezes. No rales.  Abdomen: Mild suprapubic discomfort with palpation-no rebound or guarding, no other tenderness throughout abdomen, soft, obese  Skin: Skin is warm and dry. Not diaphoretic.  Psychiatric: Normal mood and affect. Behavior is normal.         Assessment & Plan:   See Problem List for Assessment and Plan of chronic medical problems.  Advised her to follow up in approximately 6 months for physical exam so we can do blood work and discuss referral for colonoscopy. She deferred referral for colonoscopy at this time.

## 2016-03-11 ENCOUNTER — Other Ambulatory Visit (INDEPENDENT_AMBULATORY_CARE_PROVIDER_SITE_OTHER): Payer: 59

## 2016-03-11 ENCOUNTER — Ambulatory Visit (INDEPENDENT_AMBULATORY_CARE_PROVIDER_SITE_OTHER): Payer: 59 | Admitting: Internal Medicine

## 2016-03-11 ENCOUNTER — Encounter: Payer: Self-pay | Admitting: Internal Medicine

## 2016-03-11 VITALS — BP 130/76 | HR 91 | Temp 98.9°F | Resp 18 | Ht 62.0 in | Wt 195.0 lb

## 2016-03-11 DIAGNOSIS — F5104 Psychophysiologic insomnia: Secondary | ICD-10-CM

## 2016-03-11 DIAGNOSIS — F411 Generalized anxiety disorder: Secondary | ICD-10-CM

## 2016-03-11 DIAGNOSIS — K219 Gastro-esophageal reflux disease without esophagitis: Secondary | ICD-10-CM | POA: Diagnosis not present

## 2016-03-11 DIAGNOSIS — R300 Dysuria: Secondary | ICD-10-CM

## 2016-03-11 DIAGNOSIS — J4530 Mild persistent asthma, uncomplicated: Secondary | ICD-10-CM

## 2016-03-11 DIAGNOSIS — I10 Essential (primary) hypertension: Secondary | ICD-10-CM | POA: Insufficient documentation

## 2016-03-11 LAB — POCT URINALYSIS DIPSTICK
Bilirubin, UA: NEGATIVE
Blood, UA: NEGATIVE
GLUCOSE UA: NEGATIVE
Ketones, UA: NEGATIVE
LEUKOCYTES UA: NEGATIVE
NITRITE UA: NEGATIVE
Protein, UA: NEGATIVE
SPEC GRAV UA: 1.025
UROBILINOGEN UA: 0.2
pH, UA: 6

## 2016-03-11 LAB — URINALYSIS
BILIRUBIN URINE: NEGATIVE
HGB URINE DIPSTICK: NEGATIVE
Ketones, ur: NEGATIVE
LEUKOCYTES UA: NEGATIVE
NITRITE: NEGATIVE
Specific Gravity, Urine: 1.01 (ref 1.000–1.030)
Total Protein, Urine: NEGATIVE
UROBILINOGEN UA: 0.2 (ref 0.0–1.0)
Urine Glucose: NEGATIVE
pH: 6 (ref 5.0–8.0)

## 2016-03-11 MED ORDER — PANTOPRAZOLE SODIUM 40 MG PO TBEC: 40.0000 mg | DELAYED_RELEASE_TABLET | Freq: Every day | ORAL | 3 refills | Status: DC

## 2016-03-11 MED ORDER — NEBIVOLOL HCL 20 MG PO TABS: 0.5000 | ORAL_TABLET | Freq: Every day | ORAL | 3 refills | Status: DC

## 2016-03-11 MED ORDER — CYCLOBENZAPRINE HCL 10 MG PO TABS: 10.0000 mg | ORAL_TABLET | Freq: Three times a day (TID) | ORAL | 0 refills | Status: DC | PRN

## 2016-03-11 NOTE — Patient Instructions (Addendum)
  Test(s) ordered today. Your results will be released to MyChart (or called to you) after review, usually within 72hours after test completion. If any changes need to be made, you will be notified at that same time.   Medications reviewed and updated.  No changes in medication  A referral for urology was ordered.  Please followup in 6 months for a physical so we can do blood work

## 2016-03-11 NOTE — Assessment & Plan Note (Signed)
Chronic insomnia Taking Halcion and clonazepam nightly Medications prescribed by Dr. Maple HudsonYoung

## 2016-03-11 NOTE — Assessment & Plan Note (Signed)
Treated for a recent UTI with Bactrim Symptoms improved, but still has some symptoms Urine dip here today negative Will send for urinalysis and culture We will refer to urology given her dysuria and difficulty with urination Will treat if culture comes back positive

## 2016-03-11 NOTE — Progress Notes (Signed)
Pre visit review using our clinic review tool, if applicable. No additional management support is needed unless otherwise documented below in the visit note. 

## 2016-03-11 NOTE — Assessment & Plan Note (Signed)
Taking Ativan or Xanax once daily-she alternates between the 2 because if she takes one consistently stops working Medications prescribed by Dr. Maple HudsonYoung

## 2016-03-11 NOTE — Assessment & Plan Note (Signed)
BP well controlled Current regimen effective and well tolerated Continue current medications at current doses She would like to hold off on blood work from now-we will do at a later date

## 2016-03-11 NOTE — Assessment & Plan Note (Signed)
Intermittent GERD Uses pantoprazole as needed only Continue same

## 2016-03-11 NOTE — Assessment & Plan Note (Signed)
Following with pulmonary Uses Breo daily and albuterol as needed

## 2016-03-22 ENCOUNTER — Encounter: Payer: Self-pay | Admitting: Internal Medicine

## 2016-04-05 DIAGNOSIS — L570 Actinic keratosis: Secondary | ICD-10-CM | POA: Diagnosis not present

## 2016-04-05 DIAGNOSIS — L821 Other seborrheic keratosis: Secondary | ICD-10-CM | POA: Diagnosis not present

## 2016-04-05 DIAGNOSIS — Z808 Family history of malignant neoplasm of other organs or systems: Secondary | ICD-10-CM | POA: Diagnosis not present

## 2016-04-05 DIAGNOSIS — Z23 Encounter for immunization: Secondary | ICD-10-CM | POA: Diagnosis not present

## 2016-05-11 DIAGNOSIS — R300 Dysuria: Secondary | ICD-10-CM | POA: Diagnosis not present

## 2016-05-11 DIAGNOSIS — N393 Stress incontinence (female) (male): Secondary | ICD-10-CM | POA: Diagnosis not present

## 2016-06-03 ENCOUNTER — Other Ambulatory Visit: Payer: Self-pay | Admitting: Internal Medicine

## 2016-06-03 NOTE — Telephone Encounter (Signed)
Ok to refill total 6 months 

## 2016-06-03 NOTE — Telephone Encounter (Signed)
Pt requesting refill on Alprazolam 0.25mg  tab. Last refill: 08/13/2015-  #60 with 5 refills, take 1-2 tabs TID PRN.  CY ok to refill?  thanks

## 2016-06-29 ENCOUNTER — Telehealth: Payer: Self-pay | Admitting: Internal Medicine

## 2016-06-29 DIAGNOSIS — Z6835 Body mass index (BMI) 35.0-35.9, adult: Secondary | ICD-10-CM | POA: Diagnosis not present

## 2016-06-29 DIAGNOSIS — Z130 Encounter for screening for diseases of the blood and blood-forming organs and certain disorders involving the immune mechanism: Secondary | ICD-10-CM | POA: Diagnosis not present

## 2016-06-29 DIAGNOSIS — Z1231 Encounter for screening mammogram for malignant neoplasm of breast: Secondary | ICD-10-CM | POA: Diagnosis not present

## 2016-06-29 DIAGNOSIS — N952 Postmenopausal atrophic vaginitis: Secondary | ICD-10-CM | POA: Insufficient documentation

## 2016-06-29 DIAGNOSIS — Z1389 Encounter for screening for other disorder: Secondary | ICD-10-CM | POA: Diagnosis not present

## 2016-06-29 DIAGNOSIS — Z01419 Encounter for gynecological examination (general) (routine) without abnormal findings: Secondary | ICD-10-CM | POA: Diagnosis not present

## 2016-06-29 DIAGNOSIS — Z124 Encounter for screening for malignant neoplasm of cervix: Secondary | ICD-10-CM | POA: Diagnosis not present

## 2016-06-29 DIAGNOSIS — Z1151 Encounter for screening for human papillomavirus (HPV): Secondary | ICD-10-CM | POA: Diagnosis not present

## 2016-06-29 LAB — HM PAP SMEAR: HM PAP: NEGATIVE

## 2016-06-29 MED ORDER — SUVOREXANT 20 MG PO TABS
20.0000 mg | ORAL_TABLET | Freq: Every day | ORAL | 0 refills | Status: DC
Start: 2016-06-29 — End: 2016-10-05

## 2016-06-29 NOTE — Telephone Encounter (Signed)
Per CY, please give patient Belsomra 20mg  samples, take 1 nightly, as a trial. Samples given to patient. Nothing further needed.

## 2016-07-04 ENCOUNTER — Other Ambulatory Visit: Payer: Self-pay | Admitting: Internal Medicine

## 2016-07-04 NOTE — Telephone Encounter (Signed)
Patient is asking for a refill of triazolam 0.25mg . Last refill was on 01/04/16 for 5 refills. Last OV was on 01/03/13.   Dr. Maple HudsonYoung, is it ok for her to receive another refill?

## 2016-07-04 NOTE — Telephone Encounter (Signed)
Ok to refill x 6 months 

## 2016-07-08 ENCOUNTER — Encounter: Payer: Self-pay | Admitting: Internal Medicine

## 2016-07-28 ENCOUNTER — Other Ambulatory Visit: Payer: Self-pay | Admitting: Internal Medicine

## 2016-09-19 ENCOUNTER — Other Ambulatory Visit: Payer: Self-pay | Admitting: Internal Medicine

## 2016-09-19 NOTE — Telephone Encounter (Signed)
Ok to refill x 6 months 

## 2016-09-19 NOTE — Telephone Encounter (Signed)
CY Please advise on refill request. Thanks.  

## 2016-10-05 ENCOUNTER — Ambulatory Visit (INDEPENDENT_AMBULATORY_CARE_PROVIDER_SITE_OTHER)
Admission: RE | Admit: 2016-10-05 | Discharge: 2016-10-05 | Disposition: A | Discharge status: Home / Self Care | Payer: 59 | Source: Ambulatory Visit | Attending: Internal Medicine | Admitting: Internal Medicine

## 2016-10-05 ENCOUNTER — Encounter: Payer: Self-pay | Admitting: Internal Medicine

## 2016-10-05 ENCOUNTER — Ambulatory Visit (INDEPENDENT_AMBULATORY_CARE_PROVIDER_SITE_OTHER): Payer: 59 | Admitting: Internal Medicine

## 2016-10-05 VITALS — BP 118/88 | HR 76 | Ht 62.0 in | Wt 194.8 lb

## 2016-10-05 DIAGNOSIS — J4520 Mild intermittent asthma, uncomplicated: Secondary | ICD-10-CM

## 2016-10-05 DIAGNOSIS — R0600 Dyspnea, unspecified: Secondary | ICD-10-CM

## 2016-10-05 DIAGNOSIS — R0602 Shortness of breath: Secondary | ICD-10-CM | POA: Diagnosis not present

## 2016-10-05 DIAGNOSIS — F5104 Psychophysiologic insomnia: Secondary | ICD-10-CM

## 2016-10-05 DIAGNOSIS — J4531 Mild persistent asthma with (acute) exacerbation: Secondary | ICD-10-CM

## 2016-10-05 DIAGNOSIS — R0609 Other forms of dyspnea: Secondary | ICD-10-CM

## 2016-10-05 MED ORDER — UMECLIDINIUM-VILANTEROL 62.5-25 MCG/INH IN AEPB: 1.0000 | INHALATION_SPRAY | Freq: Every day | RESPIRATORY_TRACT | 0 refills | Status: DC

## 2016-10-05 NOTE — Assessment & Plan Note (Signed)
This is been a problem especially since the death of her daughter who survived several years after heart transplant. She has had counseling. I think she will need a trained mental health person to help her wean off of these anxiolytic and hypnotic meds eventually.

## 2016-10-05 NOTE — Patient Instructions (Signed)
Order- CXR   Dx dyspnea on exertion, asthma  Order-  Schedule PFT   Dx asthma mild persistent  Sample x 2  Anoro  Ellipta    Inhale 1 puff, once daily      Try this instead of Breo   Please call as needed

## 2016-10-05 NOTE — Progress Notes (Signed)
[birth year]- [age] yoF former smoker followed for chronic insomnia, complicated by hx asthma, PAT, +PPD FOLLOWS FOR: continues to have trouble falling asleep and staying asleep. Waking every hour. Daughter died earlier this year, 10 years after heart transplant. Has used Halcion and clonazepam in recent years. Gradually developing tolerance, or her stress level is worse. Using Xanax 0.25 mg 3 times daily if needed, which helps her during the day. Lunesta did not work well. Not known to snore but in separate bedrooms.  10/05/16- [age] yoF  former smoker followed for chronic insomnia, complicated by hx asthma, PAT, +PPD, GERD, Anxiety FOLLOW UP FOR Chronic Insomnia, as long as she takes her medication her sleeping is fine. patient states that now she is having SOB  for about 6 months     Shortness of breath with rest and exertion. Some wheezing and dry cough. Using rescue inhaler twice daily while continuing Breo. Aware that weight gain contributes. May be worse in pollen season but not having much rhinitis. Denies edema, palpitation or chest pain Triggers include perfumes, smoke and strong odors.. Chronic insomnia continues, controlled with clonazepam and either Halcion or Xanax or lorazepam at bedtime. Denies daytime carryover. Says she doesn't sleep at all if she runs out. Still makes references to the death of her daughter several years ago  ROS-see HPI    + = pos Constitutional:   No-   weight loss, night sweats, fevers, chills, fatigue, lassitude. HEENT:   No-  headaches, difficulty swallowing, tooth/dental problems, sore throat,       No-  sneezing, itching, ear ache, nasal congestion, post nasal drip,  CV:  No-   chest pain, orthopnea, PND, swelling in lower extremities, anasarca, dizziness, palpitations Resp: +  shortness of breath with exertion or at rest.              No-   productive cough,  +non-productive cough,  No- coughing up of blood.              No-   change in color of mucus.  +-  wheezing.   Skin: No-   rash or lesions. GI:  No-   heartburn, indigestion, abdominal pain, nausea, vomiting, GU:. MS:  No-   joint pain or swelling.  No- decreased range of motion.  Some- back pain. Neuro-     nothing unusual Psych:  No- change in mood or affect. + depression or anxiety.  No memory loss.  OBJ- Physical Exam General- Alert, Oriented, Affect-appropriate, Distress- none acute, + obese Skin- rash-none, lesions- none, excoriation- none Lymphadenopathy- none Head- atraumatic            Eyes- Gross vision intact, PERRLA, conjunctivae and secretions clear            Ears- Hearing, canals-normal            Nose- Clear, no-Septal dev, mucus, polyps, erosion, perforation             Throat- Mallampati III , mucosa clear , drainage- none, tonsils- atrophic Neck- flexible , trachea midline, no stridor , thyroid nl, carotid no bruit Chest - symmetrical excursion , unlabored           Heart/CV- RRR , no murmur , no gallop  , no rub, nl s1 s2                           - JVD- none , edema- none, stasis changes- none, varices- none  Lung- clear to P&A, wheeze- none, cough- none , dullness-none, rub- none           Chest wall-  Abd-  Br/ Gen/ Rectal- Not done, not indicated Extrem- cyanosis- none, clubbing, none, atrophy- none, strength- nl Neuro- grossly intact to observation

## 2016-10-14 ENCOUNTER — Ambulatory Visit: Payer: 59 | Admitting: Internal Medicine

## 2016-10-20 ENCOUNTER — Telehealth: Payer: Self-pay | Admitting: Internal Medicine

## 2016-10-20 MED ORDER — DOXYCYCLINE HYCLATE 100 MG PO TABS: ORAL_TABLET | ORAL | 0 refills | Status: DC

## 2016-10-20 NOTE — Telephone Encounter (Signed)
Pt stated that she feels that she has bronchitis.  Congestion, cough that is non productive, feels tight in her chest.  Wheezing. No fever.  zpak does not help her.  Requesting something else other than zpak please. CY please advise.  thanks  Last ov--10/05/16 Next ov--10/06/17  Allergies  Allergen Reactions  . Compazine   . Prochlorperazine Edisylate

## 2016-10-20 NOTE — Telephone Encounter (Signed)
Suggest doxycycline 100 mg, # 8, 2 today then one daily   Suggest she use a Breo 100 or 20 inhaler sample       Inhale 1 puff then rinse mouth, once daily

## 2016-10-20 NOTE — Telephone Encounter (Signed)
Spoke with pt, aware of recs.  rx sent to pharmacy.  Nothing further needed.  

## 2016-11-07 ENCOUNTER — Ambulatory Visit (INDEPENDENT_AMBULATORY_CARE_PROVIDER_SITE_OTHER): Payer: 59 | Admitting: Internal Medicine

## 2016-11-07 DIAGNOSIS — J4520 Mild intermittent asthma, uncomplicated: Secondary | ICD-10-CM

## 2016-11-07 LAB — PULMONARY FUNCTION TEST
DL/VA % PRED: 83 %
DL/VA: 3.8 ml/min/mmHg/L
DLCO COR: 17.81 ml/min/mmHg
DLCO UNC % PRED: 85 %
DLCO UNC: 18.53 ml/min/mmHg
DLCO cor % pred: 82 %
FEF 25-75 POST: 2.61 L/s
FEF 25-75 PRE: 1.85 L/s
FEF2575-%CHANGE-POST: 40 %
FEF2575-%PRED-POST: 120 %
FEF2575-%PRED-PRE: 85 %
FEV1-%CHANGE-POST: 8 %
FEV1-%PRED-POST: 108 %
FEV1-%Pred-Pre: 100 %
FEV1-Post: 2.52 L
FEV1-Pre: 2.33 L
FEV1FVC-%Change-Post: 5 %
FEV1FVC-%PRED-PRE: 97 %
FEV6-%CHANGE-POST: 2 %
FEV6-%PRED-POST: 108 %
FEV6-%Pred-Pre: 105 %
FEV6-Post: 3.14 L
FEV6-Pre: 3.06 L
FEV6FVC-%Pred-Post: 104 %
FEV6FVC-%Pred-Pre: 104 %
FVC-%Change-Post: 2 %
FVC-%Pred-Post: 104 %
FVC-%Pred-Pre: 101 %
FVC-Post: 3.14 L
FVC-Pre: 3.06 L
POST FEV1/FVC RATIO: 80 %
PRE FEV1/FVC RATIO: 76 %
Post FEV6/FVC ratio: 100 %
Pre FEV6/FVC Ratio: 100 %
RV % pred: 124 %
RV: 2.4 L
TLC % pred: 118 %
TLC: 5.61 L

## 2016-11-07 NOTE — Progress Notes (Signed)
PFT done today. 

## 2016-11-10 ENCOUNTER — Telehealth: Payer: Self-pay | Admitting: Internal Medicine

## 2016-11-10 MED ORDER — UMECLIDINIUM-VILANTEROL 62.5-25 MCG/INH IN AEPB: 1.0000 | INHALATION_SPRAY | Freq: Every day | RESPIRATORY_TRACT | 6 refills | Status: DC

## 2016-11-10 NOTE — Telephone Encounter (Signed)
Spoke with patient-aware of PFT results. Wanted to know if she should stay on Anoro as rx'd as sample by CY. Per CY if patient is doing well with Anoro and can tell a difference then yes okay to stay on it.    Pt stated she is doing well with Anoro and would like Rx sent to [facility] outpatient pharmacy. This has been sent and patient is aware. Nothing further needed at this time.

## 2016-11-11 ENCOUNTER — Other Ambulatory Visit: Payer: Self-pay | Admitting: Internal Medicine

## 2016-11-11 MED ORDER — TIOTROPIUM BROMIDE-OLODATEROL 2.5-2.5 MCG/ACT IN AERS: 2.0000 | INHALATION_SPRAY | Freq: Every day | RESPIRATORY_TRACT | 3 refills | Status: DC

## 2016-11-11 NOTE — Telephone Encounter (Signed)
PA received for the Anoro--pt must try and fail stiolto.  Per CY--ok to send in the stiolto 2 puffs once daily.  Pt is aware that this has been sent in to the pharmacy.  Nothing further is needed.

## 2016-11-25 ENCOUNTER — Telehealth: Payer: Self-pay | Admitting: Internal Medicine

## 2016-11-25 NOTE — Telephone Encounter (Signed)
Suggest stop Stiolto  Try Symbicort 160  Or Dulera 200   If her office can provide a sample          Inhale 2 puffs, then rinse mouth, twice daily

## 2016-11-25 NOTE — Telephone Encounter (Signed)
Spoke with patient. She stated that she has already left for the day and will call back Monday for medication names.

## 2016-11-25 NOTE — Telephone Encounter (Signed)
Called and spoke with pt and she stated that the stiolto is not working well for her and she is coughing more in the afternoon and making her hoarse.  She is wanting to see what CY recs.  Please advise. Thanks

## 2016-11-28 NOTE — Telephone Encounter (Signed)
Spoke with pt. She is aware of CY's response. States that she will have Misty or Lamar LaundrySonya give her samples from our [place] office. Nothing further was needed.

## 2017-01-03 ENCOUNTER — Telehealth: Payer: Self-pay | Admitting: Internal Medicine

## 2017-01-03 MED ORDER — UMECLIDINIUM-VILANTEROL 62.5-25 MCG/INH IN AEPB: 1.0000 | INHALATION_SPRAY | Freq: Every day | RESPIRATORY_TRACT | 6 refills | Status: DC

## 2017-01-03 NOTE — Telephone Encounter (Signed)
Ok we need to keep track in case we need to Prior Auth some day. She has tried Physiological scientistymbicort and Stiolto. If she can get samples of Dulera 200, 2 puffs and rinse mouth, twice daily, then try that.  If she can get enough samples of Anoro and stay on that,  1 puff, once daily, and it works ok, theat's alright with me.

## 2017-01-03 NOTE — Telephone Encounter (Signed)
Spoke with patient. She stated that the Symbicort 160 has not been working well for her. She is still coughing and having to use her rescue inhaler more as well as clearing her throat. She stated that her wheezing has gotten worse to the point where people around her can hear.   She wants to know if she can go back on the Anoro.   CY, please advise. Thanks!

## 2017-01-03 NOTE — Telephone Encounter (Signed)
Spoke with patient-states she can get Anoro at pharmacy with coupon-would like to have Anoro. Pt is aware that I have sent Rx to [facility] pharmacy and will use coupon. If any problems she will let us know. [place] does not get samples any longer.

## 2017-01-04 ENCOUNTER — Other Ambulatory Visit: Payer: Self-pay | Admitting: Internal Medicine

## 2017-01-04 NOTE — Telephone Encounter (Signed)
CY Please advise on refill. Thanks.  

## 2017-01-05 NOTE — Telephone Encounter (Signed)
Ok refill total 6 months 

## 2017-01-27 ENCOUNTER — Other Ambulatory Visit: Payer: Self-pay | Admitting: Internal Medicine

## 2017-01-27 NOTE — Telephone Encounter (Signed)
CY Please advise on refill. Thanks.  

## 2017-01-27 NOTE — Telephone Encounter (Signed)
Ok refill x 6 months 

## 2017-03-08 ENCOUNTER — Telehealth: Payer: Self-pay | Admitting: Internal Medicine

## 2017-03-08 MED ORDER — NEBIVOLOL HCL 20 MG PO TABS: 0.5000 | ORAL_TABLET | Freq: Every day | ORAL | 0 refills | Status: DC

## 2017-03-08 NOTE — Telephone Encounter (Signed)
Per office policy sent 30 day to local pharmacy until appt.../lmb  

## 2017-03-08 NOTE — Telephone Encounter (Signed)
Patient is almost out of bystolic.  Is requesting med refill until CPE on 11/7.  [facility].

## 2017-03-28 NOTE — Patient Instructions (Addendum)
  All other Health Maintenance issues reviewed.   All recommended immunizations and age-appropriate screenings are up-to-date or discussed.  No immunizations administered today.   Medications reviewed and updated.  No changes recommended at this time.   Please followup next year for a physical

## 2017-03-28 NOTE — Progress Notes (Signed)
Subjective:    Patient ID: Carrie Dalton, female    DOB: [birth year], 61 y.o.   MRN: [id number]  HPI The patient is here for follow up.  Hypertension: She is taking her medication daily. She is compliant with a low sodium diet.  She denies chest pain, palpitations, edema and regular headaches. She is not exercising regularly.  She does not monitor her blood pressure at home.    GERD:  She is taking her medication daily as prescribed.  She denies any GERD symptoms and feels her GERD is well controlled.   Hyperglycemia:  She is eating fairly healthy.  She is not exercising regularly, but did join the gym.   Medications and allergies reviewed with patient and updated if appropriate.  Patient Active Problem List   Diagnosis Date Noted  . Essential hypertension, benign 03/11/2016  . GERD (gastroesophageal reflux disease) 03/11/2016  . Generalized anxiety disorder 02/17/2015  . Obesity 02/17/2015  . Osteopenia 11/26/2009  . Asthma, mild persistent 11/05/2009  . NECK PAIN 11/05/2009  . HYPERCHOLESTEROLEMIA, BORDERLINE 08/28/2008  . UNSPECIFIED PAROXYSMAL TACHYCARDIA 08/28/2008  . POSITIVE PPD 08/28/2008  . DEGENERATIVE JOINT DISEASE 09/06/2007  . Chronic insomnia 09/06/2007  . EDEMA 08/27/2007    Current Outpatient Medications on File Prior to Visit  Medication Sig Dispense Refill  . albuterol (PROVENTIL HFA;VENTOLIN HFA) 108 (90 Base) MCG/ACT inhaler Inhale 2 puffs into the lungs every 6 (six) hours as needed for wheezing or shortness of breath. 1 Inhaler 12  . ALPRAZolam (XANAX) 0.25 MG tablet TAKE 1 TO 2 TABLETS BY MOUTH 3 TIMES DAILY AS NEEDED 60 tablet 5  . aspirin 81 MG tablet Take 81 mg by mouth daily.      . cholecalciferol (VITAMIN D) 1000 UNITS tablet Take 1,000 Units by mouth daily.      . clonazePAM (KLONOPIN) 0.5 MG tablet TAKE 1 TO 3 TABLETS BY MOUTH NIGHTLY AT BEDTIME AS NEEDED 90 tablet 5  . cyclobenzaprine (FLEXERIL) 10 MG tablet Take 1 tablet (10 mg total) by  mouth 3 (three) times daily as needed. 90 tablet 0  . fluticasone furoate-vilanterol (BREO ELLIPTA) 100-25 MCG/INH AEPB Inhale 1 puff into the lungs daily. 60 each 12  . LORazepam (ATIVAN) 1 MG tablet TAKE 1 TABLET BY MOUTH EVERY 8 HOURS AS NEEDED FOR ANXIETY 90 tablet 5  . Nebivolol HCl (BYSTOLIC) 20 MG TABS Take 0.5 tablets (10 mg total) by mouth daily. Must keep Nov appt for future refills 30 tablet 0  . pantoprazole (PROTONIX) 40 MG tablet Take 1 tablet (40 mg total) by mouth daily. 90 tablet 3  . Tiotropium Bromide-Olodaterol (STIOLTO RESPIMAT) 2.5-2.5 MCG/ACT AERS Inhale 2 puffs into the lungs daily. 1 Inhaler 3  . triazolam (HALCION) 0.25 MG tablet TAKE 1 TABLET BY MOUTH NIGHTLY AT BEDTIME AS NEEDED 30 tablet 5  . umeclidinium-vilanterol (ANORO ELLIPTA) 62.5-25 MCG/INH AEPB Inhale 1 puff into the lungs daily. 1 each 6   No current facility-administered medications on file prior to visit.     Past Medical History:  Diagnosis Date  . ASTHMA, UNSPECIFIED, UNSPECIFIED STATUS   . DEGENERATIVE JOINT DISEASE   . HYPERCHOLESTEROLEMIA, BORDERLINE   . NECK PAIN 11/05/2009  . OSTEOPENIA   . POSITIVE PPD   . SLEEP DISORDER, HX OF   . UNSPECIFIED PAROXYSMAL TACHYCARDIA     Past Surgical History:  Procedure Laterality Date  . CESAREAN SECTION  1984  . DILATION AND CURETTAGE OF UTERUS     x 3  Social History   Socioeconomic History  . Marital status: Married    Spouse name: Not on file  . Number of children: 3  . Years of education: Not on file  . Highest education level: Not on file  Social Needs  . Financial resource strain: Not on file  . Food insecurity - worry: Not on file  . Food insecurity - inability: Not on file  . Transportation needs - medical: Not on file  . Transportation needs - non-medical: Not on file  Occupational History  . Occupation: PATIENT CARE COORDIN    Employer: [employer]  Tobacco Use  . Smoking status: Former Smoker    Last attempt to  quit: 05/23/2000    Years since quitting: 16.8  . Smokeless tobacco: Never Used  Substance and Sexual Activity  . Alcohol use: No  . Drug use: No  . Sexual activity: Not on file  Other Topics Concern  . Not on file  Social History Narrative  . Not on file    Family History  Problem Relation Age of Onset  . Diabetes Father   . Hypertension Father   . Stroke Mother   . Atrial fibrillation Mother   . Prostate cancer Maternal Grandfather   . Atrial fibrillation Maternal Grandmother   . Heart failure Maternal Grandmother        CHF  . Hypertension Maternal Grandmother   . Heart disease Daughter        heart transplant  . Pancreatitis Daughter     Review of Systems  Constitutional: Negative for chills and fever.  Respiratory: Positive for shortness of breath (from asthma - certain activities, weather changes). Negative for cough and wheezing.   Cardiovascular: Negative for chest pain, palpitations and leg swelling.  Neurological: Negative for light-headedness and headaches.       Objective:   Vitals:   03/29/17 0949  BP: 136/78  Pulse: 65  Resp: 16  Temp: 99 F (37.2 C)  SpO2: 97%   Wt Readings from Last 3 Encounters:  03/29/17 203 lb (92.1 kg)  10/05/16 194 lb 12.8 oz (88.4 kg)  03/11/16 195 lb (88.5 kg)   Body mass index is 37.13 kg/m.   Physical Exam    Constitutional: Appears well-developed and well-nourished. No distress.  HENT:  Head: Normocephalic and atraumatic.  Neck: Neck supple. No tracheal deviation present. No thyromegaly present.  No cervical lymphadenopathy Cardiovascular: Normal rate, regular rhythm and normal heart sounds.   No murmur heard. No carotid bruit .  No edema Pulmonary/Chest: Effort normal and breath sounds normal. No respiratory distress. No has no wheezes. No rales.  Skin: Skin is warm and dry. Not diaphoretic.  Psychiatric: Normal mood and affect. Behavior is normal.      Assessment & Plan:    See Problem List for  Assessment and Plan of chronic medical problems.

## 2017-03-29 ENCOUNTER — Other Ambulatory Visit: Payer: Self-pay | Admitting: Internal Medicine

## 2017-03-29 ENCOUNTER — Encounter: Payer: Self-pay | Admitting: Internal Medicine

## 2017-03-29 ENCOUNTER — Ambulatory Visit (INDEPENDENT_AMBULATORY_CARE_PROVIDER_SITE_OTHER): Payer: 59 | Admitting: Internal Medicine

## 2017-03-29 ENCOUNTER — Encounter: Payer: 59 | Admitting: Internal Medicine

## 2017-03-29 VITALS — BP 136/78 | HR 65 | Temp 99.0°F | Resp 16 | Wt 203.0 lb

## 2017-03-29 DIAGNOSIS — R739 Hyperglycemia, unspecified: Secondary | ICD-10-CM | POA: Diagnosis not present

## 2017-03-29 DIAGNOSIS — R7303 Prediabetes: Secondary | ICD-10-CM | POA: Insufficient documentation

## 2017-03-29 DIAGNOSIS — I10 Essential (primary) hypertension: Secondary | ICD-10-CM | POA: Diagnosis not present

## 2017-03-29 DIAGNOSIS — K219 Gastro-esophageal reflux disease without esophagitis: Secondary | ICD-10-CM | POA: Diagnosis not present

## 2017-03-29 MED ORDER — PANTOPRAZOLE SODIUM 40 MG PO TBEC: 40.0000 mg | DELAYED_RELEASE_TABLET | Freq: Every day | ORAL | 3 refills | Status: DC

## 2017-03-29 MED ORDER — NEBIVOLOL HCL 20 MG PO TABS: 0.5000 | ORAL_TABLET | Freq: Every day | ORAL | 1 refills | Status: DC

## 2017-03-29 MED ORDER — CYCLOBENZAPRINE HCL 10 MG PO TABS: 10.0000 mg | ORAL_TABLET | Freq: Three times a day (TID) | ORAL | 0 refills | Status: DC | PRN

## 2017-03-29 MED ORDER — FLUTICASONE-UMECLIDIN-VILANT 100-62.5-25 MCG/INH IN AEPB: 1.0000 | INHALATION_SPRAY | Freq: Every day | RESPIRATORY_TRACT | 0 refills | Status: DC

## 2017-03-29 NOTE — Assessment & Plan Note (Addendum)
Check a1c at her CPE next year Low sugar / carb diet Stressed regular exercise, weight loss

## 2017-03-29 NOTE — Assessment & Plan Note (Signed)
GERD controlled Continue daily medication  

## 2017-03-29 NOTE — Assessment & Plan Note (Signed)
BP well controlled Current regimen effective and well tolerated Continue current medications at current doses cmp  

## 2017-04-07 ENCOUNTER — Telehealth: Payer: Self-pay | Admitting: *Deleted

## 2017-04-07 MED ORDER — NEBIVOLOL HCL 10 MG PO TABS: 10.0000 mg | ORAL_TABLET | Freq: Every day | ORAL | 0 refills | Status: DC

## 2017-04-07 NOTE — Telephone Encounter (Signed)
Rec'd call pt states she has been taking a whole Bystolic not realizing that the direction on the bottle was to take 1/2 tab. Now she is almost out and the insurance will not approve another refill until Dec 1st. Inform pt will leave some Bystolic 10 mg samples to pick-up...Carrie Dalton/lmb

## 2017-04-26 ENCOUNTER — Other Ambulatory Visit: Payer: Self-pay | Admitting: Internal Medicine

## 2017-04-27 NOTE — Telephone Encounter (Signed)
CY Please advise on refill. Thanks.  

## 2017-04-27 NOTE — Telephone Encounter (Signed)
Ok refill total 6 months 

## 2017-05-03 ENCOUNTER — Ambulatory Visit
Admission: RE | Admit: 2017-05-03 | Discharge: 2017-05-03 | Disposition: A | Discharge status: Home / Self Care | Payer: 59 | Source: Ambulatory Visit | Attending: Internal Medicine | Admitting: Internal Medicine

## 2017-05-03 ENCOUNTER — Other Ambulatory Visit: Payer: Self-pay | Admitting: *Deleted

## 2017-05-03 DIAGNOSIS — S82831A Other fracture of upper and lower end of right fibula, initial encounter for closed fracture: Secondary | ICD-10-CM | POA: Diagnosis not present

## 2017-05-03 DIAGNOSIS — X58XXXA Exposure to other specified factors, initial encounter: Secondary | ICD-10-CM | POA: Insufficient documentation

## 2017-05-03 DIAGNOSIS — R609 Edema, unspecified: Secondary | ICD-10-CM | POA: Diagnosis not present

## 2017-05-03 DIAGNOSIS — M7989 Other specified soft tissue disorders: Secondary | ICD-10-CM | POA: Diagnosis not present

## 2017-05-03 DIAGNOSIS — S99921A Unspecified injury of right foot, initial encounter: Secondary | ICD-10-CM | POA: Diagnosis not present

## 2017-05-03 DIAGNOSIS — S82441A Displaced spiral fracture of shaft of right fibula, initial encounter for closed fracture: Secondary | ICD-10-CM | POA: Diagnosis not present

## 2017-05-03 DIAGNOSIS — M79671 Pain in right foot: Secondary | ICD-10-CM | POA: Diagnosis not present

## 2017-05-04 ENCOUNTER — Other Ambulatory Visit: Payer: Self-pay

## 2017-05-04 ENCOUNTER — Encounter (HOSPITAL_BASED_OUTPATIENT_CLINIC_OR_DEPARTMENT_OTHER): Payer: Self-pay | Admitting: *Deleted

## 2017-05-08 ENCOUNTER — Encounter (HOSPITAL_BASED_OUTPATIENT_CLINIC_OR_DEPARTMENT_OTHER)
Admission: RE | Admit: 2017-05-08 | Discharge: 2017-05-08 | Disposition: A | Discharge status: Home / Self Care | Payer: 59 | Source: Ambulatory Visit | Attending: Orthopedic Surgery | Admitting: Orthopedic Surgery

## 2017-05-08 DIAGNOSIS — Z87891 Personal history of nicotine dependence: Secondary | ICD-10-CM | POA: Diagnosis not present

## 2017-05-08 DIAGNOSIS — E7800 Pure hypercholesterolemia, unspecified: Secondary | ICD-10-CM | POA: Diagnosis not present

## 2017-05-08 DIAGNOSIS — Z79899 Other long term (current) drug therapy: Secondary | ICD-10-CM | POA: Diagnosis not present

## 2017-05-08 DIAGNOSIS — F419 Anxiety disorder, unspecified: Secondary | ICD-10-CM | POA: Diagnosis not present

## 2017-05-08 DIAGNOSIS — Z7982 Long term (current) use of aspirin: Secondary | ICD-10-CM | POA: Diagnosis not present

## 2017-05-08 DIAGNOSIS — M1990 Unspecified osteoarthritis, unspecified site: Secondary | ICD-10-CM | POA: Diagnosis not present

## 2017-05-08 DIAGNOSIS — S8261XA Displaced fracture of lateral malleolus of right fibula, initial encounter for closed fracture: Secondary | ICD-10-CM | POA: Diagnosis not present

## 2017-05-08 DIAGNOSIS — J45909 Unspecified asthma, uncomplicated: Secondary | ICD-10-CM | POA: Diagnosis not present

## 2017-05-08 DIAGNOSIS — I10 Essential (primary) hypertension: Secondary | ICD-10-CM | POA: Diagnosis not present

## 2017-05-08 DIAGNOSIS — K219 Gastro-esophageal reflux disease without esophagitis: Secondary | ICD-10-CM | POA: Diagnosis not present

## 2017-05-08 NOTE — Progress Notes (Signed)
EKG reviewed by Dr. Brock, will proceed with surgery as scheduled.  

## 2017-05-09 ENCOUNTER — Ambulatory Visit (HOSPITAL_BASED_OUTPATIENT_CLINIC_OR_DEPARTMENT_OTHER): Payer: 59 | Admitting: Anesthesiology

## 2017-05-09 ENCOUNTER — Encounter (HOSPITAL_BASED_OUTPATIENT_CLINIC_OR_DEPARTMENT_OTHER): Payer: Self-pay | Admitting: Anesthesiology

## 2017-05-09 ENCOUNTER — Encounter (HOSPITAL_BASED_OUTPATIENT_CLINIC_OR_DEPARTMENT_OTHER)
Admission: RE | Disposition: A | Discharge status: Home / Self Care | Payer: Self-pay | Source: Ambulatory Visit | Attending: Orthopedic Surgery

## 2017-05-09 ENCOUNTER — Ambulatory Visit (HOSPITAL_BASED_OUTPATIENT_CLINIC_OR_DEPARTMENT_OTHER)
Admission: RE | Admit: 2017-05-09 | Discharge: 2017-05-09 | Disposition: A | Discharge status: Home / Self Care | Payer: 59 | Source: Ambulatory Visit | Attending: Orthopedic Surgery | Admitting: Orthopedic Surgery

## 2017-05-09 ENCOUNTER — Other Ambulatory Visit: Payer: Self-pay

## 2017-05-09 DIAGNOSIS — S8261XA Displaced fracture of lateral malleolus of right fibula, initial encounter for closed fracture: Secondary | ICD-10-CM | POA: Insufficient documentation

## 2017-05-09 DIAGNOSIS — K219 Gastro-esophageal reflux disease without esophagitis: Secondary | ICD-10-CM | POA: Insufficient documentation

## 2017-05-09 DIAGNOSIS — Z79899 Other long term (current) drug therapy: Secondary | ICD-10-CM | POA: Diagnosis not present

## 2017-05-09 DIAGNOSIS — Z87891 Personal history of nicotine dependence: Secondary | ICD-10-CM | POA: Diagnosis not present

## 2017-05-09 DIAGNOSIS — J45909 Unspecified asthma, uncomplicated: Secondary | ICD-10-CM | POA: Insufficient documentation

## 2017-05-09 DIAGNOSIS — I10 Essential (primary) hypertension: Secondary | ICD-10-CM | POA: Insufficient documentation

## 2017-05-09 DIAGNOSIS — Z7982 Long term (current) use of aspirin: Secondary | ICD-10-CM | POA: Insufficient documentation

## 2017-05-09 DIAGNOSIS — E7800 Pure hypercholesterolemia, unspecified: Secondary | ICD-10-CM | POA: Insufficient documentation

## 2017-05-09 DIAGNOSIS — G8918 Other acute postprocedural pain: Secondary | ICD-10-CM | POA: Diagnosis not present

## 2017-05-09 DIAGNOSIS — S82831A Other fracture of upper and lower end of right fibula, initial encounter for closed fracture: Secondary | ICD-10-CM | POA: Diagnosis present

## 2017-05-09 DIAGNOSIS — F419 Anxiety disorder, unspecified: Secondary | ICD-10-CM | POA: Insufficient documentation

## 2017-05-09 DIAGNOSIS — M1990 Unspecified osteoarthritis, unspecified site: Secondary | ICD-10-CM | POA: Insufficient documentation

## 2017-05-09 HISTORY — DX: Family history of other specified conditions: Z84.89

## 2017-05-09 HISTORY — PX: ORIF FIBULA FRACTURE: SHX5114

## 2017-05-09 HISTORY — DX: Other fracture of upper and lower end of right fibula, initial encounter for closed fracture: S82.831A

## 2017-05-09 SURGERY — OPEN REDUCTION INTERNAL FIXATION (ORIF) FIBULA FRACTURE
Anesthesia: General | Site: Leg Lower | Laterality: Right

## 2017-05-09 MED ORDER — CEFAZOLIN SODIUM-DEXTROSE 2-4 GM/100ML-% IV SOLN
2.0000 g | INTRAVENOUS | Status: AC
  Administered 2017-05-09: 2 g via INTRAVENOUS

## 2017-05-09 MED ORDER — FENTANYL CITRATE (PF) 100 MCG/2ML IJ SOLN
INTRAMUSCULAR | Status: AC
  Filled 2017-05-09: qty 2

## 2017-05-09 MED ORDER — LACTATED RINGERS IV SOLN: INTRAVENOUS | Status: DC

## 2017-05-09 MED ORDER — HYDROCODONE-ACETAMINOPHEN 10-325 MG PO TABS: 1.0000 | ORAL_TABLET | Freq: Four times a day (QID) | ORAL | 0 refills | Status: DC | PRN

## 2017-05-09 MED ORDER — ONDANSETRON HCL 4 MG/2ML IJ SOLN
INTRAMUSCULAR | Status: DC | PRN
  Administered 2017-05-09: 4 mg via INTRAVENOUS

## 2017-05-09 MED ORDER — MIDAZOLAM HCL 2 MG/2ML IJ SOLN
INTRAMUSCULAR | Status: AC
Start: 2017-05-09 — End: 2017-05-09
  Filled 2017-05-09: qty 2

## 2017-05-09 MED ORDER — MIDAZOLAM HCL 2 MG/2ML IJ SOLN
INTRAMUSCULAR | Status: AC
  Filled 2017-05-09: qty 2

## 2017-05-09 MED ORDER — OXYCODONE HCL 5 MG PO TABS: 5.0000 mg | ORAL_TABLET | Freq: Once | ORAL | Status: DC | PRN

## 2017-05-09 MED ORDER — LACTATED RINGERS IV SOLN
INTRAVENOUS | Status: DC
  Administered 2017-05-09 (×2): via INTRAVENOUS

## 2017-05-09 MED ORDER — PROMETHAZINE HCL 25 MG/ML IJ SOLN: 6.2500 mg | INTRAMUSCULAR | Status: DC | PRN

## 2017-05-09 MED ORDER — LIDOCAINE HCL (CARDIAC) 20 MG/ML IV SOLN
INTRAVENOUS | Status: DC | PRN
  Administered 2017-05-09: 60 mg via INTRAVENOUS

## 2017-05-09 MED ORDER — MEPERIDINE HCL 25 MG/ML IJ SOLN: 6.2500 mg | INTRAMUSCULAR | Status: DC | PRN

## 2017-05-09 MED ORDER — SCOPOLAMINE 1 MG/3DAYS TD PT72: 1.0000 | MEDICATED_PATCH | Freq: Once | TRANSDERMAL | Status: DC | PRN

## 2017-05-09 MED ORDER — PROPOFOL 10 MG/ML IV BOLUS
INTRAVENOUS | Status: AC
  Filled 2017-05-09: qty 20

## 2017-05-09 MED ORDER — OXYCODONE HCL 5 MG/5ML PO SOLN: 5.0000 mg | Freq: Once | ORAL | Status: DC | PRN

## 2017-05-09 MED ORDER — FENTANYL CITRATE (PF) 100 MCG/2ML IJ SOLN
50.0000 ug | INTRAMUSCULAR | Status: DC | PRN
  Administered 2017-05-09 (×2): 50 ug via INTRAVENOUS

## 2017-05-09 MED ORDER — PROPOFOL 10 MG/ML IV BOLUS
INTRAVENOUS | Status: DC | PRN
  Administered 2017-05-09: 200 mg via INTRAVENOUS

## 2017-05-09 MED ORDER — MIDAZOLAM HCL 2 MG/2ML IJ SOLN
1.0000 mg | INTRAMUSCULAR | Status: DC | PRN
  Administered 2017-05-09 (×2): 2 mg via INTRAVENOUS

## 2017-05-09 MED ORDER — BUPIVACAINE-EPINEPHRINE (PF) 0.5% -1:200000 IJ SOLN
INTRAMUSCULAR | Status: DC | PRN
  Administered 2017-05-09: 30 mL via PERINEURAL

## 2017-05-09 MED ORDER — FENTANYL CITRATE (PF) 100 MCG/2ML IJ SOLN: 25.0000 ug | INTRAMUSCULAR | Status: DC | PRN

## 2017-05-09 MED ORDER — CEFAZOLIN SODIUM-DEXTROSE 2-4 GM/100ML-% IV SOLN
INTRAVENOUS | Status: AC
  Filled 2017-05-09: qty 100

## 2017-05-09 MED ORDER — DEXAMETHASONE SODIUM PHOSPHATE 10 MG/ML IJ SOLN
INTRAMUSCULAR | Status: DC | PRN
  Administered 2017-05-09: 10 mg via INTRAVENOUS

## 2017-05-09 SURGICAL SUPPLY — 80 items
BANDAGE ACE 4X5 VEL STRL LF (GAUZE/BANDAGES/DRESSINGS) ×2 IMPLANT
BANDAGE ACE 6X5 VEL STRL LF (GAUZE/BANDAGES/DRESSINGS) ×2 IMPLANT
BANDAGE ESMARK 6X9 LF (GAUZE/BANDAGES/DRESSINGS) ×1 IMPLANT
BIT DRILL 110X2.5XQCK CNCT (BIT) IMPLANT
BIT DRILL 2.5 (BIT) ×2
BIT DRILL QC 110 3.5 (BIT) ×1
BIT DRILL QC 110 3.5MM (BIT) IMPLANT
BIT DRL 110X2.5XQCK CNCT (BIT) ×1
BLADE SURG 15 STRL LF DISP TIS (BLADE) ×3 IMPLANT
BLADE SURG 15 STRL SS (BLADE) ×2
BNDG CMPR 9X6 STRL LF SNTH (GAUZE/BANDAGES/DRESSINGS) ×1
BNDG COHESIVE 4X5 TAN STRL (GAUZE/BANDAGES/DRESSINGS) ×2 IMPLANT
BNDG ESMARK 6X9 LF (GAUZE/BANDAGES/DRESSINGS) ×2
CANISTER SUCT 1200ML W/VALVE (MISCELLANEOUS) ×2 IMPLANT
CLSR STERI-STRIP ANTIMIC 1/2X4 (GAUZE/BANDAGES/DRESSINGS) ×1 IMPLANT
COVER BACK TABLE 60X90IN (DRAPES) ×2 IMPLANT
CUFF TOURNIQUET SINGLE 34IN LL (TOURNIQUET CUFF) ×1 IMPLANT
DECANTER SPIKE VIAL GLASS SM (MISCELLANEOUS) IMPLANT
DRAPE C-ARM 42X72 X-RAY (DRAPES) IMPLANT
DRAPE C-ARMOR (DRAPES) IMPLANT
DRAPE EXTREMITY T 121X128X90 (DRAPE) ×2 IMPLANT
DRAPE IMP U-DRAPE 54X76 (DRAPES) ×2 IMPLANT
DRAPE INCISE IOBAN 66X45 STRL (DRAPES) ×1 IMPLANT
DRAPE OEC MINIVIEW 54X84 (DRAPES) ×2 IMPLANT
DRAPE U-SHAPE 47X51 STRL (DRAPES) ×2 IMPLANT
DRILL BIT QC 110 3.5MM (BIT) ×2
DRSG ADAPTIC 3X8 NADH LF (GAUZE/BANDAGES/DRESSINGS) ×2 IMPLANT
DRSG PAD ABDOMINAL 8X10 ST (GAUZE/BANDAGES/DRESSINGS) ×4 IMPLANT
DURAPREP 26ML APPLICATOR (WOUND CARE) ×2 IMPLANT
ELECT REM PT RETURN 9FT ADLT (ELECTROSURGICAL) ×2
ELECTRODE REM PT RTRN 9FT ADLT (ELECTROSURGICAL) ×1 IMPLANT
GAUZE SPONGE 4X4 12PLY STRL (GAUZE/BANDAGES/DRESSINGS) ×2 IMPLANT
GLOVE BIO SURGEON STRL SZ7 (GLOVE) ×1 IMPLANT
GLOVE BIO SURGEON STRL SZ8 (GLOVE) ×2 IMPLANT
GLOVE BIOGEL PI IND STRL 8 (GLOVE) ×2 IMPLANT
GLOVE BIOGEL PI INDICATOR 8 (GLOVE) ×2
GLOVE ORTHO TXT STRL SZ7.5 (GLOVE) ×2 IMPLANT
GOWN STRL REUS W/ TWL LRG LVL3 (GOWN DISPOSABLE) ×1 IMPLANT
GOWN STRL REUS W/ TWL XL LVL3 (GOWN DISPOSABLE) ×2 IMPLANT
GOWN STRL REUS W/TWL LRG LVL3 (GOWN DISPOSABLE)
GOWN STRL REUS W/TWL XL LVL3 (GOWN DISPOSABLE) ×6
NDL HYPO 25X1 1.5 SAFETY (NEEDLE) IMPLANT
NEEDLE HYPO 25X1 1.5 SAFETY (NEEDLE) IMPLANT
NS IRRIG 1000ML POUR BTL (IV SOLUTION) ×2 IMPLANT
PACK BASIN DAY SURGERY FS (CUSTOM PROCEDURE TRAY) ×2 IMPLANT
PAD CAST 4YDX4 CTTN HI CHSV (CAST SUPPLIES) ×2 IMPLANT
PADDING CAST COTTON 4X4 STRL (CAST SUPPLIES) ×4
PENCIL BUTTON HOLSTER BLD 10FT (ELECTRODE) ×2 IMPLANT
PLATE 7HOLE 1/3 TUBULAR (Plate) ×1 IMPLANT
SCREW 3.5X12 (Screw) ×2 IMPLANT
SCREW 3.5X16MM (Screw) ×2 IMPLANT
SCREW BN 2.7X12X3.5XST NS (Screw) IMPLANT
SCREW BN 2.7X16X3.5XST NS (Screw) IMPLANT
SCREW CANC 2.5XFT 16X4XST SM (Screw) IMPLANT
SCREW CANC 4.0X16 (Screw) ×6 IMPLANT
SCREW CORTICAL 3.5X18 (Screw) ×1 IMPLANT
SCREW PERI 3.5X14MM W/2.7 (Screw) ×2 IMPLANT
SCREW PERI CORTICAL 3.5X20MM (Screw) ×1 IMPLANT
SHEET MEDIUM DRAPE 40X70 STRL (DRAPES) ×1 IMPLANT
SLEEVE SCD COMPRESS KNEE MED (MISCELLANEOUS) ×2 IMPLANT
SPLINT FAST PLASTER 5X30 (CAST SUPPLIES) ×20
SPLINT PLASTER CAST FAST 5X30 (CAST SUPPLIES) IMPLANT
SPONGE LAP 4X18 X RAY DECT (DISPOSABLE) ×2 IMPLANT
STAPLER VISISTAT 35W (STAPLE) IMPLANT
SUCTION FRAZIER HANDLE 10FR (MISCELLANEOUS) ×1
SUCTION TUBE FRAZIER 10FR DISP (MISCELLANEOUS) ×1 IMPLANT
SUT ETHILON 3 0 PS 1 (SUTURE) IMPLANT
SUT ETHILON 4 0 PS 2 18 (SUTURE) IMPLANT
SUT MNCRL AB 4-0 PS2 18 (SUTURE) IMPLANT
SUT VIC AB 0 CT1 27 (SUTURE)
SUT VIC AB 0 CT1 27XBRD ANBCTR (SUTURE) IMPLANT
SUT VIC AB 2-0 SH 18 (SUTURE) IMPLANT
SUT VIC AB 3-0 SH 27 (SUTURE)
SUT VIC AB 3-0 SH 27X BRD (SUTURE) IMPLANT
SUT VICRYL 3-0 CR8 SH (SUTURE) ×1 IMPLANT
SYR BULB 3OZ (MISCELLANEOUS) ×2 IMPLANT
SYR CONTROL 10ML LL (SYRINGE) IMPLANT
TUBE CONNECTING 20X1/4 (TUBING) ×2 IMPLANT
UNDERPAD 30X30 (UNDERPADS AND DIAPERS) ×2 IMPLANT
YANKAUER SUCT BULB TIP NO VENT (SUCTIONS) ×1 IMPLANT

## 2017-05-09 NOTE — H&P (Signed)
PREOPERATIVE H&P  Chief Complaint: DISPLACED FRACTURE OF LATERAL MALLEOLUS OF FIBULA  HPI: Carrie Dalton is a 61 y.o. female who presents for preoperative history and physical with a diagnosis of DISPLACED FRACTURE OF LATERAL MALLEOLUS OF FIBULA. Symptoms are rated as moderate to severe, and have been worsening.  This is significantly impairing activities of daily living.  She has elected for surgical management.   This occurred on May 03, 2017, pain rated 9/10, unable to bear weight.  She has had multiple previous ankle sprains, but nothing this severe.  She originally slipped on the ice which caused the fracture.  Past Medical History:  Diagnosis Date  . ASTHMA, UNSPECIFIED, UNSPECIFIED STATUS   . DEGENERATIVE JOINT DISEASE   . Family history of adverse reaction to anesthesia    ponv  . HYPERCHOLESTEROLEMIA, BORDERLINE   . NECK PAIN 11/05/2009  . OSTEOPENIA   . POSITIVE PPD   . SLEEP DISORDER, HX OF   . UNSPECIFIED PAROXYSMAL TACHYCARDIA    Past Surgical History:  Procedure Laterality Date  . CESAREAN SECTION  1984  . DILATION AND CURETTAGE OF UTERUS     x 3   Social History   Socioeconomic History  . Marital status: Married    Spouse name: None  . Number of children: 3  . Years of education: None  . Highest education level: None  Social Needs  . Financial resource strain: None  . Food insecurity - worry: None  . Food insecurity - inability: None  . Transportation needs - medical: None  . Transportation needs - non-medical: None  Occupational History  . Occupation: PATIENT CARE COORDIN    Employer: [employer]  Tobacco Use  . Smoking status: Former Smoker    Last attempt to quit: 05/23/2000    Years since quitting: 16.9  . Smokeless tobacco: Never Used  Substance and Sexual Activity  . Alcohol use: No  . Drug use: No  . Sexual activity: None  Other Topics Concern  . None  Social History Narrative  . None   Family History  Problem Relation Age of  Onset  . Diabetes Father   . Hypertension Father   . Stroke Mother   . Atrial fibrillation Mother   . Prostate cancer Maternal Grandfather   . Atrial fibrillation Maternal Grandmother   . Heart failure Maternal Grandmother        CHF  . Hypertension Maternal Grandmother   . Heart disease Daughter        heart transplant  . Pancreatitis Daughter    Allergies  Allergen Reactions  . Codeine   . Compazine   . Merthiolate [Thimerosal] Dermatitis    Itching, blisters, rash to area applied   . Nickel Dermatitis    Blistering of skin   . Prochlorperazine Edisylate    Prior to Admission medications   Medication Sig Start Date End Date Taking? Authorizing Provider  albuterol (PROVENTIL HFA;VENTOLIN HFA) 108 (90 Base) MCG/ACT inhaler Inhale 2 puffs into the lungs every 6 (six) hours as needed for wheezing or shortness of breath. 10/23/15  Yes Jetty DuhamelYoung, Clinton D, MD  aspirin 81 MG tablet Take 81 mg by mouth daily.     Yes [provider]  budesonide-formoterol (SYMBICORT) 80-4.5 MCG/ACT inhaler Inhale 2 puffs into the lungs 2 (two) times daily.   Yes [provider]  clonazePAM (KLONOPIN) 0.5 MG tablet TAKE 1 TO 3 TABLETS BY MOUTH NIGHTLY AT BEDTIME AS NEEDED 01/27/17  Yes Waymon BudgeYoung, Clinton D, MD  cyclobenzaprine (FLEXERIL) 10 MG tablet Take 1 tablet (10 mg total) 3 (three) times daily as needed by mouth. 03/29/17  Yes Burns, Bobette MoStacy J, MD  LORazepam (ATIVAN) 1 MG tablet TAKE 1 TABLET BY MOUTH EVERY 8 HOURS AS NEEDED FOR ANXIETY 04/27/17  Yes Young, Clinton D, MD  nebivolol (BYSTOLIC) 10 MG tablet Take 1 tablet (10 mg total) daily by mouth. 04/07/17  Yes Burns, Bobette MoStacy J, MD  pantoprazole (PROTONIX) 40 MG tablet Take 1 tablet (40 mg total) daily by mouth. 03/29/17  Yes Burns, Bobette MoStacy J, MD  triazolam (HALCION) 0.25 MG tablet TAKE 1 TABLET BY MOUTH NIGHTLY AT BEDTIME AS NEEDED 01/05/17  Yes Young, Clinton D, MD  ALPRAZolam (XANAX) 0.25 MG tablet TAKE 1 TO 2 TABLETS BY MOUTH 3 TIMES DAILY AS  NEEDED 06/03/16   Jetty DuhamelYoung, Clinton D, MD  cholecalciferol (VITAMIN D) 1000 UNITS tablet Take 1,000 Units by mouth daily.      [provider]     Positive ROS: All other systems have been reviewed and were otherwise negative with the exception of those mentioned in the HPI and as above.  Physical Exam: General: Alert, no acute distress Cardiovascular: No pedal edema Respiratory: No cyanosis, no use of accessory musculature GI: No organomegaly, abdomen is soft and non-tender Skin: No lesions in the area of chief complaint Neurologic: Sensation intact distally Psychiatric: Patient is competent for consent with normal mood and affect Lymphatic: No axillary or cervical lymphadenopathy  MUSCULOSKELETAL: Right ankle has positive ecchymosis laterally, sensation intact throughout the foot, EHL and FHL are intact, minimal pain medially, positive pain laterally.  Assessment: DISPLACED FRACTURE OF LATERAL MALLEOLUS OF FIBULA   Plan: Plan for Procedure(s): OPEN REDUCTION INTERNAL FIXATION (ORIF) RIGHT DISTAL FIBULA FRACTURE  The risks benefits and alternatives were discussed with the patient including but not limited to the risks of nonoperative treatment, versus surgical intervention including infection, bleeding, nerve injury, malunion, nonunion, the need for revision surgery, hardware prominence, hardware failure, the need for hardware removal, blood clots, cardiopulmonary complications, morbidity, mortality, among others, and they were willing to proceed.    Eulas PostJoshua P Amisadai Woodford, MD Cell [phone]   05/09/2017 9:55 AM

## 2017-05-09 NOTE — Op Note (Signed)
05/09/2017  PATIENT:  Carrie Dalton    PRE-OPERATIVE DIAGNOSIS:  DISPLACED FRACTURE OF LATERAL MALLEOLUS OF FIBULA  POST-OPERATIVE DIAGNOSIS:  Same  PROCEDURE:  OPEN REDUCTION INTERNAL FIXATION (ORIF) RIGHT DISTAL FIBULA FRACTURE  3 Views right ankle plus a stress view  SURGEON:  Eulas PostJoshua P Enrico Eaddy, MD  PHYSICIAN ASSISTANT: Janace LittenBrandon Parry, OPA-C, present and scrubbed throughout the case, critical for completion in a timely fashion, and for retraction, instrumentation, and closure.  ANESTHESIA:   General  ESTIMATED BLOOD LOSS: minimal  PREOPERATIVE INDICATIONS:  Ollen GrossRhonda J Dalton is a  61 y.o. female with a diagnosis of DISPLACED FRACTURE OF LATERAL MALLEOLUS OF FIBULA who elected for surgical management to minimize the risk for malunion and nonunion and post-traumatic arthritis.    The risks benefits and alternatives were discussed with the patient preoperatively including but not limited to the risks of infection, bleeding, nerve injury, cardiopulmonary complications, the need for revision surgery, the need for hardware removal, among others, and the patient was willing to proceed.  OPERATIVE IMPLANTS: Biomet / Zimmer 1/3 tubular plate 7 hole, with a single interfragmentary lag screw.  OPERATIVE PROCEDURE: The patient was brought to the operating room and placed in the supine position. All bony prominences were padded. General anesthesia was administered. The lower extremity was prepped and draped in the usual sterile fashion. The leg was elevated and exsanguinated and the tourniquet was inflated. Time out was performed.   Incision was made over the distal fibula and the fracture was exposed and reduced anatomically with a clamp. A lag screw was placed. I then applied a 1/3 tubular plate and secured it proximally and distally non-locking screws. Bone quality was mediocre. I used c-arm to confirm satisfactory reduction and fixation.   The lag screw was slightly long, and I switched it to an 18.   Also, it had some bite, but did not feel great.    The syndesmosis was stressed using live fluoroscopy and found to be stable.   3 Views plus a stress view were taken after all implants were in demonstrating anatomic alignment with internal fixation and no widening of the syndesmosis.   The wounds were irrigated, and closed with vicryl with routine closure for the skin. The wounds were injected with local anesthetic. Sterile gauze was applied followed by a posterior splint. She was awakened and returned to the PACU in stable and satisfactory condition. There were no complications.

## 2017-05-09 NOTE — Anesthesia Procedure Notes (Signed)
Procedure Name: LMA Insertion Date/Time: 05/09/2017 10:24 AM Performed by: Ronnette HilaPayne, Kennieth Plotts D, CRNA Pre-anesthesia Checklist: Patient identified, Emergency Drugs available, Suction available and Patient being monitored Patient Re-evaluated:Patient Re-evaluated prior to induction Oxygen Delivery Method: Circle system utilized Preoxygenation: Pre-oxygenation with 100% oxygen Induction Type: IV induction Ventilation: Mask ventilation without difficulty LMA: LMA inserted LMA Size: 4.0 Number of attempts: 1 Airway Equipment and Method: Bite block Placement Confirmation: positive ETCO2 Tube secured with: Tape Dental Injury: Teeth and Oropharynx as per pre-operative assessment

## 2017-05-09 NOTE — Anesthesia Preprocedure Evaluation (Addendum)
Anesthesia Evaluation  Patient identified by MRN, date of birth, ID band Patient awake    Reviewed: Allergy & Precautions, NPO status , Patient's Chart, lab work & pertinent test results  Airway Mallampati: II  TM Distance: >3 FB Neck ROM: Full    Dental  (+) Teeth Intact, Dental Advisory Given   Pulmonary asthma , former smoker,    breath sounds clear to auscultation       Cardiovascular hypertension, Pt. on medications and Pt. on home beta blockers  Rhythm:Regular Rate:Normal     Neuro/Psych PSYCHIATRIC DISORDERS Anxiety    GI/Hepatic Neg liver ROS, GERD  Medicated,  Endo/Other  negative endocrine ROS  Renal/GU negative Renal ROS  negative genitourinary   Musculoskeletal  (+) Arthritis ,   Abdominal Normal abdominal exam  (+)   Peds negative pediatric ROS (+)  Hematology negative hematology ROS (+)   Anesthesia Other Findings   Reproductive/Obstetrics                            Lab Results  Component Value Date   WBC 7.2 02/17/2015   HGB 14.2 02/17/2015   HCT 42.5 02/17/2015   MCV 87.8 02/17/2015   PLT 291.0 02/17/2015   Lab Results  Component Value Date   CREATININE 0.77 02/17/2015   BUN 8 02/17/2015   NA 142 02/17/2015   K 4.7 02/17/2015   CL 107 02/17/2015   CO2 27 02/17/2015   No results found for: INR, PROTIME  EKG: normal sinus rhythm.   Anesthesia Physical Anesthesia Plan  ASA: II  Anesthesia Plan: General   Post-op Pain Management:  Regional for Post-op pain   Induction: Intravenous  PONV Risk Score and Plan: 4 or greater and Ondansetron, Dexamethasone, Midazolam and Scopolamine patch - Pre-op  Airway Management Planned: LMA  Additional Equipment:   Intra-op Plan:   Post-operative Plan: Extubation in OR  Informed Consent: I have reviewed the patients History and Physical, chart, labs and discussed the procedure including the risks, benefits and  alternatives for the proposed anesthesia with the patient or authorized representative who has indicated his/her understanding and acceptance.   Dental advisory given  Plan Discussed with: CRNA  Anesthesia Plan Comments:         Anesthesia Quick Evaluation

## 2017-05-09 NOTE — Progress Notes (Signed)
Assisted Dr. Hollis with right, ultrasound guided, popliteal block. Side rails up, monitors on throughout procedure. See vital signs in flow sheet. Tolerated Procedure well. 

## 2017-05-09 NOTE — Anesthesia Postprocedure Evaluation (Signed)
Anesthesia Post Note  Patient: Carrie Dalton  Procedure(s) Performed: OPEN REDUCTION INTERNAL FIXATION (ORIF) RIGHT DISTAL FIBULA FRACTURE (Right Leg Lower)     Patient location during evaluation: PACU Anesthesia Type: General and Regional Level of consciousness: awake and alert Pain management: pain level controlled Vital Signs Assessment: post-procedure vital signs reviewed and stable Respiratory status: spontaneous breathing, nonlabored ventilation, respiratory function stable and patient connected to nasal cannula oxygen Cardiovascular status: blood pressure returned to baseline and stable Postop Assessment: no apparent nausea or vomiting Anesthetic complications: no    Last Vitals:  Vitals:   05/09/17 1200 05/09/17 1230  BP: 117/69 (!) 143/78  Pulse: 78 82  Resp: (!) 22 18  Temp:  36.7 C  SpO2: 93% 95%    Last Pain:  Vitals:   05/09/17 1230  TempSrc:   PainSc: 0-No pain                 Shelton SilvasKevin D Hollis

## 2017-05-09 NOTE — Transfer of Care (Signed)
Immediate Anesthesia Transfer of Care Note  Patient: Carrie Dalton  Procedure(s) Performed: OPEN REDUCTION INTERNAL FIXATION (ORIF) RIGHT DISTAL FIBULA FRACTURE (Right Leg Lower)  Patient Location: PACU  Anesthesia Type:General  Level of Consciousness: awake, alert  and oriented  Airway & Oxygen Therapy: Patient Spontanous Breathing and Patient connected to face mask oxygen  Post-op Assessment: Report given to RN and Post -op Vital signs reviewed and stable  Post vital signs: Reviewed and stable  Last Vitals:  Vitals:   05/09/17 0945 05/09/17 0950  BP: 133/76 (!) 142/75  Pulse: 68 73  Resp: 16 19  Temp:    SpO2: 98% 100%    Last Pain:  Vitals:   05/09/17 0844  TempSrc: Oral         Complications: No apparent anesthesia complications

## 2017-05-09 NOTE — Anesthesia Procedure Notes (Signed)
Anesthesia Regional Block: Popliteal block   Pre-Anesthetic Checklist: ,, timeout performed, Correct Patient, Correct Site, Correct Laterality, Correct Procedure, Correct Position, site marked, Risks and benefits discussed,  Surgical consent,  Pre-op evaluation,  At surgeon's request and post-op pain management  Laterality: Right  Prep: chloraprep       Needles:  Injection technique: Single-shot  Needle Type: Echogenic Needle     Needle Length: 9cm  Needle Gauge: 21     Additional Needles:   Procedures:,,,, ultrasound used (permanent image in chart),,,,  Narrative:  Start time: 05/09/2017 9:40 AM End time: 05/09/2017 9:50 AM Injection made incrementally with aspirations every 5 mL.  Performed by: Personally  Anesthesiologist: Shelton SilvasHollis, Pearline Yerby D, MD  Additional Notes: Patient tolerated the procedure well. Local anesthetic introduced in an incremental fashion under minimal resistance after negative aspirations. No paresthesias were elicited. After completion of the procedure, no acute issues were identified and patient continued to be monitored by RN.

## 2017-05-09 NOTE — Discharge Instructions (Signed)
Diet: As you were doing prior to hospitalization   Shower:  May shower but keep the wounds dry, use an occlusive plastic wrap, NO SOAKING IN TUB.  If the bandage gets wet, change with a clean dry gauze.  If you have a splint on, leave the splint in place and keep the splint dry with a plastic bag.  Dressing:  Post Anesthesia Home Care Instructions  Activity: Get plenty of rest for the remainder of the day. A responsible individual must stay with you for 24 hours following the procedure.  For the next 24 hours, DO NOT: -Drive a car -Advertising copywriterperate machinery -Drink alcoholic beverages -Take any medication unless instructed by your physician -Make any legal decisions or sign important papers.  Meals: Start with liquid foods such as gelatin or soup. Progress to regular foods as tolerated. Avoid greasy, spicy, heavy foods. If nausea and/or vomiting occur, drink only clear liquids until the nausea and/or vomiting subsides. Call your physician if vomiting continues.  Special Instructions/Symptoms: Your throat may feel dry or sore from the anesthesia or the breathing tube placed in your throat during surgery. If this causes discomfort, gargle with warm salt water. The discomfort should disappear within 24 hours.  If you had a scopolamine patch placed behind your ear for the management of post- operative nausea and/or vomiting:  1. The medication in the patch is effective for 72 hours, after which it should be removed.  Wrap patch in a tissue and discard in the trash. Wash hands thoroughly with soap and water. 2. You may remove the patch earlier than 72 hours if you experience unpleasant side effects which may include dry mouth, dizziness or visual disturbances. 3. Avoid touching the patch. Wash your hands with soap and water after contact with the patch.    Post Anesthesia Home Care Instructions  Activity: Get plenty of rest for the remainder of the day. A responsible individual must stay with you  for 24 hours following the procedure.  For the next 24 hours, DO NOT: -Drive a car -Advertising copywriterperate machinery -Drink alcoholic beverages -Take any medication unless instructed by your physician -Make any legal decisions or sign important papers.  Meals: Start with liquid foods such as gelatin or soup. Progress to regular foods as tolerated. Avoid greasy, spicy, heavy foods. If nausea and/or vomiting occur, drink only clear liquids until the nausea and/or vomiting subsides. Call your physician if vomiting continues.  Special Instructions/Symptoms: Your throat may feel dry or sore from the anesthesia or the breathing tube placed in your throat during surgery. If this causes discomfort, gargle with warm salt water. The discomfort should disappear within 24 hours.  If you had a scopolamine patch placed behind your ear for the management of post- operative nausea and/or vomiting:  1. The medication in the patch is effective for 72 hours, after which it should be removed.  Wrap patch in a tissue and discard in the trash. Wash hands thoroughly with soap and water. 2. You may remove the patch earlier than 72 hours if you experience unpleasant side effects which may include dry mouth, dizziness or visual disturbances. 3. Avoid touching the patch. Wash your hands with soap and water after contact with the patch.      Regional Anesthesia Blocks  1. Numbness or the inability to move the "blocked" extremity may last from 3-48 hours after placement. The length of time depends on the medication injected and your individual response to the medication. If the numbness is not going  away after 48 hours, call your surgeon.  2. The extremity that is blocked will need to be protected until the numbness is gone and the  Strength has returned. Because you cannot feel it, you will need to take extra care to avoid injury. Because it may be weak, you may have difficulty moving it or using it. You may not know what position  it is in without looking at it while the block is in effect.  3. For blocks in the legs and feet, returning to weight bearing and walking needs to be done carefully. You will need to wait until the numbness is entirely gone and the strength has returned. You should be able to move your leg and foot normally before you try and bear weight or walk. You will need someone to be with you when you first try to ensure you do not fall and possibly risk injury.  4. Bruising and tenderness at the needle site are common side effects and will resolve in a few days.  5. Persistent numbness or new problems with movement should be communicated to the surgeon or the [facility] [phone])/ [facility] [phone]).   t.  If you have a splint, then just leave the splint in place and we will change your bandages during your first follow-up appointment.    If you had hand or foot surgery, we will plan to remove your stitches in about 2 weeks in the office.  For all other surgeries, there are sticky tapes (steri-strips) on your wounds and all the stitches are absorbable.  Leave the steri-strips in place when changing your dressings, they will peel off with time, usually 2-3 weeks.  Activity:  Increase activity slowly as tolerated, but follow the weight bearing instructions below.  The rules on driving is that you can not be taking narcotics while you drive, and you must feel in control of the vehicle.    Weight Bearing:   No weight on operative leg.    To prevent constipation: you may use a stool softener such as -  Colace (over the counter) 100 mg by mouth twice a day  Drink plenty of fluids (prune juice may be helpful) and high fiber foods Miralax (over the counter) for constipation as needed.    Itching:  If you experience itching with your medications, try taking only a single pain pill, or even half a pain pill at a time.  You may take up to 10 pain pills per day, and you can  also use benadryl over the counter for itching or also to help with sleep.   Precautions:  If you experience chest pain or shortness of breath - call 911 immediately for transfer to the hospital emergency department!!  If you develop a fever greater that 101 F, purulent drainage from wound, increased redness or drainage from wound, or calf pain -- Call the office at [phone]                                                Follow- Up Appointment:  Please call for an appointment to be seen in 2 weeks [place] [phone]

## 2017-05-11 ENCOUNTER — Encounter (HOSPITAL_BASED_OUTPATIENT_CLINIC_OR_DEPARTMENT_OTHER): Payer: Self-pay | Admitting: Orthopedic Surgery

## 2017-05-17 DIAGNOSIS — S8261XD Displaced fracture of lateral malleolus of right fibula, subsequent encounter for closed fracture with routine healing: Secondary | ICD-10-CM | POA: Diagnosis not present

## 2017-05-29 ENCOUNTER — Other Ambulatory Visit: Payer: Self-pay | Admitting: Internal Medicine

## 2017-06-06 ENCOUNTER — Ambulatory Visit: Payer: 59 | Admitting: Internal Medicine

## 2017-06-14 DIAGNOSIS — S8261XD Displaced fracture of lateral malleolus of right fibula, subsequent encounter for closed fracture with routine healing: Secondary | ICD-10-CM | POA: Diagnosis not present

## 2017-07-06 NOTE — Progress Notes (Signed)
Subjective:    Patient ID: Carrie Dalton, female    DOB: 05/14/1956, 62 y.o.   MRN: [id number]  HPI She is here for a physical exam.   She is currently on the keto for 1 month. She is eating a lot of salads.  She has lost weight.  She has not been able to exercise due to a fibular fracture two months ago after falling on ice.    Her hair is thinning - it is diffuse.  She denies any excess stress.  She wondered what the possible cause was.  She feels she eats well overall and is eating plenty vegetables.  She has never seen a dermatologist for this.  Abnormal EKG: She had an EKG recently before surgery.  She was told that it was abnormal and that at some point she should see a cardiologist.  She is currently not exercising due to her fracture.  She denies any concerning symptoms including chest pain, palpitations and shortness of breath.  Medications and allergies reviewed with patient and updated if appropriate.  Patient Active Problem List   Diagnosis Date Noted  . Closed fracture of right distal fibula 05/09/2017  . Hyperglycemia 03/29/2017  . Essential hypertension, benign 03/11/2016  . GERD (gastroesophageal reflux disease) 03/11/2016  . Generalized anxiety disorder 02/17/2015  . Obesity 02/17/2015  . Osteopenia 11/26/2009  . Asthma, mild persistent 11/05/2009  . HYPERCHOLESTEROLEMIA, BORDERLINE 08/28/2008  . UNSPECIFIED PAROXYSMAL TACHYCARDIA 08/28/2008  . POSITIVE PPD 08/28/2008  . DEGENERATIVE JOINT DISEASE 09/06/2007  . Chronic insomnia 09/06/2007  . EDEMA 08/27/2007    Current Outpatient Medications on File Prior to Visit  Medication Sig Dispense Refill  . albuterol (PROVENTIL HFA;VENTOLIN HFA) 108 (90 Base) MCG/ACT inhaler Inhale 2 puffs into the lungs every 6 (six) hours as needed for wheezing or shortness of breath. 1 Inhaler 12  . aspirin 81 MG tablet Take 81 mg by mouth daily.      . budesonide-formoterol (SYMBICORT) 80-4.5 MCG/ACT inhaler Inhale 2 puffs into  the lungs 2 (two) times daily.    . cholecalciferol (VITAMIN D) 1000 UNITS tablet Take 1,000 Units by mouth daily.      . clonazePAM (KLONOPIN) 0.5 MG tablet TAKE 1 TO 3 TABLETS BY MOUTH NIGHTLY AT BEDTIME AS NEEDED 90 tablet 5  . cyclobenzaprine (FLEXERIL) 10 MG tablet Take 1 tablet (10 mg total) 3 (three) times daily as needed by mouth. 90 tablet 0  . LORazepam (ATIVAN) 1 MG tablet TAKE 1 TABLET BY MOUTH EVERY 8 HOURS AS NEEDED FOR ANXIETY 90 tablet 5  . nebivolol (BYSTOLIC) 10 MG tablet Take 1 tablet (10 mg total) daily by mouth. 21 tablet 0  . pantoprazole (PROTONIX) 40 MG tablet Take 1 tablet (40 mg total) daily by mouth. 90 tablet 3  . triazolam (HALCION) 0.25 MG tablet TAKE 1 TABLET BY MOUTH NIGHTLY AT BEDTIME AS NEEDED 30 tablet 5   No current facility-administered medications on file prior to visit.     Past Medical History:  Diagnosis Date  . ASTHMA, UNSPECIFIED, UNSPECIFIED STATUS   . Closed fracture of right distal fibula 05/09/2017  . DEGENERATIVE JOINT DISEASE   . Family history of adverse reaction to anesthesia    ponv  . HYPERCHOLESTEROLEMIA, BORDERLINE   . NECK PAIN 11/05/2009  . OSTEOPENIA   . POSITIVE PPD   . SLEEP DISORDER, HX OF   . UNSPECIFIED PAROXYSMAL TACHYCARDIA     Past Surgical History:  Procedure Laterality Date  . CESAREAN  SECTION  1984  . DILATION AND CURETTAGE OF UTERUS     x 3  . ORIF FIBULA FRACTURE Right 05/09/2017   Procedure: OPEN REDUCTION INTERNAL FIXATION (ORIF) RIGHT DISTAL FIBULA FRACTURE;  Surgeon: Teryl Lucy, MD;  Location: [facility];  Service: Orthopedics;  Laterality: Right;    Social History   Socioeconomic History  . Marital status: Married    Spouse name: Not on file  . Number of children: 3  . Years of education: Not on file  . Highest education level: Not on file  Social Needs  . Financial resource strain: Not on file  . Food insecurity - worry: Not on file  . Food insecurity - inability: Not on file    . Transportation needs - medical: Not on file  . Transportation needs - non-medical: Not on file  Occupational History  . Occupation: PATIENT CARE COORDIN    Employer: [employer]  Tobacco Use  . Smoking status: Former Smoker    Last attempt to quit: 05/23/2000    Years since quitting: 17.1  . Smokeless tobacco: Never Used  Substance and Sexual Activity  . Alcohol use: No  . Drug use: No  . Sexual activity: Not on file  Other Topics Concern  . Not on file  Social History Narrative  . Not on file    Family History  Problem Relation Age of Onset  . Diabetes Father   . Hypertension Father   . Stroke Mother   . Atrial fibrillation Mother   . Prostate cancer Maternal Grandfather   . Atrial fibrillation Maternal Grandmother   . Heart failure Maternal Grandmother        CHF  . Hypertension Maternal Grandmother   . Heart disease Daughter        heart transplant  . Pancreatitis Daughter     Review of Systems  Constitutional: Negative for chills and fever.  Eyes: Negative for visual disturbance.  Respiratory: Negative for cough, shortness of breath and wheezing.   Cardiovascular: Positive for leg swelling (at end of day). Negative for chest pain and palpitations.  Gastrointestinal: Positive for nausea. Negative for abdominal pain, blood in stool, constipation and diarrhea.       GERD controlled  Genitourinary: Negative for dysuria and hematuria.  Musculoskeletal: Negative for arthralgias, back pain and myalgias.  Skin: Negative for color change and rash.       Thinning hair  Neurological: Negative for light-headedness and headaches.  Psychiatric/Behavioral: Negative for dysphoric mood. The patient is not nervous/anxious.        Objective:   Vitals:   07/07/17 0820  BP: 122/78  Pulse: 68  Resp: 16  Temp: 98.1 F (36.7 C)  SpO2: 98%   Filed Weights   07/07/17 0820  Weight: 183 lb (83 kg)   Body mass index is 33.47 kg/m.  Wt Readings from Last 3  Encounters:  07/07/17 183 lb (83 kg)  05/09/17 195 lb (88.5 kg)  03/29/17 203 lb (92.1 kg)     Physical Exam Constitutional: She appears well-developed and well-nourished. No distress.  HENT:  Head: Normocephalic and atraumatic.  Right Ear: External ear normal. Normal ear canal and TM Left Ear: External ear normal.  Normal ear canal and TM Mouth/Throat: Oropharynx is clear and moist.  Eyes: Conjunctivae and EOM are normal.  Neck: Neck supple. No tracheal deviation present. No thyromegaly present.  No carotid bruit  Cardiovascular: Normal rate, regular rhythm and normal heart sounds.   No murmur  heard.  No edema in LLE, RLE in boot Pulmonary/Chest: Effort normal and breath sounds normal. No respiratory distress. She has no wheezes. She has no rales.  Breast: deferred to Gyn Abdominal: Soft. She exhibits no distension. There is no tenderness.  Lymphadenopathy: She has no cervical adenopathy.  Skin: Skin is warm and dry. She is not diaphoretic.  Psychiatric: She has a normal mood and affect. Her behavior is normal.        Assessment & Plan:   Physical exam: Screening blood work  ordered Immunizations   Discussed shingrix, others up to date Colonoscopy  Never had one - will refer Mammogram   Up to date  Gyn    Up to date  Dexa  Done 2013 - osteopenia - dexa ordered Eye exams  Up to date  EKG   Last done 04/2017 Exercise - not able to exercise due to healing fibular fracture Weight - encouraged weight loss Skin    No concerns Substance abuse   none  See Problem List for Assessment and Plan of chronic medical problems.   FU in 1 year

## 2017-07-06 NOTE — Patient Instructions (Addendum)
Test(s) ordered today. Your results will be released to MyChart (or called to you) after review, usually within 72hours after test completion. If any changes need to be made, you will be notified at that same time.  All other Health Maintenance issues reviewed.   All recommended immunizations and age-appropriate screenings are up-to-date or discussed.  No immunizations administered today.   Medications reviewed and updated.  No changes recommended at this time.  An Echocardiogram was ordered.  Please followup in one year   Health Maintenance, Female Adopting a healthy lifestyle and getting preventive care can go a long way to promote health and wellness. Talk with your health care provider about what schedule of regular examinations is right for you. This is a good chance for you to check in with your provider about disease prevention and staying healthy. In between checkups, there are plenty of things you can do on your own. Experts have done a lot of research about which lifestyle changes and preventive measures are most likely to keep you healthy. Ask your health care provider for more information. Weight and diet Eat a healthy diet  Be sure to include plenty of vegetables, fruits, low-fat dairy products, and lean protein.  Do not eat a lot of foods high in solid fats, added sugars, or salt.  Get regular exercise. This is one of the most important things you can do for your health. ? Most adults should exercise for at least 150 minutes each week. The exercise should increase your heart rate and make you sweat (moderate-intensity exercise). ? Most adults should also do strengthening exercises at least twice a week. This is in addition to the moderate-intensity exercise.  Maintain a healthy weight  Body mass index (BMI) is a measurement that can be used to identify possible weight problems. It estimates body fat based on height and weight. Your health care provider can help determine  your BMI and help you achieve or maintain a healthy weight.  For females 20 years of age and older: ? A BMI below 18.5 is considered underweight. ? A BMI of 18.5 to 24.9 is normal. ? A BMI of 25 to 29.9 is considered overweight. ? A BMI of 30 and above is considered obese.  Watch levels of cholesterol and blood lipids  You should start having your blood tested for lipids and cholesterol at 63 years of age, then have this test every 5 years.  You may need to have your cholesterol levels checked more often if: ? Your lipid or cholesterol levels are high. ? You are older than 63 years of age. ? You are at high risk for heart disease.  Cancer screening Lung Cancer  Lung cancer screening is recommended for adults 55-80 years old who are at high risk for lung cancer because of a history of smoking.  A yearly low-dose CT scan of the lungs is recommended for people who: ? Currently smoke. ? Have quit within the past 15 years. ? Have at least a 30-pack-year history of smoking. A pack year is smoking an average of one pack of cigarettes a day for 1 year.  Yearly screening should continue until it has been 15 years since you quit.  Yearly screening should stop if you develop a health problem that would prevent you from having lung cancer treatment.  Breast Cancer  Practice breast self-awareness. This means understanding how your breasts normally appear and feel.  It also means doing regular breast self-exams. Let your health care provider   know about any changes, no matter how small.  If you are in your 20s or 30s, you should have a clinical breast exam (CBE) by a health care provider every 1-3 years as part of a regular health exam.  If you are 41 or older, have a CBE every year. Also consider having a breast X-ray (mammogram) every year.  If you have a family history of breast cancer, talk to your health care provider about genetic screening.  If you are at high risk for breast  cancer, talk to your health care provider about having an MRI and a mammogram every year.  Breast cancer gene (BRCA) assessment is recommended for women who have family members with BRCA-related cancers. BRCA-related cancers include: ? Breast. ? Ovarian. ? Tubal. ? Peritoneal cancers.  Results of the assessment will determine the need for genetic counseling and BRCA1 and BRCA2 testing.  Cervical Cancer Your health care provider may recommend that you be screened regularly for cancer of the pelvic organs (ovaries, uterus, and vagina). This screening involves a pelvic examination, including checking for microscopic changes to the surface of your cervix (Pap test). You may be encouraged to have this screening done every 3 years, beginning at age 59.  For women ages 70-65, health care providers may recommend pelvic exams and Pap testing every 3 years, or they may recommend the Pap and pelvic exam, combined with testing for human papilloma virus (HPV), every 5 years. Some types of HPV increase your risk of cervical cancer. Testing for HPV may also be done on women of any age with unclear Pap test results.  Other health care providers may not recommend any screening for nonpregnant women who are considered low risk for pelvic cancer and who do not have symptoms. Ask your health care provider if a screening pelvic exam is right for you.  If you have had past treatment for cervical cancer or a condition that could lead to cancer, you need Pap tests and screening for cancer for at least 20 years after your treatment. If Pap tests have been discontinued, your risk factors (such as having a new sexual partner) need to be reassessed to determine if screening should resume. Some women have medical problems that increase the chance of getting cervical cancer. In these cases, your health care provider may recommend more frequent screening and Pap tests.  Colorectal Cancer  This type of cancer can be detected  and often prevented.  Routine colorectal cancer screening usually begins at 63 years of age and continues through 63 years of age.  Your health care provider may recommend screening at an earlier age if you have risk factors for colon cancer.  Your health care provider may also recommend using home test kits to check for hidden blood in the stool.  A small camera at the end of a tube can be used to examine your colon directly (sigmoidoscopy or colonoscopy). This is done to check for the earliest forms of colorectal cancer.  Routine screening usually begins at age 41.  Direct examination of the colon should be repeated every 5-10 years through 63 years of age. However, you may need to be screened more often if early forms of precancerous polyps or small growths are found.  Skin Cancer  Check your skin from head to toe regularly.  Tell your health care provider about any new moles or changes in moles, especially if there is a change in a mole's shape or color.  Also tell your health  care provider if you have a mole that is larger than the size of a pencil eraser.  Always use sunscreen. Apply sunscreen liberally and repeatedly throughout the day.  Protect yourself by wearing long sleeves, pants, a wide-brimmed hat, and sunglasses whenever you are outside.  Heart disease, diabetes, and high blood pressure  High blood pressure causes heart disease and increases the risk of stroke. High blood pressure is more likely to develop in: ? People who have blood pressure in the high end of the normal range (130-139/85-89 mm Hg). ? People who are overweight or obese. ? People who are African American.  If you are 18-39 years of age, have your blood pressure checked every 3-5 years. If you are 40 years of age or older, have your blood pressure checked every year. You should have your blood pressure measured twice-once when you are at a hospital or clinic, and once when you are not at a hospital or  clinic. Record the average of the two measurements. To check your blood pressure when you are not at a hospital or clinic, you can use: ? An automated blood pressure machine at a pharmacy. ? A home blood pressure monitor.  If you are between 55 years and 79 years old, ask your health care provider if you should take aspirin to prevent strokes.  Have regular diabetes screenings. This involves taking a blood sample to check your fasting blood sugar level. ? If you are at a normal weight and have a low risk for diabetes, have this test once every three years after 63 years of age. ? If you are overweight and have a high risk for diabetes, consider being tested at a younger age or more often. Preventing infection Hepatitis B  If you have a higher risk for hepatitis B, you should be screened for this virus. You are considered at high risk for hepatitis B if: ? You were born in a country where hepatitis B is common. Ask your health care provider which countries are considered high risk. ? Your parents were born in a high-risk country, and you have not been immunized against hepatitis B (hepatitis B vaccine). ? You have HIV or AIDS. ? You use needles to inject street drugs. ? You live with someone who has hepatitis B. ? You have had sex with someone who has hepatitis B. ? You get hemodialysis treatment. ? You take certain medicines for conditions, including cancer, organ transplantation, and autoimmune conditions.  Hepatitis C  Blood testing is recommended for: ? Everyone born from 1945 through 1965. ? Anyone with known risk factors for hepatitis C.  Sexually transmitted infections (STIs)  You should be screened for sexually transmitted infections (STIs) including gonorrhea and chlamydia if: ? You are sexually active and are younger than 63 years of age. ? You are older than 63 years of age and your health care provider tells you that you are at risk for this type of infection. ? Your  sexual activity has changed since you were last screened and you are at an increased risk for chlamydia or gonorrhea. Ask your health care provider if you are at risk.  If you do not have HIV, but are at risk, it may be recommended that you take a prescription medicine daily to prevent HIV infection. This is called pre-exposure prophylaxis (PrEP). You are considered at risk if: ? You are sexually active and do not regularly use condoms or know the HIV status of your partner(s). ? You   take drugs by injection. ? You are sexually active with a partner who has HIV.  Talk with your health care provider about whether you are at high risk of being infected with HIV. If you choose to begin PrEP, you should first be tested for HIV. You should then be tested every 3 months for as long as you are taking PrEP. Pregnancy  If you are premenopausal and you may become pregnant, ask your health care provider about preconception counseling.  If you may become pregnant, take 400 to 800 micrograms (mcg) of folic acid every day.  If you want to prevent pregnancy, talk to your health care provider about birth control (contraception). Osteoporosis and menopause  Osteoporosis is a disease in which the bones lose minerals and strength with aging. This can result in serious bone fractures. Your risk for osteoporosis can be identified using a bone density scan.  If you are 65 years of age or older, or if you are at risk for osteoporosis and fractures, ask your health care provider if you should be screened.  Ask your health care provider whether you should take a calcium or vitamin D supplement to lower your risk for osteoporosis.  Menopause may have certain physical symptoms and risks.  Hormone replacement therapy may reduce some of these symptoms and risks. Talk to your health care provider about whether hormone replacement therapy is right for you. Follow these instructions at home:  Schedule regular health,  dental, and eye exams.  Stay current with your immunizations.  Do not use any tobacco products including cigarettes, chewing tobacco, or electronic cigarettes.  If you are pregnant, do not drink alcohol.  If you are breastfeeding, limit how much and how often you drink alcohol.  Limit alcohol intake to no more than 1 drink per day for nonpregnant women. One drink equals 12 ounces of beer, 5 ounces of wine, or 1 ounces of hard liquor.  Do not use street drugs.  Do not share needles.  Ask your health care provider for help if you need support or information about quitting drugs.  Tell your health care provider if you often feel depressed.  Tell your health care provider if you have ever been abused or do not feel safe at home. This information is not intended to replace advice given to you by your health care provider. Make sure you discuss any questions you have with your health care provider. Document Released: 11/22/2010 Document Revised: 10/15/2015 Document Reviewed: 02/10/2015 Elsevier Interactive Patient Education  2018 [employer]  

## 2017-07-07 ENCOUNTER — Ambulatory Visit (INDEPENDENT_AMBULATORY_CARE_PROVIDER_SITE_OTHER): Payer: 59 | Admitting: Internal Medicine

## 2017-07-07 ENCOUNTER — Ambulatory Visit (INDEPENDENT_AMBULATORY_CARE_PROVIDER_SITE_OTHER)
Admission: RE | Admit: 2017-07-07 | Discharge: 2017-07-07 | Disposition: A | Discharge status: Home / Self Care | Payer: 59 | Source: Ambulatory Visit | Attending: Internal Medicine | Admitting: Internal Medicine

## 2017-07-07 ENCOUNTER — Encounter: Payer: Self-pay | Admitting: Internal Medicine

## 2017-07-07 ENCOUNTER — Other Ambulatory Visit (INDEPENDENT_AMBULATORY_CARE_PROVIDER_SITE_OTHER): Payer: 59

## 2017-07-07 VITALS — BP 122/78 | HR 68 | Temp 98.1°F | Resp 16 | Ht 62.0 in | Wt 183.0 lb

## 2017-07-07 DIAGNOSIS — R9431 Abnormal electrocardiogram [ECG] [EKG]: Secondary | ICD-10-CM

## 2017-07-07 DIAGNOSIS — I10 Essential (primary) hypertension: Secondary | ICD-10-CM

## 2017-07-07 DIAGNOSIS — Z1211 Encounter for screening for malignant neoplasm of colon: Secondary | ICD-10-CM

## 2017-07-07 DIAGNOSIS — Z114 Encounter for screening for human immunodeficiency virus [HIV]: Secondary | ICD-10-CM

## 2017-07-07 DIAGNOSIS — K219 Gastro-esophageal reflux disease without esophagitis: Secondary | ICD-10-CM

## 2017-07-07 DIAGNOSIS — Z0000 Encounter for general adult medical examination without abnormal findings: Secondary | ICD-10-CM

## 2017-07-07 DIAGNOSIS — R739 Hyperglycemia, unspecified: Secondary | ICD-10-CM

## 2017-07-07 DIAGNOSIS — Z1159 Encounter for screening for other viral diseases: Secondary | ICD-10-CM

## 2017-07-07 DIAGNOSIS — M85851 Other specified disorders of bone density and structure, right thigh: Secondary | ICD-10-CM

## 2017-07-07 LAB — CBC WITH DIFFERENTIAL/PLATELET
BASOS PCT: 0.5 % (ref 0.0–3.0)
Basophils Absolute: 0 10*3/uL (ref 0.0–0.1)
EOS PCT: 5.8 % — AB (ref 0.0–5.0)
Eosinophils Absolute: 0.3 10*3/uL (ref 0.0–0.7)
HCT: 42.7 % (ref 36.0–46.0)
HEMOGLOBIN: 14.7 g/dL (ref 12.0–15.0)
Lymphocytes Relative: 25 % (ref 12.0–46.0)
Lymphs Abs: 1.4 10*3/uL (ref 0.7–4.0)
MCHC: 34.5 g/dL (ref 30.0–36.0)
MCV: 87.1 fl (ref 78.0–100.0)
Monocytes Absolute: 0.5 10*3/uL (ref 0.1–1.0)
Monocytes Relative: 8.6 % (ref 3.0–12.0)
NEUTROS ABS: 3.4 10*3/uL (ref 1.4–7.7)
Neutrophils Relative %: 60.1 % (ref 43.0–77.0)
PLATELETS: 238 10*3/uL (ref 150.0–400.0)
RBC: 4.9 Mil/uL (ref 3.87–5.11)
RDW: 13.2 % (ref 11.5–15.5)
WBC: 5.7 10*3/uL (ref 4.0–10.5)

## 2017-07-07 LAB — COMPREHENSIVE METABOLIC PANEL
ALBUMIN: 4.2 g/dL (ref 3.5–5.2)
ALT: 11 U/L (ref 0–35)
AST: 10 U/L (ref 0–37)
Alkaline Phosphatase: 71 U/L (ref 39–117)
BUN: 11 mg/dL (ref 6–23)
CHLORIDE: 106 meq/L (ref 96–112)
CO2: 28 meq/L (ref 19–32)
CREATININE: 0.75 mg/dL (ref 0.40–1.20)
Calcium: 9.5 mg/dL (ref 8.4–10.5)
GFR: 83.09 mL/min (ref >60.00)
GLUCOSE: 113 mg/dL — AB (ref 70–99)
POTASSIUM: 4.7 meq/L (ref 3.5–5.1)
SODIUM: 142 meq/L (ref 135–145)
Total Bilirubin: 0.4 mg/dL (ref 0.2–1.2)
Total Protein: 6.8 g/dL (ref 6.0–8.3)

## 2017-07-07 LAB — TSH: TSH: 1.59 u[IU]/mL (ref 0.35–4.50)

## 2017-07-07 LAB — LIPID PANEL
CHOL/HDL RATIO: 6
CHOLESTEROL: 202 mg/dL — AB (ref 0–200)
HDL: 34.2 mg/dL — ABNORMAL LOW (ref >39.00)
LDL CALC: 148 mg/dL — AB (ref 0–99)
NONHDL: 168.14
Triglycerides: 99 mg/dL (ref 0.0–149.0)
VLDL: 19.8 mg/dL (ref 0.0–40.0)

## 2017-07-07 LAB — VITAMIN D 25 HYDROXY (VIT D DEFICIENCY, FRACTURES): VITD: 8.78 ng/mL — AB (ref 30.00–100.00)

## 2017-07-07 LAB — HEMOGLOBIN A1C: HEMOGLOBIN A1C: 5.4 % (ref 4.6–6.5)

## 2017-07-07 NOTE — Assessment & Plan Note (Signed)
BP well controlled Current regimen effective and well tolerated Continue current medications at current doses cmp  

## 2017-07-07 NOTE — Assessment & Plan Note (Addendum)
DEXA due-ordered Encouraged starting regular exercise when she is able Advised taking calcium and vitamin D Check vitamin D level Has not tolerated oral bisphosphonates in the past

## 2017-07-07 NOTE — Assessment & Plan Note (Signed)
Has an abnormal EKG that has never been evaluated further.  Her last 4 EKGs have been consistent No concerning symptoms We will get an echocardiogram to further evaluate Can consider cardiology referral if needed

## 2017-07-07 NOTE — Assessment & Plan Note (Signed)
GERD controlled Continue daily medication  

## 2017-07-07 NOTE — Assessment & Plan Note (Signed)
Check a1c Low sugar / carb diet Stressed regular exercise   

## 2017-07-08 LAB — HIV ANTIBODY (ROUTINE TESTING W REFLEX): HIV 1&2 Ab, 4th Generation: NONREACTIVE

## 2017-07-08 LAB — HEPATITIS C ANTIBODY
HEP C AB: NONREACTIVE
SIGNAL TO CUT-OFF: 0.01 (ref <1.00)

## 2017-07-11 ENCOUNTER — Other Ambulatory Visit: Payer: 59

## 2017-07-11 ENCOUNTER — Other Ambulatory Visit: Payer: Self-pay | Admitting: Emergency Medicine

## 2017-07-11 MED ORDER — VITAMIN D3 1.25 MG (50000 UT) PO TABS: 50000.0000 [IU] | ORAL_TABLET | ORAL | 0 refills | Status: DC

## 2017-07-12 ENCOUNTER — Telehealth: Payer: Self-pay | Admitting: Emergency Medicine

## 2017-07-12 ENCOUNTER — Other Ambulatory Visit: Payer: Self-pay | Admitting: Internal Medicine

## 2017-07-12 DIAGNOSIS — R9431 Abnormal electrocardiogram [ECG] [EKG]: Secondary | ICD-10-CM

## 2017-07-12 DIAGNOSIS — S8261XD Displaced fracture of lateral malleolus of right fibula, subsequent encounter for closed fracture with routine healing: Secondary | ICD-10-CM | POA: Diagnosis not present

## 2017-07-12 DIAGNOSIS — I479 Paroxysmal tachycardia, unspecified: Secondary | ICD-10-CM

## 2017-07-12 NOTE — Telephone Encounter (Signed)
Copied from CRM [id number]. Topic: Quick Communication - Lab Results >> Jul 10, 2017  3:29 PM Gypsy LoreMitchell, Dyamon Sosinski B, CMA wrote: LVM for pt to call back and discuss lab results and med changes with assistant.  >> Jul 12, 2017  9:19 AM Louie BunPalacios Medina, Rosey Batheresa D wrote: Patient would like to know how much calcium she can take. Please call patient back, thanks.

## 2017-07-12 NOTE — Telephone Encounter (Signed)
LVm informing pt of MDs response.  

## 2017-07-12 NOTE — Telephone Encounter (Signed)
Goal is 1200 mg / day -- it can be in combination of food and a supplement

## 2017-07-13 ENCOUNTER — Other Ambulatory Visit: Payer: Self-pay

## 2017-07-13 ENCOUNTER — Other Ambulatory Visit: Payer: Self-pay | Admitting: Internal Medicine

## 2017-07-13 ENCOUNTER — Ambulatory Visit (INDEPENDENT_AMBULATORY_CARE_PROVIDER_SITE_OTHER): Payer: 59

## 2017-07-13 DIAGNOSIS — I479 Paroxysmal tachycardia, unspecified: Secondary | ICD-10-CM | POA: Diagnosis not present

## 2017-07-13 DIAGNOSIS — R9431 Abnormal electrocardiogram [ECG] [EKG]: Secondary | ICD-10-CM

## 2017-07-13 NOTE — Telephone Encounter (Signed)
Ok to refill total 6 months 

## 2017-07-13 NOTE — Telephone Encounter (Signed)
Rx has been called in per CY. 

## 2017-07-13 NOTE — Telephone Encounter (Signed)
CY please advise on refill for Triazlam 0.25 mg.  Current Outpatient Medications on File Prior to Visit  Medication Sig Dispense Refill  . albuterol (PROVENTIL HFA;VENTOLIN HFA) 108 (90 Base) MCG/ACT inhaler Inhale 2 puffs into the lungs every 6 (six) hours as needed for wheezing or shortness of breath. 1 Inhaler 12  . aspirin 81 MG tablet Take 81 mg by mouth daily.      . budesonide-formoterol (SYMBICORT) 80-4.5 MCG/ACT inhaler Inhale 2 puffs into the lungs 2 (two) times daily.    . cholecalciferol (VITAMIN D) 1000 UNITS tablet Take 1,000 Units by mouth daily.      . Cholecalciferol (VITAMIN D3) 50000 units TABS Take 50,000 Units by mouth once a week. 12 tablet 0  . clonazePAM (KLONOPIN) 0.5 MG tablet TAKE 1 TO 3 TABLETS BY MOUTH NIGHTLY AT BEDTIME AS NEEDED 90 tablet 5  . cyclobenzaprine (FLEXERIL) 10 MG tablet Take 1 tablet (10 mg total) 3 (three) times daily as needed by mouth. 90 tablet 0  . LORazepam (ATIVAN) 1 MG tablet TAKE 1 TABLET BY MOUTH EVERY 8 HOURS AS NEEDED FOR ANXIETY 90 tablet 5  . nebivolol (BYSTOLIC) 10 MG tablet Take 1 tablet (10 mg total) daily by mouth. 21 tablet 0  . pantoprazole (PROTONIX) 40 MG tablet Take 1 tablet (40 mg total) daily by mouth. 90 tablet 3  . triazolam (HALCION) 0.25 MG tablet TAKE 1 TABLET BY MOUTH NIGHTLY AT BEDTIME AS NEEDED 30 tablet 5   No current facility-administered medications on file prior to visit.    Allergies  Allergen Reactions  . Codeine   . Compazine   . Merthiolate [Thimerosal] Dermatitis    Itching, blisters, rash to area applied   . Nickel Dermatitis    Blistering of skin   . Prochlorperazine Edisylate

## 2017-07-17 ENCOUNTER — Other Ambulatory Visit: Payer: Self-pay | Admitting: Internal Medicine

## 2017-07-20 ENCOUNTER — Other Ambulatory Visit: Payer: 59

## 2017-08-03 DIAGNOSIS — Z1231 Encounter for screening mammogram for malignant neoplasm of breast: Secondary | ICD-10-CM | POA: Diagnosis not present

## 2017-08-03 DIAGNOSIS — Z130 Encounter for screening for diseases of the blood and blood-forming organs and certain disorders involving the immune mechanism: Secondary | ICD-10-CM | POA: Diagnosis not present

## 2017-08-03 DIAGNOSIS — Z1389 Encounter for screening for other disorder: Secondary | ICD-10-CM | POA: Diagnosis not present

## 2017-08-03 DIAGNOSIS — Z6834 Body mass index (BMI) 34.0-34.9, adult: Secondary | ICD-10-CM | POA: Diagnosis not present

## 2017-08-03 DIAGNOSIS — Z01419 Encounter for gynecological examination (general) (routine) without abnormal findings: Secondary | ICD-10-CM | POA: Diagnosis not present

## 2017-08-07 ENCOUNTER — Other Ambulatory Visit: Payer: Self-pay | Admitting: Internal Medicine

## 2017-08-08 NOTE — Telephone Encounter (Signed)
Rx was sent to pharm  Pt aware  

## 2017-08-08 NOTE — Telephone Encounter (Signed)
Ok refill 6 months 

## 2017-08-08 NOTE — Telephone Encounter (Signed)
CY Please advise on refill. Pt last seen 09-2016 and has pending OV 09-2017. Thanks.

## 2017-08-11 DIAGNOSIS — S8261XD Displaced fracture of lateral malleolus of right fibula, subsequent encounter for closed fracture with routine healing: Secondary | ICD-10-CM | POA: Diagnosis not present

## 2017-08-29 ENCOUNTER — Ambulatory Visit: Payer: 59 | Attending: Orthopedic Surgery

## 2017-08-29 ENCOUNTER — Other Ambulatory Visit: Payer: Self-pay

## 2017-08-29 DIAGNOSIS — M25671 Stiffness of right ankle, not elsewhere classified: Secondary | ICD-10-CM | POA: Diagnosis not present

## 2017-08-29 DIAGNOSIS — R2689 Other abnormalities of gait and mobility: Secondary | ICD-10-CM | POA: Diagnosis not present

## 2017-08-29 NOTE — Therapy (Signed)
[facility] MAIN [facility] SERVICES [address] [place], [place], [address] Phone: [phone]   Fax:  [phone]  Physical Therapy Evaluation  Patient Details  Name: Carrie Dalton MRN: [id number] Date of Birth: 1956/03/10 Referring Provider: Teryl Lucy MD   Encounter Date: 08/29/2017  PT End of Session - 08/29/17 1718    Visit Number  1    Number of Visits  12    Date for PT Re-Evaluation  10/10/17    PT Start Time  1506    PT Stop Time  1601    PT Time Calculation (min)  55 min    Activity Tolerance  Patient tolerated treatment well    Behavior During Therapy  [facility] for tasks assessed/performed       Past Medical History:  Diagnosis Date  . ASTHMA, UNSPECIFIED, UNSPECIFIED STATUS   . Closed fracture of right distal fibula 05/09/2017  . DEGENERATIVE JOINT DISEASE   . Family history of adverse reaction to anesthesia    ponv  . HYPERCHOLESTEROLEMIA, BORDERLINE   . NECK PAIN 11/05/2009  . OSTEOPENIA   . POSITIVE PPD   . SLEEP DISORDER, HX OF   . UNSPECIFIED PAROXYSMAL TACHYCARDIA     Past Surgical History:  Procedure Laterality Date  . CESAREAN SECTION  1984  . DILATION AND CURETTAGE OF UTERUS     x 3  . ORIF FIBULA FRACTURE Right 05/09/2017   Procedure: OPEN REDUCTION INTERNAL FIXATION (ORIF) RIGHT DISTAL FIBULA FRACTURE;  Surgeon: Teryl Lucy, MD;  Location: [facility];  Service: Orthopedics;  Laterality: Right;    There were no vitals filed for this visit.  May 03, 2017, pain rated 9/10, unable to bear weight.  She has had multiple previous ankle sprains, but nothing this severe.  She originally slipped on the ice which caused the fracture.  OPEN REDUCTION INTERNAL FIXATION (ORIF) RIGHT DISTAL FIBULA FRACTURE  Surgery 05/09/17 Subjective Assessment - 08/29/17 1516    Subjective  Patient is a pleasant 62 year old female who presents s/p OIF distal R fibula.     Pertinent History  Patient is a pleasant  62 year old female who presents s/p ORIF 05/09/17 to R distal fibula fracture after fall on ice 05/03/17. Patient was in boot until approximately 3-4 weeks ago per patient report and now wears brace. Is challenged by longer duration ambulation. Patient has a high pain tolerance per patient report.   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 Prior to this injury patient was ambulating, remodeling house, and playing with grand children. PMH includes osteopenia, sleep disorder, and asthma.     Limitations  Standing;Walking;House hold activities;Other (comment)    How long can you stand comfortably?  5 minutes    How long can you walk comfortably?  300 ft    Patient Stated Goals  walk without a limp     Currently in Pain?  No/denies      Has to walk at a slower pace, hurts to slow  [facility] PT Assessment - 08/29/17 0001      Assessment   Medical Diagnosis  ORIF Right distal fibula fracture    Referring Provider  Teryl Lucy MD    Onset Date/Surgical Date  05/09/17    Hand Dominance  Right    Next MD Visit  09/06/17    Prior Therapy  not for ankle      Precautions   Precautions  None    Required Braces  or Orthoses  Other Brace/Splint    Other Brace/Splint  R ankle brace      Restrictions   Weight Bearing Restrictions  No      Balance Screen   Has the patient fallen in the past 6 months  Yes    How many times?  1    Has the patient had a decrease in activity level because of a fear of falling?   Yes    Is the patient reluctant to leave their home because of a fear of falling?   Yes      Home Environment   Living Environment  Private residence    Living Arrangements  Spouse/significant other    Type of Home  House    Home Access  Stairs to enter    Entrance Stairs-Number of Steps  4    Entrance Stairs-Rails  None    Home Layout  Two level      Prior Function   Level of Independence  Independent    Vocation  Full time employment    Leisure  baby sit grandson,  arts and rafts, working on the house/remodeling       Cognition   Overall Cognitive Status  Within Functional Limits for tasks assessed      Observation/Other Assessments   Observations  decreased weight shift/acceptance onto RLE    Skin Integrity  surgical scar healed well with smooth muscle underneath residual scar tissue    Other Surveys   Other Surveys    Lower Extremity Functional Scale   34/80      Observation/Other Assessments-Edema    Edema  Circumferential;Figure 8      Circumferential Edema   Circumferential - Right  23cm      Figure 8 Edema   Figure 8 - Right   48 cm      Sensation   Light Touch  Appears Intact      Coordination   Gross Motor Movements are Fluid and Coordinated  Yes      Functional Tests   Functional tests  Single leg stance      Single Leg Stance   Comments  R 6 seconds L 15 seconds      Posture/Postural Control   Posture/Postural Control  Postural limitations    Postural Limitations  Weight shift left      ROM / Strength   AROM / PROM / Strength  AROM;Strength      AROM   Overall AROM   Deficits    AROM Assessment Site  Ankle    Right/Left Ankle  Right;Left    Right Ankle Dorsiflexion  5    Right Ankle Plantar Flexion  19    Right Ankle Inversion  21    Right Ankle Eversion  18    Left Ankle Dorsiflexion  14    Left Ankle Plantar Flexion  48    Left Ankle Inversion  36    Left Ankle Eversion  25      Strength   Overall Strength  Deficits    Strength Assessment Site  Ankle;Knee;Hip    Right/Left Hip  Right;Left    Right Hip Flexion  4/5    Right Hip Extension  4-/5    Right Hip ABduction  4/5    Left Hip Flexion  4/5    Left Hip Extension  3+/5    Left Hip ABduction  4/5    Left Hip ADduction  4/5    Right/Left Knee  Right;Left    Right Knee Flexion  4/5    Right Knee Extension  4/5    Left Knee Flexion  4/5    Left Knee Extension  4/5    Right/Left Ankle  Right;Left    Right Ankle Dorsiflexion  2+/5    Right Ankle  Plantar Flexion  2+/5    Right Ankle Inversion  2+/5    Right Ankle Eversion  2+/5    Left Ankle Dorsiflexion  4/5    Left Ankle Plantar Flexion  4/5    Left Ankle Inversion  4/5    Left Ankle Eversion  4/5      Flexibility   Soft Tissue Assessment /Muscle Length  yes limited achillles tissue length R, limited plantar fascia R       Transfers   Transfers  Sit to Stand;Stand to Sit    Sit to Stand  6: Modified independent (Device/Increase time)    Five time sit to stand comments   11 seconds decreased weight shift to RLE    Stand to Sit  6: Modified independent (Device/Increase time)      Ambulation/Gait   Ambulation/Gait  Yes    Ambulation/Gait Assistance  6: Modified independent (Device/Increase time)    Ambulation Distance (Feet)  30 Feet    Assistive device  None    Gait Pattern  Decreased stance time - right;Decreased dorsiflexion - right;Decreased weight shift to right;Right foot flat;Poor foot clearance - right    Ambulation Surface  Level;Indoor       PAIN: Current pain: 0/10 Worst pain: 3/10  POSTURE:   PROM/AROM:   STRENGTH:  Graded on a 0-5 scale Muscle Group Left Right  Shoulder flex    Shoulder Abd    Shoulder Ext    Shoulder IR/ER    Elbow    Wrist/hand    Hip Flex    Hip Abd    Hip Add    Hip Ext    Hip IR/ER    Knee Flex    Knee Ext    Ankle DF    Ankle PF     SENSATION:   SPECIAL TESTS:   FUNCTIONAL MOBILITY:   BALANCE: SLS L >15 seconds SLS R  6 seconds  GAIT:  OUTCOME MEASURES: TEST Outcome Interpretation  5 times sit<>stand 11 sec; decreased weight shift to RLE >50 yo, >15 sec indicates increased risk for falls  10 meter walk test  .91              m/s <1.0 m/s indicates increased risk for falls; limited community ambulator  Timed up and Go                 sec <14 sec indicates increased risk for falls  6 minute walk test                Feet 1000 feet is community Financial controller  <36/56 (100% risk for  falls), 37-45 (80% risk for falls); 46-51 (>50% risk for falls); 52-55 (lower risk <25% of falls)  9 Hole Peg Test L:                R:      This patient presents with 1- 2, personal factors/ comorbidities and 1-2,  body elements including body structures and functions, activity limitations and or participation restrictions. Patient's condition is stable.  Treat:   R Ankle ABCs R Ankle PF with RTB R Ankle DF with RTB R  Ankle eversion with RTB R Ankle inversion with RTB       Objective measurements completed on examination: See above findings.              PT Education - 08/29/17 1718    Education provided  Yes    Education Details  HEP, POC,     Person(s) Educated  Patient    Methods  Explanation;Demonstration;Verbal cues;Handout    Comprehension  Verbalized understanding;Returned demonstration       PT Short Term Goals - 08/29/17 1724      PT SHORT TERM GOAL #1   Title  Patient will be independent in home exercise program to improve strength/mobility for better functional independence with ADLs.    Baseline  HEP given    Time  2    Period  Weeks    Status  New    Target Date  09/12/17      PT SHORT TERM GOAL #2   Title  Patient will tolerate 8 seconds of single leg stance without loss of balance to improve ability to get in and out of shower safely.    Baseline  4/9: 7 seconds    Time  2    Period  Weeks    Status  New    Target Date  09/12/17      PT SHORT TERM GOAL #3   Title  Patient will increase BLE gross strength to 4-/5 as to improve functional strength for independent gait, increased standing tolerance and increased ADL ability.    Baseline  2+/5 due to limited mobility     Time  2    Period  Weeks    Status  New    Target Date  09/12/17        PT Long Term Goals - 08/29/17 1726      PT LONG TERM GOAL #1   Title  Patient will ambulate with heel strike and weight shift over RLE for 10 minutes demonstrating return to previous level of  function.     Baseline  unable to heel strike or weight shift     Time  6    Period  Weeks    Status  New    Target Date  10/10/17      PT LONG TERM GOAL #2   Title  Patient will increase lower extremity functional scale to >60/80 to demonstrate improved functional mobility and increased tolerance with ADLs.     Baseline  4/9: 34/80    Time  6    Period  Weeks    Status  New    Target Date  10/10/17      PT LONG TERM GOAL #3   Title  Patient will increase BLE gross strength to 4+/5 as to improve functional strength for independent gait, increased standing tolerance and increased ADL ability.    Baseline  4/9: 2+/5 due to limited mobility     Time  6    Period  Weeks    Status  New    Target Date  10/10/17      PT LONG TERM GOAL #4   Title  Patient will tolerate 15 seconds of single leg stance without loss of balance to improve ability to perform dynamic mobility and iADLs.     Baseline  4/9: R 6 seconds L 15 seconds    Time  6    Period  Weeks    Status  New    Target Date  10/10/17      PT LONG TERM GOAL #5   Title  Patient will improve R ankle AROM to [facility] to allow for heel strike and toe off during ambulation, squatting, and iADLs.     Baseline  R ankle AROM limited in all motions at this time.     Time  6    Period  Weeks    Status  New    Target Date  10/10/17             Plan - 08/29/17 1719    Clinical Impression Statement   Patient is a pleasant 62 year old female who presents s/p ORIF of R distal fibula on 05/09/17. Patient has limited R  Ankle/foot AROM and strength resulting in gait deviations including decreased weight acceptance, foot flat and missing toe off. Decreased achilles muscle tissue length noted on R with additional decreased plantar fascia tissue length. LEFS= 34/80. Patient would benefit from skilled physical therapy to improve ankle ROM, strength, and improve gait mechanics for return to previous level of function.     History and Personal  Factors relevant to plan of care:  This patient presents with 1- 2, personal factors/ comorbidities and 1-2,  body elements including body structures and functions, activity limitations and or participation restrictions. Patient's condition is stable.    Clinical Presentation  Stable    Clinical Presentation due to:  pain is minimal varying from 0-3, ROM limited with weakness present.     Clinical Decision Making  Low    Rehab Potential  Fair    Clinical Impairments Affecting Rehab Potential  (+) no history of previous ankle problems, physically fit (-) osteopenia     PT Frequency  2x / week    PT Duration  6 weeks    PT Treatment/Interventions  ADLs/Self Care Home Management;Traction;Aquatic Therapy;Cryotherapy;Electrical Stimulation;Iontophoresis 4mg /ml Dexamethasone;Moist Heat;Gait training;Ultrasound;Parrafin;Stair training;Functional mobility training;Therapeutic activities;Therapeutic exercise;Patient/family education;Neuromuscular re-education;Balance training;Manual techniques;Passive range of motion;Dry needling;Energy conservation;Taping    PT Next Visit Plan  ankle PROM, distraction, stretch, single limb stance, strength     PT Home Exercise Plan  see sheet    Consulted and Agree with Plan of Care  Patient       Patient will benefit from skilled therapeutic intervention in order to improve the following deficits and impairments:  Abnormal gait, Decreased activity tolerance, Decreased balance, Decreased endurance, Decreased range of motion, Decreased mobility, Decreased strength, Difficulty walking, Hypomobility, Increased edema, Impaired flexibility, Impaired perceived functional ability, Improper body mechanics, Pain  Visit Diagnosis: Stiffness of right ankle, not elsewhere classified  Other abnormalities of gait and mobility     Problem List Patient Active Problem List   Diagnosis Date Noted  . Abnormal EKG 07/07/2017  . Closed fracture of right distal fibula 05/09/2017  .  Hyperglycemia 03/29/2017  . Essential hypertension, benign 03/11/2016  . GERD (gastroesophageal reflux disease) 03/11/2016  . Generalized anxiety disorder 02/17/2015  . Obesity 02/17/2015  . Osteopenia 11/26/2009  . Asthma, mild persistent 11/05/2009  . HYPERCHOLESTEROLEMIA, BORDERLINE 08/28/2008  . UNSPECIFIED PAROXYSMAL TACHYCARDIA 08/28/2008  . POSITIVE PPD 08/28/2008  . DEGENERATIVE JOINT DISEASE 09/06/2007  . Chronic insomnia 09/06/2007  . EDEMA 08/27/2007   Precious Bard, PT, DPT   Precious Bard 08/29/2017, 5:32 PM  [facility] MAIN [facility] SERVICES [address] [place], [place], [address] Phone: [phone]   Fax:  [phone]  Name: DERYN MASSENGALE MRN: [id number] Date of Birth: [birth year]

## 2017-08-31 ENCOUNTER — Ambulatory Visit: Payer: 59

## 2017-09-06 DIAGNOSIS — S8261XD Displaced fracture of lateral malleolus of right fibula, subsequent encounter for closed fracture with routine healing: Secondary | ICD-10-CM | POA: Diagnosis not present

## 2017-09-07 ENCOUNTER — Ambulatory Visit: Payer: 59

## 2017-09-12 ENCOUNTER — Telehealth: Payer: Self-pay | Admitting: Internal Medicine

## 2017-09-12 NOTE — Telephone Encounter (Signed)
Rec'd from [facility] forwarded 2 pages to Dr. Stacy Burns °

## 2017-09-15 ENCOUNTER — Encounter: Payer: Self-pay | Admitting: Internal Medicine

## 2017-10-06 ENCOUNTER — Ambulatory Visit: Payer: 59 | Admitting: Internal Medicine

## 2017-11-09 ENCOUNTER — Telehealth: Payer: Self-pay | Admitting: Internal Medicine

## 2017-11-09 MED ORDER — BUDESONIDE-FORMOTEROL FUMARATE 80-4.5 MCG/ACT IN AERO: 2.0000 | INHALATION_SPRAY | Freq: Two times a day (BID) | RESPIRATORY_TRACT | 12 refills | Status: DC

## 2017-11-09 NOTE — Telephone Encounter (Signed)
Rx has been sent in. Pt is aware. Nothing further was needed. 

## 2017-11-09 NOTE — Telephone Encounter (Signed)
Thanks for looking back. Ok to Rx Symbicort 80    # 1,  Inhale 2 puffs then rinse mouth, twice daily ref x 12

## 2017-11-09 NOTE — Telephone Encounter (Signed)
Called and spoke with patient, she states that she is wanting a prescription of symbicort 80 sent to her pharmacy. When looking back in chart I see that the patient called in July of 2018 and said that [facility] it did not work for her, she was still having symptoms. Patient was then changed to Symbicort 160. Patient called in August of 2018 and stated that the symbicort 160 was not working for her and she was still coughing and had SOB. Patient was then changed to Anoro. Patient does have the symbicort 80 on her current medication list.   Patient states that she has never been on symbicort or anoro. Patient is requesting symbicort 80.  I do not see any other documentation otherwise.   CY please advise, thank you.   Current Outpatient Medications on File Prior to Visit  Medication Sig Dispense Refill  . aspirin 81 MG tablet Take 81 mg by mouth daily.      . budesonide-formoterol (SYMBICORT) 80-4.5 MCG/ACT inhaler Inhale 2 puffs into the lungs 2 (two) times daily.    . cholecalciferol (VITAMIN D) 1000 UNITS tablet Take 1,000 Units by mouth daily.      . Cholecalciferol (VITAMIN D3) 50000 units TABS Take 50,000 Units by mouth once a week. 12 tablet 0  . clonazePAM (KLONOPIN) 0.5 MG tablet TAKE 1 TO 3 TABLETS BY MOUTH NIGHTLY AT BEDTIME AS NEEDED 90 tablet 5  . cyclobenzaprine (FLEXERIL) 10 MG tablet Take 1 tablet (10 mg total) 3 (three) times daily as needed by mouth. 90 tablet 0  . LORazepam (ATIVAN) 1 MG tablet TAKE 1 TABLET BY MOUTH EVERY 8 HOURS AS NEEDED FOR ANXIETY 90 tablet 5  . nebivolol (BYSTOLIC) 10 MG tablet Take 1 tablet (10 mg total) daily by mouth. 21 tablet 0  . pantoprazole (PROTONIX) 40 MG tablet Take 1 tablet (40 mg total) daily by mouth. 90 tablet 3  . triazolam (HALCION) 0.25 MG tablet TAKE 1 TABLET BY MOUTH NIGHTLY AT BEDTIME AS NEEDED 30 tablet 5  . VENTOLIN HFA 108 (90 Base) MCG/ACT inhaler INHALE 2 PUFFS INTO THE LUNGS EVERY 6 HOURS AS NEEDED  FOR WHEEZING OR SHORTNESS OF BREATH. 18 g 12   No current facility-administered medications on file prior to visit.    Allergies  Allergen Reactions  . Codeine   . Compazine   . Merthiolate [Thimerosal] Dermatitis    Itching, blisters, rash to area applied   . Nickel Dermatitis    Blistering of skin   . Prochlorperazine Edisylate

## 2017-12-04 ENCOUNTER — Other Ambulatory Visit: Payer: Self-pay | Admitting: Internal Medicine

## 2017-12-04 NOTE — Telephone Encounter (Signed)
CY Please advise on refill.   Pt last seen 10-05-16 and no pending OV's. PCP is Eileen StanfordStacey Burns.   Thanks.

## 2017-12-04 NOTE — Telephone Encounter (Signed)
Ok refill total 6 months 

## 2017-12-08 ENCOUNTER — Other Ambulatory Visit: Payer: Self-pay | Admitting: Internal Medicine

## 2017-12-08 DIAGNOSIS — T1490XA Injury, unspecified, initial encounter: Secondary | ICD-10-CM

## 2017-12-08 NOTE — Progress Notes (Signed)
S/p Trauma RT sided chest wall  RIB Xrays ordered

## 2018-01-10 ENCOUNTER — Other Ambulatory Visit: Payer: Self-pay | Admitting: Internal Medicine

## 2018-01-10 NOTE — Telephone Encounter (Signed)
CY Please advise on refill. Pt seen 09-2016 and no pending OV's. Thanks .

## 2018-01-10 NOTE — Telephone Encounter (Signed)
Ok to refill total 6 months 

## 2018-02-05 ENCOUNTER — Other Ambulatory Visit: Payer: Self-pay | Admitting: Internal Medicine

## 2018-02-05 NOTE — Telephone Encounter (Signed)
Ok refill total 6 months 

## 2018-02-05 NOTE — Telephone Encounter (Signed)
Please advise on refill. Thanks. 

## 2018-02-07 ENCOUNTER — Encounter: Payer: Self-pay | Admitting: *Deleted

## 2018-02-07 NOTE — Progress Notes (Signed)
Medication Samples have been provided to the patient.  Drug name: Bystolic       Strength: 10 mg        Qty: 4 boxes  LOT: [id number]  Exp.Date: 06/21  Discount card also provided to patient.

## 2018-03-28 ENCOUNTER — Other Ambulatory Visit: Payer: Self-pay | Admitting: Internal Medicine

## 2018-05-04 DIAGNOSIS — L309 Dermatitis, unspecified: Secondary | ICD-10-CM | POA: Diagnosis not present

## 2018-05-04 DIAGNOSIS — Z23 Encounter for immunization: Secondary | ICD-10-CM | POA: Diagnosis not present

## 2018-05-04 DIAGNOSIS — L821 Other seborrheic keratosis: Secondary | ICD-10-CM | POA: Diagnosis not present

## 2018-05-04 DIAGNOSIS — L650 Telogen effluvium: Secondary | ICD-10-CM | POA: Diagnosis not present

## 2018-05-04 DIAGNOSIS — L658 Other specified nonscarring hair loss: Secondary | ICD-10-CM | POA: Diagnosis not present

## 2018-05-04 DIAGNOSIS — Z808 Family history of malignant neoplasm of other organs or systems: Secondary | ICD-10-CM | POA: Diagnosis not present

## 2018-06-05 ENCOUNTER — Other Ambulatory Visit: Payer: Self-pay | Admitting: Internal Medicine

## 2018-07-10 ENCOUNTER — Encounter: Payer: 59 | Admitting: Internal Medicine

## 2018-07-11 ENCOUNTER — Other Ambulatory Visit: Payer: Self-pay | Admitting: Internal Medicine

## 2018-07-11 NOTE — Telephone Encounter (Signed)
Triazolam refill e-sent

## 2018-07-11 NOTE — Telephone Encounter (Signed)
CY Please advise on refill. Thanks.  

## 2018-07-17 ENCOUNTER — Other Ambulatory Visit: Payer: Self-pay | Admitting: Internal Medicine

## 2018-07-17 NOTE — Telephone Encounter (Signed)
Lorazepam refill e-sent 

## 2018-07-17 NOTE — Telephone Encounter (Signed)
Dr. Maple Hudson, please advise if it is okay to refill med for pt. Pt last seen at office 10/05/2016 and med last filled for pt 12/04/17 #90 with 5 RF.  Pt's preferred pharmacy that this needs to be sent to is [facility] Thanks!

## 2018-07-18 DIAGNOSIS — E559 Vitamin D deficiency, unspecified: Secondary | ICD-10-CM | POA: Insufficient documentation

## 2018-07-18 NOTE — Patient Instructions (Addendum)
Tests ordered today. Your results will be released to MyChart (or called to you) after review, usually within 72hours after test completion. If any changes need to be made, you will be notified at that same time.  All other Health Maintenance issues reviewed.   All recommended immunizations and age-appropriate screenings are up-to-date or discussed.  No immunizations administered today.   Medications reviewed and updated.  Changes include :   none  Your prescription(s) have been submitted to your pharmacy. Please take as directed and contact our office if you believe you are having problem(s) with the medication(s).   Please followup in one year   Health Maintenance, Female Adopting a healthy lifestyle and getting preventive care can go a long way to promote health and wellness. Talk with your health care provider about what schedule of regular examinations is right for you. This is a good chance for you to check in with your provider about disease prevention and staying healthy. In between checkups, there are plenty of things you can do on your own. Experts have done a lot of research about which lifestyle changes and preventive measures are most likely to keep you healthy. Ask your health care provider for more information. Weight and diet Eat a healthy diet  Be sure to include plenty of vegetables, fruits, low-fat dairy products, and lean protein.  Do not eat a lot of foods high in solid fats, added sugars, or salt.  Get regular exercise. This is one of the most important things you can do for your health. ? Most adults should exercise for at least 150 minutes each week. The exercise should increase your heart rate and make you sweat (moderate-intensity exercise). ? Most adults should also do strengthening exercises at least twice a week. This is in addition to the moderate-intensity exercise. Maintain a healthy weight  Body mass index (BMI) is a measurement that can be used to  identify possible weight problems. It estimates body fat based on height and weight. Your health care provider can help determine your BMI and help you achieve or maintain a healthy weight.  For females 20 years of age and older: ? A BMI below 18.5 is considered underweight. ? A BMI of 18.5 to 24.9 is normal. ? A BMI of 25 to 29.9 is considered overweight. ? A BMI of 30 and above is considered obese. Watch levels of cholesterol and blood lipids  You should start having your blood tested for lipids and cholesterol at 64 years of age, then have this test every 5 years.  You may need to have your cholesterol levels checked more often if: ? Your lipid or cholesterol levels are high. ? You are older than 64 years of age. ? You are at high risk for heart disease. Cancer screening Lung Cancer  Lung cancer screening is recommended for adults 55-80 years old who are at high risk for lung cancer because of a history of smoking.  A yearly low-dose CT scan of the lungs is recommended for people who: ? Currently smoke. ? Have quit within the past 15 years. ? Have at least a 30-pack-year history of smoking. A pack year is smoking an average of one pack of cigarettes a day for 1 year.  Yearly screening should continue until it has been 15 years since you quit.  Yearly screening should stop if you develop a health problem that would prevent you from having lung cancer treatment. Breast Cancer  Practice breast self-awareness. This means understanding how   your breasts normally appear and feel.  It also means doing regular breast self-exams. Let your health care provider know about any changes, no matter how small.  If you are in your 20s or 30s, you should have a clinical breast exam (CBE) by a health care provider every 1-3 years as part of a regular health exam.  If you are 40 or older, have a CBE every year. Also consider having a breast X-ray (mammogram) every year.  If you have a family  history of breast cancer, talk to your health care provider about genetic screening.  If you are at high risk for breast cancer, talk to your health care provider about having an MRI and a mammogram every year.  Breast cancer gene (BRCA) assessment is recommended for women who have family members with BRCA-related cancers. BRCA-related cancers include: ? Breast. ? Ovarian. ? Tubal. ? Peritoneal cancers.  Results of the assessment will determine the need for genetic counseling and BRCA1 and BRCA2 testing. Cervical Cancer Your health care provider may recommend that you be screened regularly for cancer of the pelvic organs (ovaries, uterus, and vagina). This screening involves a pelvic examination, including checking for microscopic changes to the surface of your cervix (Pap test). You may be encouraged to have this screening done every 3 years, beginning at age 21.  For women ages 30-65, health care providers may recommend pelvic exams and Pap testing every 3 years, or they may recommend the Pap and pelvic exam, combined with testing for human papilloma virus (HPV), every 5 years. Some types of HPV increase your risk of cervical cancer. Testing for HPV may also be done on women of any age with unclear Pap test results.  Other health care providers may not recommend any screening for nonpregnant women who are considered low risk for pelvic cancer and who do not have symptoms. Ask your health care provider if a screening pelvic exam is right for you.  If you have had past treatment for cervical cancer or a condition that could lead to cancer, you need Pap tests and screening for cancer for at least 20 years after your treatment. If Pap tests have been discontinued, your risk factors (such as having a new sexual partner) need to be reassessed to determine if screening should resume. Some women have medical problems that increase the chance of getting cervical cancer. In these cases, your health care  provider may recommend more frequent screening and Pap tests. Colorectal Cancer  This type of cancer can be detected and often prevented.  Routine colorectal cancer screening usually begins at 64 years of age and continues through 64 years of age.  Your health care provider may recommend screening at an earlier age if you have risk factors for colon cancer.  Your health care provider may also recommend using home test kits to check for hidden blood in the stool.  A small camera at the end of a tube can be used to examine your colon directly (sigmoidoscopy or colonoscopy). This is done to check for the earliest forms of colorectal cancer.  Routine screening usually begins at age 50.  Direct examination of the colon should be repeated every 5-10 years through 64 years of age. However, you may need to be screened more often if early forms of precancerous polyps or small growths are found. Skin Cancer  Check your skin from head to toe regularly.  Tell your health care provider about any new moles or changes in moles, especially   if there is a change in a mole's shape or color.  Also tell your health care provider if you have a mole that is larger than the size of a pencil eraser.  Always use sunscreen. Apply sunscreen liberally and repeatedly throughout the day.  Protect yourself by wearing long sleeves, pants, a wide-brimmed hat, and sunglasses whenever you are outside. Heart disease, diabetes, and high blood pressure  High blood pressure causes heart disease and increases the risk of stroke. High blood pressure is more likely to develop in: ? People who have blood pressure in the high end of the normal range (130-139/85-89 mm Hg). ? People who are overweight or obese. ? People who are African American.  If you are 18-39 years of age, have your blood pressure checked every 3-5 years. If you are 40 years of age or older, have your blood pressure checked every year. You should have your  blood pressure measured twice-once when you are at a hospital or clinic, and once when you are not at a hospital or clinic. Record the average of the two measurements. To check your blood pressure when you are not at a hospital or clinic, you can use: ? An automated blood pressure machine at a pharmacy. ? A home blood pressure monitor.  If you are between 55 years and 79 years old, ask your health care provider if you should take aspirin to prevent strokes.  Have regular diabetes screenings. This involves taking a blood sample to check your fasting blood sugar level. ? If you are at a normal weight and have a low risk for diabetes, have this test once every three years after 64 years of age. ? If you are overweight and have a high risk for diabetes, consider being tested at a younger age or more often. Preventing infection Hepatitis B  If you have a higher risk for hepatitis B, you should be screened for this virus. You are considered at high risk for hepatitis B if: ? You were born in a country where hepatitis B is common. Ask your health care provider which countries are considered high risk. ? Your parents were born in a high-risk country, and you have not been immunized against hepatitis B (hepatitis B vaccine). ? You have HIV or AIDS. ? You use needles to inject street drugs. ? You live with someone who has hepatitis B. ? You have had sex with someone who has hepatitis B. ? You get hemodialysis treatment. ? You take certain medicines for conditions, including cancer, organ transplantation, and autoimmune conditions. Hepatitis C  Blood testing is recommended for: ? Everyone born from 1945 through 1965. ? Anyone with known risk factors for hepatitis C. Sexually transmitted infections (STIs)  You should be screened for sexually transmitted infections (STIs) including gonorrhea and chlamydia if: ? You are sexually active and are younger than 64 years of age. ? You are older than 64  years of age and your health care provider tells you that you are at risk for this type of infection. ? Your sexual activity has changed since you were last screened and you are at an increased risk for chlamydia or gonorrhea. Ask your health care provider if you are at risk.  If you do not have HIV, but are at risk, it may be recommended that you take a prescription medicine daily to prevent HIV infection. This is called pre-exposure prophylaxis (PrEP). You are considered at risk if: ? You are sexually active and do not   regularly use condoms or know the HIV status of your partner(s). ? You take drugs by injection. ? You are sexually active with a partner who has HIV. Talk with your health care provider about whether you are at high risk of being infected with HIV. If you choose to begin PrEP, you should first be tested for HIV. You should then be tested every 3 months for as long as you are taking PrEP. Pregnancy  If you are premenopausal and you may become pregnant, ask your health care provider about preconception counseling.  If you may become pregnant, take 400 to 800 micrograms (mcg) of folic acid every day.  If you want to prevent pregnancy, talk to your health care provider about birth control (contraception). Osteoporosis and menopause  Osteoporosis is a disease in which the bones lose minerals and strength with aging. This can result in serious bone fractures. Your risk for osteoporosis can be identified using a bone density scan.  If you are 65 years of age or older, or if you are at risk for osteoporosis and fractures, ask your health care provider if you should be screened.  Ask your health care provider whether you should take a calcium or vitamin D supplement to lower your risk for osteoporosis.  Menopause may have certain physical symptoms and risks.  Hormone replacement therapy may reduce some of these symptoms and risks. Talk to your health care provider about whether  hormone replacement therapy is right for you. Follow these instructions at home:  Schedule regular health, dental, and eye exams.  Stay current with your immunizations.  Do not use any tobacco products including cigarettes, chewing tobacco, or electronic cigarettes.  If you are pregnant, do not drink alcohol.  If you are breastfeeding, limit how much and how often you drink alcohol.  Limit alcohol intake to no more than 1 drink per day for nonpregnant women. One drink equals 12 ounces of beer, 5 ounces of wine, or 1 ounces of hard liquor.  Do not use street drugs.  Do not share needles.  Ask your health care provider for help if you need support or information about quitting drugs.  Tell your health care provider if you often feel depressed.  Tell your health care provider if you have ever been abused or do not feel safe at home. This information is not intended to replace advice given to you by your health care provider. Make sure you discuss any questions you have with your health care provider. Document Released: 11/22/2010 Document Revised: 10/15/2015 Document Reviewed: 02/10/2015 Elsevier Interactive Patient Education  2019 [employer]vier Inc.  

## 2018-07-18 NOTE — Progress Notes (Signed)
Subjective:    Patient ID: Carrie Dalton, female    DOB: 05-10-1956, 63 y.o.   MRN: [id number]  HPI She is here for a physical exam.   Cough:  She has had a cough for about 4 weeks.  It is typically dry but is occasionally productive.  She denies SOB, fever and chills.  She has occasional wheeze and PND.  She thinks the cough is getting better.  She is unsure if his allergies are her asthma.  Typically her asthma is well controlled.  Strong odors such as cigarette smoke or call will trigger her symptoms.  She uses Symbicort twice daily and Ventolin only as needed.  She has no other concerns or questions.  She denies any changes in her health.  Medications and allergies reviewed with patient and updated if appropriate.  Patient Active Problem List   Diagnosis Date Noted  . Vitamin D deficiency 07/18/2018  . Abnormal EKG 07/07/2017  . Closed fracture of right distal fibula 05/09/2017  . Hyperglycemia 03/29/2017  . Essential hypertension, benign 03/11/2016  . GERD (gastroesophageal reflux disease) 03/11/2016  . Generalized anxiety disorder 02/17/2015  . Obesity 02/17/2015  . Osteopenia 11/26/2009  . Asthma, mild persistent 11/05/2009  . HYPERCHOLESTEROLEMIA, BORDERLINE 08/28/2008  . UNSPECIFIED PAROXYSMAL TACHYCARDIA 08/28/2008  . POSITIVE PPD 08/28/2008  . DEGENERATIVE JOINT DISEASE 09/06/2007  . Chronic insomnia 09/06/2007  . EDEMA 08/27/2007    Current Outpatient Medications on File Prior to Visit  Medication Sig Dispense Refill  . aspirin 81 MG tablet Take 81 mg by mouth daily.      . budesonide-formoterol (SYMBICORT) 80-4.5 MCG/ACT inhaler Inhale 2 puffs into the lungs 2 (two) times daily. 1 Inhaler 12  . BYSTOLIC 20 MG TABS TAKE 1/2 TABLET (10 MG TOTAL) BY MOUTH DAILY 30 tablet 0  . cholecalciferol (VITAMIN D) 1000 UNITS tablet Take 1,000 Units by mouth daily.      . Cholecalciferol (VITAMIN D3) 50000 units TABS Take 50,000 Units by mouth once a week. 12 tablet 0  .  clonazePAM (KLONOPIN) 0.5 MG tablet TAKE 1 TO 3 TABLETS BY MOUTH NIGHTLY AT BEDTIME AS NEEDED 90 tablet 5  . cyclobenzaprine (FLEXERIL) 10 MG tablet TAKE 1 TABLET (10 MG TOTAL) 3 (THREE) TIMES DAILY AS NEEDED BY MOUTH. 90 tablet 0  . LORazepam (ATIVAN) 1 MG tablet TAKE 1 TABLET BY MOUTH EVERY 8 HOURS AS NEEDED FOR ANXIETY 90 tablet 5  . nebivolol (BYSTOLIC) 10 MG tablet Take 1 tablet (10 mg total) daily by mouth. 21 tablet 0  . triazolam (HALCION) 0.25 MG tablet TAKE 1 TABLET BY MOUTH NIGHTLY AT BEDTIME AS NEEDED 30 tablet 5  . VENTOLIN HFA 108 (90 Base) MCG/ACT inhaler INHALE 2 PUFFS INTO THE LUNGS EVERY 6 HOURS AS NEEDED FOR WHEEZING OR SHORTNESS OF BREATH. 18 g 12   No current facility-administered medications on file prior to visit.     Past Medical History:  Diagnosis Date  . ASTHMA, UNSPECIFIED, UNSPECIFIED STATUS   . Closed fracture of right distal fibula 05/09/2017  . DEGENERATIVE JOINT DISEASE   . Family history of adverse reaction to anesthesia    ponv  . HYPERCHOLESTEROLEMIA, BORDERLINE   . NECK PAIN 11/05/2009  . OSTEOPENIA   . POSITIVE PPD   . SLEEP DISORDER, HX OF   . UNSPECIFIED PAROXYSMAL TACHYCARDIA     Past Surgical History:  Procedure Laterality Date  . CESAREAN SECTION  1984  . DILATION AND CURETTAGE OF UTERUS  x 3  . ORIF FIBULA FRACTURE Right 05/09/2017   Procedure: OPEN REDUCTION INTERNAL FIXATION (ORIF) RIGHT DISTAL FIBULA FRACTURE;  Surgeon: Teryl Lucy, MD;  Location: [facility];  Service: Orthopedics;  Laterality: Right;    Social History   Socioeconomic History  . Marital status: Married    Spouse name: Not on file  . Number of children: 3  . Years of education: Not on file  . Highest education level: Not on file  Occupational History  . Occupation: PATIENT CARE COORDIN    Employer: [employer]  Social Needs  . Financial resource strain: Not on file  . Food insecurity:    Worry: Not on file    Inability: Not on  file  . Transportation needs:    Medical: Not on file    Non-medical: Not on file  Tobacco Use  . Smoking status: Former Smoker    Last attempt to quit: 05/23/2000    Years since quitting: 18.1  . Smokeless tobacco: Never Used  Substance and Sexual Activity  . Alcohol use: No  . Drug use: No  . Sexual activity: Not on file  Lifestyle  . Physical activity:    Days per week: Not on file    Minutes per session: Not on file  . Stress: Not on file  Relationships  . Social connections:    Talks on phone: Not on file    Gets together: Not on file    Attends religious service: Not on file    Active member of club or organization: Not on file    Attends meetings of clubs or organizations: Not on file    Relationship status: Not on file  Other Topics Concern  . Not on file  Social History Narrative  . Not on file    Family History  Problem Relation Age of Onset  . Diabetes Father   . Hypertension Father   . Stroke Mother   . Atrial fibrillation Mother   . Prostate cancer Maternal Grandfather   . Atrial fibrillation Maternal Grandmother   . Heart failure Maternal Grandmother        CHF  . Hypertension Maternal Grandmother   . Heart disease Daughter        heart transplant  . Pancreatitis Daughter     Review of Systems  Constitutional: Negative for chills and fever.  Eyes: Negative for visual disturbance.  Respiratory: Positive for cough and wheezing (occasional). Negative for shortness of breath.   Cardiovascular: Positive for palpitations (occ). Negative for chest pain and leg swelling.  Gastrointestinal: Negative for abdominal pain, blood in stool, constipation, diarrhea and nausea.       Gerd controlled  Genitourinary: Negative for dysuria and hematuria.  Musculoskeletal: Positive for back pain. Negative for arthralgias.  Skin: Negative for color change and rash.  Neurological: Negative for dizziness, light-headedness and headaches.  Psychiatric/Behavioral: Positive  for sleep disturbance. Negative for dysphoric mood. The patient is not nervous/anxious.        Objective:   Vitals:   07/19/18 0749  BP: 128/78  Pulse: 70  Resp: 16  Temp: 98.2 F (36.8 C)  SpO2: 99%   Filed Weights   07/19/18 0749  Weight: 185 lb 6.4 oz (84.1 kg)   Body mass index is 33.91 kg/m.  BP Readings from Last 3 Encounters:  07/19/18 128/78  07/07/17 122/78  05/09/17 (!) 143/78    Wt Readings from Last 3 Encounters:  07/19/18 185 lb 6.4 oz (84.1 kg)  07/07/17 183 lb (83 kg)  05/09/17 195 lb (88.5 kg)     Physical Exam Constitutional: She appears well-developed and well-nourished. No distress.  HENT:  Head: Normocephalic and atraumatic.  Right Ear: External ear normal. Normal ear canal and TM Left Ear: External ear normal.  Normal ear canal and TM Mouth/Throat: Oropharynx is clear and moist.  Eyes: Conjunctivae and EOM are normal.  Neck: Neck supple. No tracheal deviation present. No thyromegaly present. No carotid bruit  Cardiovascular: Normal rate, regular rhythm and normal heart sounds.   No murmur heard.  No edema. Pulmonary/Chest: Effort normal and breath sounds normal. No respiratory distress. She has no wheezes. She has no rales.  Breast: deferred to Gyn Abdominal: Soft. She exhibits no distension. There is no tenderness.  Lymphadenopathy: She has no cervical adenopathy.  Skin: Skin is warm and dry. She is not diaphoretic.  Psychiatric: She has a normal mood and affect. Her behavior is normal.        Assessment & Plan:   Physical exam: Screening blood work  ordered Immunizations  all up to date Colonoscopy   - never had one-referred Mammogram   Up to date  Gyn    Up to date  Dexa   Up to date  Eye exams  Up to date  EKG  Done 04/2017 Exercise  Active, but not regular exercise Weight - advised weight loss Skin  No concenrs Substance abuse   none  See Problem List for Assessment and Plan of chronic medical problems.   Follow-up  annually

## 2018-07-19 ENCOUNTER — Ambulatory Visit (INDEPENDENT_AMBULATORY_CARE_PROVIDER_SITE_OTHER): Payer: 59 | Admitting: Internal Medicine

## 2018-07-19 ENCOUNTER — Encounter: Payer: Self-pay | Admitting: Internal Medicine

## 2018-07-19 ENCOUNTER — Other Ambulatory Visit (INDEPENDENT_AMBULATORY_CARE_PROVIDER_SITE_OTHER): Payer: 59

## 2018-07-19 VITALS — BP 128/78 | HR 70 | Temp 98.2°F | Resp 16 | Ht 62.0 in | Wt 185.4 lb

## 2018-07-19 DIAGNOSIS — K219 Gastro-esophageal reflux disease without esophagitis: Secondary | ICD-10-CM

## 2018-07-19 DIAGNOSIS — R739 Hyperglycemia, unspecified: Secondary | ICD-10-CM | POA: Diagnosis not present

## 2018-07-19 DIAGNOSIS — Z0000 Encounter for general adult medical examination without abnormal findings: Secondary | ICD-10-CM

## 2018-07-19 DIAGNOSIS — F5104 Psychophysiologic insomnia: Secondary | ICD-10-CM | POA: Diagnosis not present

## 2018-07-19 DIAGNOSIS — I10 Essential (primary) hypertension: Secondary | ICD-10-CM

## 2018-07-19 DIAGNOSIS — F411 Generalized anxiety disorder: Secondary | ICD-10-CM

## 2018-07-19 DIAGNOSIS — E559 Vitamin D deficiency, unspecified: Secondary | ICD-10-CM

## 2018-07-19 DIAGNOSIS — R05 Cough: Secondary | ICD-10-CM

## 2018-07-19 DIAGNOSIS — Z1211 Encounter for screening for malignant neoplasm of colon: Secondary | ICD-10-CM

## 2018-07-19 DIAGNOSIS — M85851 Other specified disorders of bone density and structure, right thigh: Secondary | ICD-10-CM | POA: Diagnosis not present

## 2018-07-19 DIAGNOSIS — J4531 Mild persistent asthma with (acute) exacerbation: Secondary | ICD-10-CM | POA: Diagnosis not present

## 2018-07-19 DIAGNOSIS — R059 Cough, unspecified: Secondary | ICD-10-CM | POA: Insufficient documentation

## 2018-07-19 LAB — HEMOGLOBIN A1C: Hgb A1c MFr Bld: 5.6 % (ref 4.6–6.5)

## 2018-07-19 LAB — LIPID PANEL
Cholesterol: 183 mg/dL (ref 0–200)
HDL: 44.2 mg/dL (ref >39.00)
LDL Cholesterol: 120 mg/dL — ABNORMAL HIGH (ref 0–99)
NonHDL: 138.51
TRIGLYCERIDES: 91 mg/dL (ref 0.0–149.0)
Total CHOL/HDL Ratio: 4
VLDL: 18.2 mg/dL (ref 0.0–40.0)

## 2018-07-19 LAB — CBC WITH DIFFERENTIAL/PLATELET
Basophils Absolute: 0.1 10*3/uL (ref 0.0–0.1)
Basophils Relative: 1.1 % (ref 0.0–3.0)
EOS PCT: 13.4 % — AB (ref 0.0–5.0)
Eosinophils Absolute: 1.1 10*3/uL — ABNORMAL HIGH (ref 0.0–0.7)
HCT: 42.3 % (ref 36.0–46.0)
Hemoglobin: 14.5 g/dL (ref 12.0–15.0)
Lymphocytes Relative: 22.5 % (ref 12.0–46.0)
Lymphs Abs: 1.8 10*3/uL (ref 0.7–4.0)
MCHC: 34.2 g/dL (ref 30.0–36.0)
MCV: 86.4 fl (ref 78.0–100.0)
Monocytes Absolute: 0.7 10*3/uL (ref 0.1–1.0)
Monocytes Relative: 8.4 % (ref 3.0–12.0)
Neutro Abs: 4.3 10*3/uL (ref 1.4–7.7)
Neutrophils Relative %: 54.6 % (ref 43.0–77.0)
Platelets: 261 10*3/uL (ref 150.0–400.0)
RBC: 4.89 Mil/uL (ref 3.87–5.11)
RDW: 12.6 % (ref 11.5–15.5)
WBC: 7.9 10*3/uL (ref 4.0–10.5)

## 2018-07-19 LAB — COMPREHENSIVE METABOLIC PANEL
ALT: 11 U/L (ref 0–35)
AST: 11 U/L (ref 0–37)
Albumin: 4.1 g/dL (ref 3.5–5.2)
Alkaline Phosphatase: 67 U/L (ref 39–117)
BUN: 13 mg/dL (ref 6–23)
CO2: 26 mEq/L (ref 19–32)
Calcium: 9.4 mg/dL (ref 8.4–10.5)
Chloride: 106 mEq/L (ref 96–112)
Creatinine, Ser: 0.78 mg/dL (ref 0.40–1.20)
GFR: 74.47 mL/min (ref >60.00)
Glucose, Bld: 111 mg/dL — ABNORMAL HIGH (ref 70–99)
Potassium: 4.8 mEq/L (ref 3.5–5.1)
Sodium: 140 mEq/L (ref 135–145)
TOTAL PROTEIN: 6.6 g/dL (ref 6.0–8.3)
Total Bilirubin: 0.4 mg/dL (ref 0.2–1.2)

## 2018-07-19 LAB — VITAMIN D 25 HYDROXY (VIT D DEFICIENCY, FRACTURES): VITD: 31.37 ng/mL (ref 30.00–100.00)

## 2018-07-19 LAB — TSH: TSH: 1.12 u[IU]/mL (ref 0.35–4.50)

## 2018-07-19 MED ORDER — PANTOPRAZOLE SODIUM 40 MG PO TBEC: 40.0000 mg | DELAYED_RELEASE_TABLET | Freq: Every day | ORAL | 3 refills | Status: DC

## 2018-07-19 NOTE — Assessment & Plan Note (Signed)
Mild, persistent Uses Symbicort twice daily Ventolin rarely Follows with pulmonary Overall controlled

## 2018-07-19 NOTE — Assessment & Plan Note (Signed)
Experiencing cough for the past 4 weeks-primarily dry, occasionally productive Occasional wheeze, but no other concerning symptoms Likely allergy or asthma related Advised taking allergy medications No need for an antibiotic

## 2018-07-19 NOTE — Assessment & Plan Note (Signed)
Overall controlled Taking triazolam

## 2018-07-19 NOTE — Assessment & Plan Note (Signed)
GERD controlled Continue daily medication  

## 2018-07-19 NOTE — Assessment & Plan Note (Signed)
Check a1c Low sugar / carb diet Stressed regular exercise   

## 2018-07-19 NOTE — Assessment & Plan Note (Signed)
Managed by pulmonary

## 2018-07-19 NOTE — Assessment & Plan Note (Signed)
DEXA up-to-date Active, but no regular exercise which would be ideal Taking calcium and vitamin D Will check vitamin D level

## 2018-07-19 NOTE — Assessment & Plan Note (Addendum)
BP well controlled Current regimen effective and well tolerated Continue current medications at current doses Cmp, tsh 

## 2018-07-19 NOTE — Assessment & Plan Note (Signed)
Was very low last year Recheck level Taking vitamin D daily

## 2018-08-06 ENCOUNTER — Other Ambulatory Visit: Payer: Self-pay | Admitting: Internal Medicine

## 2018-08-07 NOTE — Telephone Encounter (Signed)
Asks refill klonopin-     E-sent to [facility]

## 2018-10-10 ENCOUNTER — Other Ambulatory Visit: Payer: Self-pay | Admitting: Internal Medicine

## 2018-11-30 DIAGNOSIS — Z01419 Encounter for gynecological examination (general) (routine) without abnormal findings: Secondary | ICD-10-CM | POA: Diagnosis not present

## 2018-11-30 DIAGNOSIS — Z1389 Encounter for screening for other disorder: Secondary | ICD-10-CM | POA: Diagnosis not present

## 2018-11-30 DIAGNOSIS — Z1231 Encounter for screening mammogram for malignant neoplasm of breast: Secondary | ICD-10-CM | POA: Diagnosis not present

## 2018-11-30 DIAGNOSIS — Z6835 Body mass index (BMI) 35.0-35.9, adult: Secondary | ICD-10-CM | POA: Diagnosis not present

## 2019-01-10 ENCOUNTER — Other Ambulatory Visit: Payer: Self-pay | Admitting: Internal Medicine

## 2019-01-10 NOTE — Telephone Encounter (Signed)
Halcion refill e-sent 

## 2019-01-10 NOTE — Telephone Encounter (Signed)
Is this appropriate for refill ? 

## 2019-01-25 ENCOUNTER — Ambulatory Visit
Admission: RE | Admit: 2019-01-25 | Discharge: 2019-01-25 | Disposition: A | Discharge status: Home / Self Care | Payer: 59 | Attending: Pulmonary Disease | Admitting: Pulmonary Disease

## 2019-01-25 ENCOUNTER — Ambulatory Visit
Admission: RE | Admit: 2019-01-25 | Discharge: 2019-01-25 | Disposition: A | Discharge status: Home / Self Care | Payer: 59 | Source: Ambulatory Visit | Attending: Pulmonary Disease | Admitting: Pulmonary Disease

## 2019-01-25 ENCOUNTER — Other Ambulatory Visit: Payer: Self-pay | Admitting: Pulmonary Disease

## 2019-01-25 DIAGNOSIS — S9782XA Crushing injury of left foot, initial encounter: Secondary | ICD-10-CM

## 2019-01-25 DIAGNOSIS — M79672 Pain in left foot: Secondary | ICD-10-CM | POA: Diagnosis not present

## 2019-01-25 DIAGNOSIS — S99922A Unspecified injury of left foot, initial encounter: Secondary | ICD-10-CM | POA: Diagnosis not present

## 2019-01-25 NOTE — Progress Notes (Signed)
LEFT foot trauma this AM now swollen and painful. R/O fracture.

## 2019-02-01 ENCOUNTER — Other Ambulatory Visit: Payer: Self-pay | Admitting: Internal Medicine

## 2019-02-01 NOTE — Telephone Encounter (Signed)
Clonazepam refill e-sent 

## 2019-02-01 NOTE — Telephone Encounter (Signed)
Appropriate for refill

## 2019-02-06 ENCOUNTER — Encounter: Payer: Self-pay | Admitting: Gastroenterology

## 2019-02-22 ENCOUNTER — Other Ambulatory Visit: Payer: Self-pay | Admitting: Internal Medicine

## 2019-02-28 ENCOUNTER — Ambulatory Visit: Payer: 59 | Admitting: Internal Medicine

## 2019-02-28 ENCOUNTER — Other Ambulatory Visit: Payer: Self-pay

## 2019-02-28 ENCOUNTER — Encounter: Payer: Self-pay | Admitting: Internal Medicine

## 2019-02-28 DIAGNOSIS — F5104 Psychophysiologic insomnia: Secondary | ICD-10-CM

## 2019-02-28 DIAGNOSIS — F411 Generalized anxiety disorder: Secondary | ICD-10-CM

## 2019-02-28 DIAGNOSIS — J4530 Mild persistent asthma, uncomplicated: Secondary | ICD-10-CM

## 2019-02-28 DIAGNOSIS — Z9289 Personal history of other medical treatment: Secondary | ICD-10-CM

## 2019-02-28 MED ORDER — LORAZEPAM 1 MG PO TABS: 1.0000 mg | ORAL_TABLET | Freq: Three times a day (TID) | ORAL | 5 refills | Status: DC | PRN

## 2019-02-28 MED ORDER — BUDESONIDE-FORMOTEROL FUMARATE 80-4.5 MCG/ACT IN AERO: 2.0000 | INHALATION_SPRAY | Freq: Two times a day (BID) | RESPIRATORY_TRACT | 12 refills | Status: DC

## 2019-02-28 MED ORDER — ALBUTEROL SULFATE HFA 108 (90 BASE) MCG/ACT IN AERS: INHALATION_SPRAY | RESPIRATORY_TRACT | 12 refills | Status: AC

## 2019-02-28 NOTE — Patient Instructions (Signed)
Med refills sent  Please don't skip that flu shot  Please call if we can help

## 2019-02-28 NOTE — Assessment & Plan Note (Signed)
Positive in past. Negative CXR and no hx of confirmed exposure.

## 2019-02-28 NOTE — Assessment & Plan Note (Addendum)
Settled on Symbicort 80 and albuterol hfa. These seem sufficient Plan- med refills, flu shot at her offifce

## 2019-02-28 NOTE — Assessment & Plan Note (Signed)
Controlled on current meds. She has been able to manage with attention to sleep hygiene.

## 2019-02-28 NOTE — Assessment & Plan Note (Signed)
Anxiety / depression/ Insomnia following death of daughter 10 years after heart transplant for congenital heart disease. Stable for now. Understands eventual need for withdrawal from benzos, may need  Mental health support then. No abuse, no suicidal ideation, no diversion.

## 2019-02-28 NOTE — Progress Notes (Signed)
HPI F  former smoker followed for chronic insomnia, complicated by hx asthma, PAT, +PPD, GERD, Anxiety Daughter died in 30-Sep-2012, 10 yrs after heart transplant-congenital heart disease.  ------------------------------------------------------------------------------------  [birth year] yoF  former smoker followed for chronic insomnia, asthma, complicated by  PAT, +PPD, GERD, Anxiety FOLLOW UP FOR Chronic Insomnia, as long as she takes her medication her sleeping is fine. patient states that now she is having SOB  for about 6 months     Shortness of breath with rest and exertion. Some wheezing and dry cough. Using rescue inhaler twice daily while continuing Breo. Aware that weight gain contributes. May be worse in pollen season but not having much rhinitis. Denies edema, palpitation or chest pain Triggers include perfumes, smoke and strong odors.. Chronic insomnia continues, controlled with clonazepam and either Halcion or Xanax or lorazepam at bedtime. Denies daytime carryover. Says she doesn't sleep at all if she runs out. Still makes references to the death of her daughter several years ago  10/8/2020May [birth year], [age] yoF  former smoker followed for chronic insomnia, complicated by hx asthma, PAT, +PPD, GERD, Anxiety -----Followed for asthma & and insomnia; pt states breathing is at baseline; taking clonazepam and triazolam for sleep Ventolin hfa, Symbicort 80 Medication use is stable. Occasional ativan for daytime stress. Will eventually need gradual withdrawal from benzos and may need Behavioral Health emotional support with that, especially if still working.  Did home sleep test informally through her office and reports AHI less than 5. Not told of significant snoring or witnessed apnea. Asthma controlled. Occasional need for rescue inhaler, including sometimes at ngiht. CXR 10/06/2018-  IMPRESSION: No acute cardiopulmonary disease.  No evidence of TB.  ROS-see HPI    + = positive Constitutional:   No-    weight loss, night sweats, fevers, chills, fatigue, lassitude. HEENT:   No-  headaches, difficulty swallowing, tooth/dental problems, sore throat,       No-  sneezing, itching, ear ache, nasal congestion, post nasal drip,  CV:  No-   chest pain, orthopnea, PND, swelling in lower extremities, anasarca, dizziness, palpitations Resp: +  shortness of breath with exertion or at rest.              No-   productive cough,  +non-productive cough,  No- coughing up of blood.              No-   change in color of mucus.  +- wheezing.   Skin: No-   rash or lesions. GI:  No-   heartburn, indigestion, abdominal pain, nausea, vomiting, GU:. MS:  No-   joint pain or swelling.  No- decreased range of motion.  Some- back pain. Neuro-     nothing unusual Psych:  No- change in mood or affect. + depression or anxiety.  No memory loss.  OBJ- Physical Exam General- Alert, Oriented, Affect-appropriate, Distress- none acute, + obese Skin- rash-none, lesions- none, excoriation- none Lymphadenopathy- none Head- atraumatic            Eyes- Gross vision intact, PERRLA, conjunctivae and secretions clear            Ears- Hearing, canals-normal            Nose- Clear, no-Septal dev, mucus, polyps, erosion, perforation             Throat- Mallampati III , mucosa clear , drainage- none, tonsils- atrophic Neck- flexible , trachea midline, no stridor , thyroid nl, carotid no bruit Chest - symmetrical excursion ,  unlabored           Heart/CV- RRR , no murmur , no gallop  , no rub, nl s1 s2                           - JVD- none , edema- none, stasis changes- none, varices- none           Lung- clear to P&A, wheeze- none, cough- none , dullness-none, rub- none           Chest wall-  Abd-  Br/ Gen/ Rectal- Not done, not indicated Extrem- cyanosis- none, clubbing, none, atrophy- none, strength- nl Neuro- grossly intact to observation

## 2019-03-04 ENCOUNTER — Encounter: Payer: 59 | Admitting: Gastroenterology

## 2019-06-22 ENCOUNTER — Other Ambulatory Visit: Payer: Self-pay | Admitting: Internal Medicine

## 2019-07-12 ENCOUNTER — Other Ambulatory Visit: Payer: Self-pay | Admitting: Internal Medicine

## 2019-07-17 ENCOUNTER — Other Ambulatory Visit: Payer: Self-pay | Admitting: Internal Medicine

## 2019-07-17 MED ORDER — TRIAZOLAM 0.25 MG PO TABS: ORAL_TABLET | ORAL | 5 refills | Status: DC

## 2019-07-23 ENCOUNTER — Encounter: Payer: 59 | Admitting: Internal Medicine

## 2019-07-25 NOTE — Progress Notes (Signed)
Subjective:    Patient ID: Carrie Dalton, female    DOB: Aug 25, 1955, 64 y.o.   MRN: [id number]  HPI She is here for a physical exam.   For about 20 years she has had a cramping her RUQ.  It comes and goes.   She denies any relation to activity or time of day.  She plans on talking to GI about this when she saw them.  She has had some urinary issues.  She has cramping, feeling like her bladder is not emptying all the way.  Her urine flow is not how it should be.  She has to go more often and it is not a lot when she goes.  She has seen urology.  She takes azo prn and it helps.   BP at home 120/72.    Medications and allergies reviewed with patient and updated if appropriate.  Patient Active Problem List   Diagnosis Date Noted  . Cough 07/19/2018  . Vitamin D deficiency 07/18/2018  . Abnormal EKG 07/07/2017  . Closed fracture of right distal fibula 05/09/2017  . Hyperglycemia 03/29/2017  . Essential hypertension, benign 03/11/2016  . GERD (gastroesophageal reflux disease) 03/11/2016  . Generalized anxiety disorder 02/17/2015  . Obesity 02/17/2015  . Osteopenia 11/26/2009  . Asthma, mild persistent 11/05/2009  . HYPERCHOLESTEROLEMIA, BORDERLINE 08/28/2008  . UNSPECIFIED PAROXYSMAL TACHYCARDIA 08/28/2008  . History of TB skin testing 08/28/2008  . DEGENERATIVE JOINT DISEASE 09/06/2007  . Chronic insomnia 09/06/2007    Current Outpatient Medications on File Prior to Visit  Medication Sig Dispense Refill  . albuterol (VENTOLIN HFA) 108 (90 Base) MCG/ACT inhaler INHALE 2 PUFFS INTO THE LUNGS EVERY 6 HOURS AS NEEDED FOR WHEEZING OR SHORTNESS OF BREATH. 18 g 12  . aspirin 81 MG tablet Take 81 mg by mouth daily.      . budesonide-formoterol (SYMBICORT) 80-4.5 MCG/ACT inhaler Inhale 2 puffs into the lungs 2 (two) times daily. 1 Inhaler 12  . BYSTOLIC 20 MG TABS TAKE 1/2 TABLET BY MOUTH DAILY 45 tablet 1  . cholecalciferol (VITAMIN D) 1000 UNITS tablet Take 1,000 Units by mouth  daily.      . clonazePAM (KLONOPIN) 0.5 MG tablet TAKE 1 TO 3 TABLETS BY MOUTH NIGHTLY AT BEDTIME AS NEEDED 90 tablet 5  . cyclobenzaprine (FLEXERIL) 10 MG tablet TAKE 1 TABLET (10 MG TOTAL) 3 (THREE) TIMES DAILY AS NEEDED BY MOUTH. 90 tablet 0  . LORazepam (ATIVAN) 1 MG tablet Take 1 tablet (1 mg total) by mouth every 8 (eight) hours as needed. for anxiety 90 tablet 5  . pantoprazole (PROTONIX) 40 MG tablet Take 1 tablet (40 mg total) by mouth daily. 90 tablet 3  . Phenazopyridine HCl (PYRIDIUM PO) Take by mouth.    . triazolam (HALCION) 0.25 MG tablet TAKE 1 TABLET BY MOUTH NIGHTLY AT BEDTIME AS NEEDED 30 tablet 5   No current facility-administered medications on file prior to visit.    Past Medical History:  Diagnosis Date  . ASTHMA, UNSPECIFIED, UNSPECIFIED STATUS   . Closed fracture of right distal fibula 05/09/2017  . DEGENERATIVE JOINT DISEASE   . Family history of adverse reaction to anesthesia    ponv  . HYPERCHOLESTEROLEMIA, BORDERLINE   . NECK PAIN 11/05/2009  . OSTEOPENIA   . POSITIVE PPD   . SLEEP DISORDER, HX OF   . UNSPECIFIED PAROXYSMAL TACHYCARDIA     Past Surgical History:  Procedure Laterality Date  . CESAREAN SECTION  1984  . DILATION AND  CURETTAGE OF UTERUS     x 3  . ORIF FIBULA FRACTURE Right 05/09/2017   Procedure: OPEN REDUCTION INTERNAL FIXATION (ORIF) RIGHT DISTAL FIBULA FRACTURE;  Surgeon: Marchia Bond, MD;  Location: [facility];  Service: Orthopedics;  Laterality: Right;    Social History   Socioeconomic History  . Marital status: Married    Spouse name: Not on file  . Number of children: 3  . Years of education: Not on file  . Highest education level: Not on file  Occupational History  . Occupation: PATIENT CARE COORDIN    Employer: [employer]  Tobacco Use  . Smoking status: Former Smoker    Packs/day: 0.50    Types: Cigarettes    Quit date: 05/23/2000    Years since quitting: 19.1  . Smokeless tobacco: Never  Used  Substance and Sexual Activity  . Alcohol use: No  . Drug use: No  . Sexual activity: Not on file  Other Topics Concern  . Not on file  Social History Narrative  . Not on file   Social Determinants of Health   Financial Resource Strain:   . Difficulty of Paying Living Expenses: Not on file  Food Insecurity:   . Worried About Charity fundraiser in the Last Year: Not on file  . Ran Out of Food in the Last Year: Not on file  Transportation Needs:   . Lack of Transportation (Medical): Not on file  . Lack of Transportation (Non-Medical): Not on file  Physical Activity:   . Days of Exercise per Week: Not on file  . Minutes of Exercise per Session: Not on file  Stress:   . Feeling of Stress : Not on file  Social Connections:   . Frequency of Communication with Friends and Family: Not on file  . Frequency of Social Gatherings with Friends and Family: Not on file  . Attends Religious Services: Not on file  . Active Member of Clubs or Organizations: Not on file  . Attends Archivist Meetings: Not on file  . Marital Status: Not on file    Family History  Problem Relation Age of Onset  . Diabetes Father   . Hypertension Father   . Stroke Mother   . Atrial fibrillation Mother   . Prostate cancer Maternal Grandfather   . Atrial fibrillation Maternal Grandmother   . Heart failure Maternal Grandmother        CHF  . Hypertension Maternal Grandmother   . Heart disease Daughter        heart transplant  . Pancreatitis Daughter     Review of Systems  Constitutional: Negative for chills and fever.  Eyes: Negative for visual disturbance.  Respiratory: Negative for cough, shortness of breath and wheezing.   Cardiovascular: Positive for leg swelling. Negative for chest pain and palpitations.  Gastrointestinal: Positive for nausea (2/mo). Negative for abdominal pain, blood in stool, constipation and diarrhea.       GERD controlled  Genitourinary: Positive for frequency.  Negative for dysuria.  Musculoskeletal: Negative for arthralgias and back pain.  Skin: Negative for color change and rash.  Neurological: Positive for headaches (occ). Negative for dizziness and light-headedness.  Psychiatric/Behavioral: Positive for sleep disturbance. Negative for dysphoric mood. The patient is not nervous/anxious.        Objective:   Vitals:   07/26/19 0940  BP: (!) 152/82  Pulse: 61  Resp: 16  Temp: 98.3 F (36.8 C)  SpO2: 97%   Filed Weights  07/26/19 0940  Weight: 200 lb (90.7 kg)   Body mass index is 36.58 kg/m.  BP Readings from Last 3 Encounters:  07/26/19 (!) 152/82  02/28/19 (!) 136/96  07/19/18 128/78    Wt Readings from Last 3 Encounters:  07/26/19 200 lb (90.7 kg)  02/28/19 195 lb 9.6 oz (88.7 kg)  07/19/18 185 lb 6.4 oz (84.1 kg)     Physical Exam Constitutional: She appears well-developed and well-nourished. No distress.  HENT:  Head: Normocephalic and atraumatic.  Right Ear: External ear normal. Normal ear canal and TM Left Ear: External ear normal.  Normal ear canal and TM Mouth/Throat: Oropharynx is clear and moist.  Eyes: Conjunctivae and EOM are normal.  Neck: Neck supple. No tracheal deviation present. No thyromegaly present.  No carotid bruit  Cardiovascular: Normal rate, regular rhythm and normal heart sounds.   No murmur heard.  No edema. Pulmonary/Chest: Effort normal and breath sounds normal. No respiratory distress. She has no wheezes. She has no rales.  Breast: deferred   Abdominal: Soft. She exhibits no distension. There is no tenderness.  Lymphadenopathy: She has no cervical adenopathy.  Skin: Skin is warm and dry. She is not diaphoretic.  Psychiatric: She has a normal mood and affect. Her behavior is normal.        Assessment & Plan:   Physical exam: Screening blood work    ordered Immunizations  Had flu, shingrix #2 today, had covid  Colonoscopy  - will schedule Mammogram  Up to date  Gyn  Up to date    Dexa  Up to date  Eye exams     Due - will schedule Exercise  none Weight  Has gained weight --- encouraged weight loss Substance abuse   none Derm  - sees them Q 2 yrs   See Problem List for Assessment and Plan of chronic medical problems.     This visit occurred during the SARS-CoV-2 public health emergency.  Safety protocols were in place, including screening questions prior to the visit, additional usage of staff PPE, and extensive cleaning of exam room while observing appropriate contact time as indicated for disinfecting solutions.

## 2019-07-25 NOTE — Patient Instructions (Addendum)
Blood work was ordered.    All other Health Maintenance issues reviewed.   All recommended immunizations and age-appropriate screenings are up-to-date or discussed.  shingrix #2 immunization administered today.   Medications reviewed and updated.  Changes include :  none     Please followup in 1 year    Health Maintenance, Female Adopting a healthy lifestyle and getting preventive care are important in promoting health and wellness. Ask your health care provider about:  The right schedule for you to have regular tests and exams.  Things you can do on your own to prevent diseases and keep yourself healthy. What should I know about diet, weight, and exercise? Eat a healthy diet   Eat a diet that includes plenty of vegetables, fruits, low-fat dairy products, and lean protein.  Do not eat a lot of foods that are high in solid fats, added sugars, or sodium. Maintain a healthy weight Body mass index (BMI) is used to identify weight problems. It estimates body fat based on height and weight. Your health care provider can help determine your BMI and help you achieve or maintain a healthy weight. Get regular exercise Get regular exercise. This is one of the most important things you can do for your health. Most adults should:  Exercise for at least 150 minutes each week. The exercise should increase your heart rate and make you sweat (moderate-intensity exercise).  Do strengthening exercises at least twice a week. This is in addition to the moderate-intensity exercise.  Spend less time sitting. Even light physical activity can be beneficial. Watch cholesterol and blood lipids Have your blood tested for lipids and cholesterol at 65 years of age, then have this test every 5 years. Have your cholesterol levels checked more often if:  Your lipid or cholesterol levels are high.  You are older than 65 years of age.  You are at high risk for heart disease. What should I know about  cancer screening? Depending on your health history and family history, you may need to have cancer screening at various ages. This may include screening for:  Breast cancer.  Cervical cancer.  Colorectal cancer.  Skin cancer.  Lung cancer. What should I know about heart disease, diabetes, and high blood pressure? Blood pressure and heart disease  High blood pressure causes heart disease and increases the risk of stroke. This is more likely to develop in people who have high blood pressure readings, are of African descent, or are overweight.  Have your blood pressure checked: ? Every 3-5 years if you are 68-42 years of age. ? Every year if you are 75 years old or older. Diabetes Have regular diabetes screenings. This checks your fasting blood sugar level. Have the screening done:  Once every three years after age 41 if you are at a normal weight and have a low risk for diabetes.  More often and at a younger age if you are overweight or have a high risk for diabetes. What should I know about preventing infection? Hepatitis B If you have a higher risk for hepatitis B, you should be screened for this virus. Talk with your health care provider to find out if you are at risk for hepatitis B infection. Hepatitis C Testing is recommended for:  Everyone born from [birth year] through 1966.  Anyone with known risk factors for hepatitis C. Sexually transmitted infections (STIs)  Get screened for STIs, including gonorrhea and chlamydia, if: ? You are sexually active and are younger than 65 years  of age. ? You are older than 65 years of age and your health care provider tells you that you are at risk for this type of infection. ? Your sexual activity has changed since you were last screened, and you are at increased risk for chlamydia or gonorrhea. Ask your health care provider if you are at risk.  Ask your health care provider about whether you are at high risk for HIV. Your health care  provider may recommend a prescription medicine to help prevent HIV infection. If you choose to take medicine to prevent HIV, you should first get tested for HIV. You should then be tested every 3 months for as long as you are taking the medicine. Pregnancy  If you are about to stop having your period (premenopausal) and you may become pregnant, seek counseling before you get pregnant.  Take 400 to 800 micrograms (mcg) of folic acid every day if you become pregnant.  Ask for birth control (contraception) if you want to prevent pregnancy. Osteoporosis and menopause Osteoporosis is a disease in which the bones lose minerals and strength with aging. This can result in bone fractures. If you are 81 years old or older, or if you are at risk for osteoporosis and fractures, ask your health care provider if you should:  Be screened for bone loss.  Take a calcium or vitamin D supplement to lower your risk of fractures.  Be given hormone replacement therapy (HRT) to treat symptoms of menopause. Follow these instructions at home: Lifestyle  Do not use any products that contain nicotine or tobacco, such as cigarettes, e-cigarettes, and chewing tobacco. If you need help quitting, ask your health care provider.  Do not use street drugs.  Do not share needles.  Ask your health care provider for help if you need support or information about quitting drugs. Alcohol use  Do not drink alcohol if: ? Your health care provider tells you not to drink. ? You are pregnant, may be pregnant, or are planning to become pregnant.  If you drink alcohol: ? Limit how much you use to 0-1 drink a day. ? Limit intake if you are breastfeeding.  Be aware of how much alcohol is in your drink. In the [place] one drink equals one 12 oz bottle of beer (355 mL), one 5 oz glass of wine (148 mL), or one 1 oz glass of hard liquor (44 mL). General instructions  Schedule regular health, dental, and eye exams.  Stay current  with your vaccines.  Tell your health care provider if: ? You often feel depressed. ? You have ever been abused or do not feel safe at home. Summary  Adopting a healthy lifestyle and getting preventive care are important in promoting health and wellness.  Follow your health care provider's instructions about healthy diet, exercising, and getting tested or screened for diseases.  Follow your health care provider's instructions on monitoring your cholesterol and blood pressure. This information is not intended to replace advice given to you by your health care provider. Make sure you discuss any questions you have with your health care provider. Document Revised: 05/02/2018 Document Reviewed: 05/02/2018 Elsevier Patient Education  2020 [employer].

## 2019-07-26 ENCOUNTER — Ambulatory Visit (INDEPENDENT_AMBULATORY_CARE_PROVIDER_SITE_OTHER): Payer: 59 | Admitting: Internal Medicine

## 2019-07-26 ENCOUNTER — Other Ambulatory Visit: Payer: Self-pay

## 2019-07-26 ENCOUNTER — Encounter: Payer: Self-pay | Admitting: Internal Medicine

## 2019-07-26 VITALS — BP 152/82 | HR 61 | Temp 98.3°F | Resp 16 | Ht 62.0 in | Wt 200.0 lb

## 2019-07-26 DIAGNOSIS — R739 Hyperglycemia, unspecified: Secondary | ICD-10-CM

## 2019-07-26 DIAGNOSIS — Z23 Encounter for immunization: Secondary | ICD-10-CM | POA: Diagnosis not present

## 2019-07-26 DIAGNOSIS — I10 Essential (primary) hypertension: Secondary | ICD-10-CM | POA: Diagnosis not present

## 2019-07-26 DIAGNOSIS — E559 Vitamin D deficiency, unspecified: Secondary | ICD-10-CM

## 2019-07-26 DIAGNOSIS — Z0000 Encounter for general adult medical examination without abnormal findings: Secondary | ICD-10-CM

## 2019-07-26 DIAGNOSIS — M85851 Other specified disorders of bone density and structure, right thigh: Secondary | ICD-10-CM | POA: Diagnosis not present

## 2019-07-26 DIAGNOSIS — R300 Dysuria: Secondary | ICD-10-CM

## 2019-07-26 DIAGNOSIS — K219 Gastro-esophageal reflux disease without esophagitis: Secondary | ICD-10-CM | POA: Diagnosis not present

## 2019-07-26 LAB — COMPREHENSIVE METABOLIC PANEL
ALT: 15 U/L (ref 0–35)
AST: 15 U/L (ref 0–37)
Albumin: 3.9 g/dL (ref 3.5–5.2)
Alkaline Phosphatase: 73 U/L (ref 39–117)
BUN: 11 mg/dL (ref 6–23)
CO2: 28 mEq/L (ref 19–32)
Calcium: 9.8 mg/dL (ref 8.4–10.5)
Chloride: 105 mEq/L (ref 96–112)
Creatinine, Ser: 0.79 mg/dL (ref 0.40–1.20)
GFR: 73.15 mL/min (ref >60.00)
Glucose, Bld: 102 mg/dL — ABNORMAL HIGH (ref 70–99)
Potassium: 4.4 mEq/L (ref 3.5–5.1)
Sodium: 141 mEq/L (ref 135–145)
Total Bilirubin: 0.4 mg/dL (ref 0.2–1.2)
Total Protein: 6.7 g/dL (ref 6.0–8.3)

## 2019-07-26 LAB — CBC WITH DIFFERENTIAL/PLATELET
Basophils Absolute: 0.1 10*3/uL (ref 0.0–0.1)
Basophils Relative: 0.7 % (ref 0.0–3.0)
Eosinophils Absolute: 1.3 10*3/uL — ABNORMAL HIGH (ref 0.0–0.7)
Eosinophils Relative: 17.3 % — ABNORMAL HIGH (ref 0.0–5.0)
HCT: 40.8 % (ref 36.0–46.0)
Hemoglobin: 13.7 g/dL (ref 12.0–15.0)
Lymphocytes Relative: 23.3 % (ref 12.0–46.0)
Lymphs Abs: 1.8 10*3/uL (ref 0.7–4.0)
MCHC: 33.7 g/dL (ref 30.0–36.0)
MCV: 88.1 fl (ref 78.0–100.0)
Monocytes Absolute: 0.6 10*3/uL (ref 0.1–1.0)
Monocytes Relative: 7.4 % (ref 3.0–12.0)
Neutro Abs: 3.9 10*3/uL (ref 1.4–7.7)
Neutrophils Relative %: 51.3 % (ref 43.0–77.0)
Platelets: 262 10*3/uL (ref 150.0–400.0)
RBC: 4.63 Mil/uL (ref 3.87–5.11)
RDW: 12.6 % (ref 11.5–15.5)
WBC: 7.6 10*3/uL (ref 4.0–10.5)

## 2019-07-26 LAB — POCT URINALYSIS DIPSTICK
Bilirubin, UA: NEGATIVE
Blood, UA: NEGATIVE
Glucose, UA: POSITIVE — AB
Ketones, UA: NEGATIVE
Leukocytes, UA: NEGATIVE
Nitrite, UA: NEGATIVE
Protein, UA: NEGATIVE
Spec Grav, UA: 1.02 (ref 1.010–1.025)
Urobilinogen, UA: NEGATIVE E.U./dL — AB
pH, UA: 6.5 (ref 5.0–8.0)

## 2019-07-26 LAB — LIPID PANEL
Cholesterol: 185 mg/dL (ref 0–200)
HDL: 47.3 mg/dL (ref >39.00)
LDL Cholesterol: 120 mg/dL — ABNORMAL HIGH (ref 0–99)
NonHDL: 137.21
Total CHOL/HDL Ratio: 4
Triglycerides: 85 mg/dL (ref 0.0–149.0)
VLDL: 17 mg/dL (ref 0.0–40.0)

## 2019-07-26 LAB — VITAMIN D 25 HYDROXY (VIT D DEFICIENCY, FRACTURES): VITD: 19.7 ng/mL — ABNORMAL LOW (ref 30.00–100.00)

## 2019-07-26 LAB — TSH: TSH: 1 u[IU]/mL (ref 0.35–4.50)

## 2019-07-26 LAB — HEMOGLOBIN A1C: Hgb A1c MFr Bld: 5.5 % (ref 4.6–6.5)

## 2019-07-26 MED ORDER — PANTOPRAZOLE SODIUM 40 MG PO TBEC: 40.0000 mg | DELAYED_RELEASE_TABLET | Freq: Every day | ORAL | 3 refills | Status: DC

## 2019-07-26 MED ORDER — PHENAZOPYRIDINE HCL 200 MG PO TABS: 200.0000 mg | ORAL_TABLET | Freq: Two times a day (BID) | ORAL | 11 refills | Status: AC | PRN

## 2019-07-26 NOTE — Assessment & Plan Note (Signed)
Having several urinary symptoms Dip here does not look like there is an infection.  Will send for culture to confirm In reviewing urology notes it looks like she does have a cystocele and that is most likely what is causing her symptoms Advised her to discuss with GYN at her upcoming appointment and can see urology if needed

## 2019-07-26 NOTE — Assessment & Plan Note (Signed)
Chronic Blood pressure high here today, but she feels it is better controlled at home-often 120s/70s Advised her to monitor closely over the next couple of weeks Stressed getting back into exercise and working on weight loss Continue Bystolic 20 mg daily CMP

## 2019-07-26 NOTE — Assessment & Plan Note (Signed)
Chronic GERD controlled Continue daily medication-pantoprazole 40 mg daily  

## 2019-07-26 NOTE — Assessment & Plan Note (Signed)
Chronic Taking vitamin D daily We will check level 

## 2019-07-26 NOTE — Assessment & Plan Note (Signed)
Chronic DEXA up-to-date Taking calcium and vitamin D Stressed regular exercise

## 2019-07-26 NOTE — Assessment & Plan Note (Signed)
Chronic Check a1c Low sugar / carb diet Stressed regular exercise  

## 2019-07-27 LAB — URINE CULTURE: Result:: NO GROWTH

## 2019-07-31 ENCOUNTER — Telehealth: Payer: Self-pay

## 2019-07-31 MED ORDER — BYSTOLIC 20 MG PO TABS: 0.5000 | ORAL_TABLET | Freq: Every day | ORAL | 1 refills | Status: DC

## 2019-07-31 NOTE — Telephone Encounter (Signed)
Pt aware of results 

## 2019-07-31 NOTE — Telephone Encounter (Signed)
New message    The patient calling for test results.   

## 2019-07-31 NOTE — Addendum Note (Signed)
Addended by: Mercer Pod E on: 07/31/2019 04:35 PM   Modules accepted: Orders

## 2019-08-02 ENCOUNTER — Other Ambulatory Visit: Payer: Self-pay | Admitting: Internal Medicine

## 2019-10-10 ENCOUNTER — Other Ambulatory Visit: Payer: Self-pay | Admitting: Internal Medicine

## 2019-10-10 MED ORDER — LORAZEPAM 1 MG PO TABS: 1.0000 mg | ORAL_TABLET | Freq: Three times a day (TID) | ORAL | 5 refills | Status: DC | PRN

## 2019-12-18 DIAGNOSIS — Z01419 Encounter for gynecological examination (general) (routine) without abnormal findings: Secondary | ICD-10-CM | POA: Diagnosis not present

## 2019-12-18 DIAGNOSIS — Z124 Encounter for screening for malignant neoplasm of cervix: Secondary | ICD-10-CM | POA: Diagnosis not present

## 2019-12-18 DIAGNOSIS — Z6835 Body mass index (BMI) 35.0-35.9, adult: Secondary | ICD-10-CM | POA: Diagnosis not present

## 2019-12-18 DIAGNOSIS — Z1151 Encounter for screening for human papillomavirus (HPV): Secondary | ICD-10-CM | POA: Diagnosis not present

## 2019-12-18 DIAGNOSIS — Z1231 Encounter for screening mammogram for malignant neoplasm of breast: Secondary | ICD-10-CM | POA: Diagnosis not present

## 2019-12-18 DIAGNOSIS — Z1389 Encounter for screening for other disorder: Secondary | ICD-10-CM | POA: Diagnosis not present

## 2019-12-18 LAB — HM MAMMOGRAPHY

## 2019-12-18 LAB — HM PAP SMEAR: HM Pap smear: NEGATIVE

## 2019-12-23 ENCOUNTER — Telehealth: Payer: Self-pay | Admitting: Emergency Medicine

## 2019-12-23 NOTE — Telephone Encounter (Signed)
Error

## 2019-12-31 ENCOUNTER — Other Ambulatory Visit: Payer: Self-pay | Admitting: Internal Medicine

## 2020-01-02 ENCOUNTER — Encounter: Payer: Self-pay | Admitting: Internal Medicine

## 2020-01-02 NOTE — Progress Notes (Signed)
Outside notes received. Information abstracted. Notes sent to scan.  

## 2020-01-16 ENCOUNTER — Other Ambulatory Visit: Payer: Self-pay | Admitting: Internal Medicine

## 2020-01-30 ENCOUNTER — Telehealth: Payer: Self-pay | Admitting: Internal Medicine

## 2020-01-30 MED ORDER — CLONAZEPAM 0.5 MG PO TABS: ORAL_TABLET | ORAL | 5 refills | Status: DC

## 2020-01-30 NOTE — Telephone Encounter (Signed)
Thank you :)

## 2020-01-30 NOTE — Telephone Encounter (Signed)
Clonazepam refilled 

## 2020-02-05 ENCOUNTER — Ambulatory Visit: Payer: 59 | Admitting: Internal Medicine

## 2020-02-12 ENCOUNTER — Encounter: Payer: Self-pay | Admitting: Internal Medicine

## 2020-02-12 NOTE — Progress Notes (Signed)
Outside notes received. Information abstracted. Notes sent to scan.  

## 2020-02-17 NOTE — Progress Notes (Signed)
Subjective:    Patient ID: Carrie Dalton, female    DOB: 10-15-1955, 64 y.o.   MRN: [id number]  HPI The patient is here for an acute visit.  Headaches - started after covid vaccine #2 in Feb.  She has a headache every day.  She initially thought it was related to being on a computer all day, but has retired and the headaches have persisted.  She also thought it could have been her vision and she did see her eye doctor and got a new prescription and that has not helped.  She gets a headache daily and she takes ibuprofen daily - 800 mg twice daily. It helps but the headache comes back.  The headache starts in her frontal region and then ends up covering much the whole head she thinks.  She will feel a soreness to touch in the back of her head, base of her neck.  She denies any chronic neck pain.  She denies getting headaches on a regular basis.  Has no history of migraines.   She is taking all of her chronic medications daily.  She is curious to get follow-up blood work to see if her cholesterol and sugar have improved.     Medications and allergies reviewed with patient and updated if appropriate.  Patient Active Problem List   Diagnosis Date Noted  . Cough 07/19/2018  . Vitamin D deficiency 07/18/2018  . Abnormal EKG 07/07/2017  . Closed fracture of right distal fibula 05/09/2017  . Hyperglycemia 03/29/2017  . Atrophic vaginitis 06/29/2016  . Essential hypertension, benign 03/11/2016  . GERD (gastroesophageal reflux disease) 03/11/2016  . Dysuria 03/11/2016  . Generalized anxiety disorder 02/17/2015  . Obesity 02/17/2015  . Osteopenia 11/26/2009  . Asthma, mild persistent 11/05/2009  . HYPERCHOLESTEROLEMIA, BORDERLINE 08/28/2008  . UNSPECIFIED PAROXYSMAL TACHYCARDIA 08/28/2008  . History of TB skin testing 08/28/2008  . DEGENERATIVE JOINT DISEASE 09/06/2007  . Chronic insomnia 09/06/2007    Current Outpatient Medications on File Prior to Visit  Medication Sig Dispense  Refill  . albuterol (VENTOLIN HFA) 108 (90 Base) MCG/ACT inhaler INHALE 2 PUFFS INTO THE LUNGS EVERY 6 HOURS AS NEEDED FOR WHEEZING OR SHORTNESS OF BREATH. 18 g 12  . aspirin 81 MG tablet Take 81 mg by mouth daily.      . budesonide-formoterol (SYMBICORT) 80-4.5 MCG/ACT inhaler Inhale 2 puffs into the lungs 2 (two) times daily. 1 Inhaler 12  . BYSTOLIC 20 MG TABS TAKE 1/2 TABLET (10 MG TOTAL) BY MOUTH DAILY 45 tablet 1  . cholecalciferol (VITAMIN D) 1000 UNITS tablet Take 1,000 Units by mouth daily.      . clonazePAM (KLONOPIN) 0.5 MG tablet TAKE 1 TO 3 TABLETS BY MOUTH DAILY AT BEDTIME AS NEEDED 90 tablet 5  . LORazepam (ATIVAN) 1 MG tablet Take 1 tablet (1 mg total) by mouth every 8 (eight) hours as needed. for anxiety 90 tablet 5  . phenazopyridine (PYRIDIUM) 200 MG tablet Take 1 tablet (200 mg total) by mouth 2 (two) times daily as needed. 60 tablet 11  . triazolam (HALCION) 0.25 MG tablet TAKE 1 TABLET BY MOUTH NIGHTLY AT BEDTIME AS NEEDED 30 tablet 5  . cyclobenzaprine (FLEXERIL) 10 MG tablet TAKE 1 TABLET (10 MG TOTAL) 3 (THREE) TIMES DAILY AS NEEDED BY MOUTH. (Patient not taking: Reported on 02/18/2020) 90 tablet 0   No current facility-administered medications on file prior to visit.    Past Medical History:  Diagnosis Date  . ASTHMA, UNSPECIFIED, UNSPECIFIED  STATUS   . Closed fracture of right distal fibula 05/09/2017  . DEGENERATIVE JOINT DISEASE   . Family history of adverse reaction to anesthesia    ponv  . HYPERCHOLESTEROLEMIA, BORDERLINE   . NECK PAIN 11/05/2009  . OSTEOPENIA   . POSITIVE PPD   . SLEEP DISORDER, HX OF   . UNSPECIFIED PAROXYSMAL TACHYCARDIA     Past Surgical History:  Procedure Laterality Date  . CESAREAN SECTION  1984  . DILATION AND CURETTAGE OF UTERUS     x 3  . ORIF FIBULA FRACTURE Right 05/09/2017   Procedure: OPEN REDUCTION INTERNAL FIXATION (ORIF) RIGHT DISTAL FIBULA FRACTURE;  Surgeon: Teryl Lucy, MD;  Location: [facility];   Service: Orthopedics;  Laterality: Right;    Social History   Socioeconomic History  . Marital status: Married    Spouse name: Not on file  . Number of children: 3  . Years of education: Not on file  . Highest education level: Not on file  Occupational History  . Occupation: PATIENT CARE COORDIN    Employer: [employer]  Tobacco Use  . Smoking status: Former Smoker    Packs/day: 0.50    Types: Cigarettes    Quit date: 05/23/2000    Years since quitting: 19.7  . Smokeless tobacco: Never Used  Substance and Sexual Activity  . Alcohol use: No  . Drug use: No  . Sexual activity: Not on file  Other Topics Concern  . Not on file  Social History Narrative  . Not on file   Social Determinants of Health   Financial Resource Strain:   . Difficulty of Paying Living Expenses: Not on file  Food Insecurity:   . Worried About Programme researcher, broadcasting/film/video in the Last Year: Not on file  . Ran Out of Food in the Last Year: Not on file  Transportation Needs:   . Lack of Transportation (Medical): Not on file  . Lack of Transportation (Non-Medical): Not on file  Physical Activity:   . Days of Exercise per Week: Not on file  . Minutes of Exercise per Session: Not on file  Stress:   . Feeling of Stress : Not on file  Social Connections:   . Frequency of Communication with Friends and Family: Not on file  . Frequency of Social Gatherings with Friends and Family: Not on file  . Attends Religious Services: Not on file  . Active Member of Clubs or Organizations: Not on file  . Attends Banker Meetings: Not on file  . Marital Status: Not on file    Family History  Problem Relation Age of Onset  . Diabetes Father   . Hypertension Father   . Stroke Mother   . Atrial fibrillation Mother   . Prostate cancer Maternal Grandfather   . Atrial fibrillation Maternal Grandmother   . Heart failure Maternal Grandmother        CHF  . Hypertension Maternal Grandmother   . Heart  disease Daughter        heart transplant  . Pancreatitis Daughter     Review of Systems  Eyes: Negative for visual disturbance.  Gastrointestinal: Negative for nausea.  Neurological: Positive for headaches. Negative for dizziness, light-headedness and numbness.       Objective:   Vitals:   02/18/20 1322  BP: (!) 150/80  Pulse: 68  Temp: 98.2 F (36.8 C)   BP Readings from Last 3 Encounters:  02/18/20 (!) 150/80  07/26/19 (!) 152/82  02/28/19 (!) 136/96   Wt Readings from Last 3 Encounters:  02/18/20 190 lb 3.2 oz (86.3 kg)  07/26/19 200 lb (90.7 kg)  02/28/19 195 lb 9.6 oz (88.7 kg)   Body mass index is 34.79 kg/m.   Physical Exam Constitutional:      General: She is not in acute distress.    Appearance: She is well-developed. She is not ill-appearing.  HENT:     Head: Normocephalic and atraumatic.  Skin:    General: Skin is warm and dry.  Neurological:     Mental Status: She is alert.     Cranial Nerves: No cranial nerve deficit (Grossly normal), dysarthria or facial asymmetry.     Sensory: No sensory deficit.     Motor: No weakness.     Gait: Gait normal.  Psychiatric:        Mood and Affect: Mood normal.            Assessment & Plan:    See Problem List for Assessment and Plan of chronic medical problems.    This visit occurred during the SARS-CoV-2 public health emergency.  Safety protocols were in place, including screening questions prior to the visit, additional usage of staff PPE, and extensive cleaning of exam room while observing appropriate contact time as indicated for disinfecting solutions.

## 2020-02-18 ENCOUNTER — Other Ambulatory Visit: Payer: Self-pay

## 2020-02-18 ENCOUNTER — Encounter: Payer: Self-pay | Admitting: Internal Medicine

## 2020-02-18 ENCOUNTER — Ambulatory Visit (INDEPENDENT_AMBULATORY_CARE_PROVIDER_SITE_OTHER): Payer: Medicare Other | Admitting: Internal Medicine

## 2020-02-18 VITALS — BP 150/80 | HR 68 | Temp 98.2°F | Ht 62.0 in | Wt 190.2 lb

## 2020-02-18 DIAGNOSIS — I10 Essential (primary) hypertension: Secondary | ICD-10-CM

## 2020-02-18 DIAGNOSIS — G4440 Drug-induced headache, not elsewhere classified, not intractable: Secondary | ICD-10-CM

## 2020-02-18 DIAGNOSIS — E7849 Other hyperlipidemia: Secondary | ICD-10-CM

## 2020-02-18 DIAGNOSIS — K219 Gastro-esophageal reflux disease without esophagitis: Secondary | ICD-10-CM

## 2020-02-18 DIAGNOSIS — R739 Hyperglycemia, unspecified: Secondary | ICD-10-CM | POA: Diagnosis not present

## 2020-02-18 DIAGNOSIS — E559 Vitamin D deficiency, unspecified: Secondary | ICD-10-CM

## 2020-02-18 MED ORDER — PREDNISONE 10 MG PO TABS: ORAL_TABLET | ORAL | 0 refills | Status: DC

## 2020-02-18 MED ORDER — PANTOPRAZOLE SODIUM 40 MG PO TBEC: 40.0000 mg | DELAYED_RELEASE_TABLET | Freq: Every day | ORAL | 1 refills | Status: DC

## 2020-02-18 NOTE — Assessment & Plan Note (Addendum)
Chronic Blood pressure on the high side here and has been high on a couple other occasions Suggested that she check it regularly She is working on lifestyle changes, but if her blood pressure is persistently elevated we will need to adjust medication She will update me if her blood pressure is elevated persistently Continue Bystolic 20 mg daily

## 2020-02-18 NOTE — Assessment & Plan Note (Addendum)
Chronic Check A1c 

## 2020-02-18 NOTE — Assessment & Plan Note (Signed)
Chronic Has been taking vitamin D daily Recheck level today

## 2020-02-18 NOTE — Patient Instructions (Signed)
  Blood work was ordered.     Medications reviewed and updated.  Changes include :   Prednisone taper.  Avoid all tylenol and advil.   Your prescription(s) have been submitted to your pharmacy. Please take as directed and contact our office if you believe you are having problem(s) with the medication(s).     Please call if there is no improvement in your symptoms.

## 2020-02-18 NOTE — Assessment & Plan Note (Signed)
Chronic GERD controlled Continue pantoprazole 40 mg daily 

## 2020-02-18 NOTE — Assessment & Plan Note (Signed)
Attic Diet controlled Check lipid panel, CMP today

## 2020-02-18 NOTE — Assessment & Plan Note (Signed)
Acute Headaches she is experiencing is consistent with rebound headaches Advised that she cannot take any Tylenol or ibuprofen Start prednisone taper She will let me know if her headaches do not improve

## 2020-02-19 LAB — COMPLETE METABOLIC PANEL WITH GFR
AG Ratio: 2.2 (calc) (ref 1.0–2.5)
ALT: 13 U/L (ref 6–29)
AST: 16 U/L (ref 10–35)
Albumin: 4.2 g/dL (ref 3.6–5.1)
Alkaline phosphatase (APISO): 65 U/L (ref 37–153)
BUN: 8 mg/dL (ref 7–25)
CO2: 27 mmol/L (ref 20–32)
Calcium: 9.5 mg/dL (ref 8.6–10.4)
Chloride: 107 mmol/L (ref 98–110)
Creat: 0.73 mg/dL (ref 0.50–0.99)
GFR, Est African American: 100 mL/min/{1.73_m2} (ref >=60)
GFR, Est Non African American: 86 mL/min/{1.73_m2} (ref >=60)
Globulin: 1.9 g/dL (calc) (ref 1.9–3.7)
Glucose, Bld: 88 mg/dL (ref 65–99)
Potassium: 4.6 mmol/L (ref 3.5–5.3)
Sodium: 141 mmol/L (ref 135–146)
Total Bilirubin: 0.5 mg/dL (ref 0.2–1.2)
Total Protein: 6.1 g/dL (ref 6.1–8.1)

## 2020-02-19 LAB — LIPID PANEL
Cholesterol: 193 mg/dL (ref <200)
HDL: 42 mg/dL — ABNORMAL LOW (ref >=50)
LDL Cholesterol (Calc): 126 mg/dL (calc) — ABNORMAL HIGH
Non-HDL Cholesterol (Calc): 151 mg/dL (calc) — ABNORMAL HIGH (ref <130)
Total CHOL/HDL Ratio: 4.6 (calc) (ref <5.0)
Triglycerides: 141 mg/dL (ref <150)

## 2020-02-19 LAB — VITAMIN D 25 HYDROXY (VIT D DEFICIENCY, FRACTURES): Vit D, 25-Hydroxy: 31 ng/mL (ref 30–100)

## 2020-02-19 LAB — HEMOGLOBIN A1C
Hgb A1c MFr Bld: 5.4 % of total Hgb (ref <5.7)
Mean Plasma Glucose: 108 (calc)
eAG (mmol/L): 6 (calc)

## 2020-02-20 ENCOUNTER — Telehealth: Payer: Self-pay | Admitting: Internal Medicine

## 2020-02-20 DIAGNOSIS — E7849 Other hyperlipidemia: Secondary | ICD-10-CM

## 2020-02-20 NOTE — Telephone Encounter (Signed)
Patient states she currently takes 1000mg  of Vitamin D And is agreeable to taking low does cholesterol medication Please send to [facility] [address] [place] [address] Phone:  [phone]  Fax:  [phone]

## 2020-02-21 MED ORDER — ROSUVASTATIN CALCIUM 5 MG PO TABS: 5.0000 mg | ORAL_TABLET | Freq: Every day | ORAL | 3 refills | Status: DC

## 2020-02-21 NOTE — Telephone Encounter (Signed)
Called pt there was no answer LMOM w/MD response../lmb 

## 2020-02-21 NOTE — Telephone Encounter (Signed)
crestor sent to [facility].   Lets recheck her cholesterol and liver tests in 6-8 weeks -- ordered for elam

## 2020-06-27 ENCOUNTER — Telehealth: Payer: Self-pay | Admitting: Internal Medicine

## 2020-06-27 MED ORDER — AZITHROMYCIN 250 MG PO TABS: ORAL_TABLET | ORAL | 0 refills | Status: DC

## 2020-06-27 NOTE — Telephone Encounter (Signed)
Text message- bronchitis x 1 week, green sputum. Covid test negative. Zpak sent to her [facility].

## 2020-07-24 ENCOUNTER — Telehealth: Payer: Self-pay | Admitting: Emergency Medicine

## 2020-07-24 ENCOUNTER — Other Ambulatory Visit: Payer: Self-pay

## 2020-07-24 MED ORDER — NEBIVOLOL HCL 20 MG PO TABS: ORAL_TABLET | ORAL | 1 refills | Status: DC

## 2020-07-24 NOTE — Telephone Encounter (Signed)
Faxed in today. 

## 2020-07-24 NOTE — Telephone Encounter (Signed)
Called patient to remind her of her appt on Monday 07/27/2020. Pt asked if she can get a refill on her blood pressure medication. I let patient know Dr Lawerance Bach is out of the office this afternoon. Thanks.

## 2020-07-26 ENCOUNTER — Encounter: Payer: Self-pay | Admitting: Internal Medicine

## 2020-07-26 NOTE — Patient Instructions (Addendum)
  Blood work was ordered.     prevnar 20 immunization administered today.     Medications changes include :   omnicef for your infection     Please followup in 6 months

## 2020-07-26 NOTE — Progress Notes (Signed)
Subjective:    Patient ID: Carrie Dalton, female    DOB: 1955-09-14, 65 y.o.   MRN: [id number]   This visit occurred during the SARS-CoV-2 public health emergency.  Safety protocols were in place, including screening questions prior to the visit, additional usage of staff PPE, and extensive cleaning of exam room while observing appropriate contact time as indicated for disinfecting solutions.    HPI She is here for follow-up of her chronic medical conditions.  She had bronchitis recently and was treated a zpak.  She still has a cough.  She denies fever.  She uses her albuterol rarely.  She does use her symbicort.  She denies SOB.  She has occasional wheeze.  She is still coughing up sputum that is yellow in color-it was green initially.  She has not started the crestor - concerned with side effects.   She is active - has her grandchildren a few times a week.  She does some walking.     BP at home 120's/62 -- wrist cuff.    Medications and allergies reviewed with patient and updated if appropriate.  Patient Active Problem List   Diagnosis Date Noted  . Cough 07/19/2018  . Vitamin D deficiency 07/18/2018  . Abnormal EKG 07/07/2017  . Closed fracture of right distal fibula 05/09/2017  . Hyperglycemia 03/29/2017  . Atrophic vaginitis 06/29/2016  . Essential hypertension, benign 03/11/2016  . GERD (gastroesophageal reflux disease) 03/11/2016  . Generalized anxiety disorder 02/17/2015  . Obesity 02/17/2015  . Osteopenia 11/26/2009  . Asthma, mild persistent 11/05/2009  . Other hyperlipidemia 08/28/2008  . UNSPECIFIED PAROXYSMAL TACHYCARDIA 08/28/2008  . History of TB skin testing 08/28/2008  . DEGENERATIVE JOINT DISEASE 09/06/2007  . Chronic insomnia 09/06/2007    Current Outpatient Medications on File Prior to Visit  Medication Sig Dispense Refill  . albuterol (VENTOLIN HFA) 108 (90 Base) MCG/ACT inhaler INHALE 2 PUFFS INTO THE LUNGS EVERY 6 HOURS AS NEEDED FOR WHEEZING  OR SHORTNESS OF BREATH. 18 g 12  . aspirin 81 MG tablet Take 81 mg by mouth daily.    . budesonide-formoterol (SYMBICORT) 80-4.5 MCG/ACT inhaler Inhale 2 puffs into the lungs 2 (two) times daily. 1 Inhaler 12  . cholecalciferol (VITAMIN D) 1000 UNITS tablet Take 1,000 Units by mouth daily.    . clonazePAM (KLONOPIN) 0.5 MG tablet TAKE 1 TO 3 TABLETS BY MOUTH DAILY AT BEDTIME AS NEEDED 90 tablet 5  . Nebivolol HCl (BYSTOLIC) 20 MG TABS TAKE 1/2 TABLET (10 MG TOTAL) BY MOUTH DAILY 45 tablet 1  . pantoprazole (PROTONIX) 40 MG tablet Take 1 tablet (40 mg total) by mouth daily. 90 tablet 1  . phenazopyridine (PYRIDIUM) 200 MG tablet Take 1 tablet (200 mg total) by mouth 2 (two) times daily as needed. 60 tablet 11  . triamcinolone (KENALOG) 0.1 % triamcinolone acetonide 0.1 % topical cream     No current facility-administered medications on file prior to visit.    Past Medical History:  Diagnosis Date  . ASTHMA, UNSPECIFIED, UNSPECIFIED STATUS   . Closed fracture of right distal fibula 05/09/2017  . DEGENERATIVE JOINT DISEASE   . Family history of adverse reaction to anesthesia    ponv  . HYPERCHOLESTEROLEMIA, BORDERLINE   . NECK PAIN 11/05/2009  . OSTEOPENIA   . POSITIVE PPD   . SLEEP DISORDER, HX OF   . UNSPECIFIED PAROXYSMAL TACHYCARDIA     Past Surgical History:  Procedure Laterality Date  . CESAREAN SECTION  1984  .  DILATION AND CURETTAGE OF UTERUS     x 3  . ORIF FIBULA FRACTURE Right 05/09/2017   Procedure: OPEN REDUCTION INTERNAL FIXATION (ORIF) RIGHT DISTAL FIBULA FRACTURE;  Surgeon: Teryl Lucy, MD;  Location: [facility];  Service: Orthopedics;  Laterality: Right;    Social History   Socioeconomic History  . Marital status: Married    Spouse name: Not on file  . Number of children: 3  . Years of education: Not on file  . Highest education level: Not on file  Occupational History  . Occupation: PATIENT CARE COORDIN    Employer: [employer]   Tobacco Use  . Smoking status: Former Smoker    Packs/day: 0.50    Types: Cigarettes    Quit date: 05/23/2000    Years since quitting: 20.1  . Smokeless tobacco: Never Used  Substance and Sexual Activity  . Alcohol use: No  . Drug use: No  . Sexual activity: Not on file  Other Topics Concern  . Not on file  Social History Narrative  . Not on file   Social Determinants of Health   Financial Resource Strain: Not on file  Food Insecurity: Not on file  Transportation Needs: Not on file  Physical Activity: Not on file  Stress: Not on file  Social Connections: Not on file    Family History  Problem Relation Age of Onset  . Diabetes Father   . Hypertension Father   . Stroke Mother   . Atrial fibrillation Mother   . Prostate cancer Maternal Grandfather   . Atrial fibrillation Maternal Grandmother   . Heart failure Maternal Grandmother        CHF  . Hypertension Maternal Grandmother   . Heart disease Daughter        heart transplant  . Pancreatitis Daughter     Review of Systems  Constitutional: Negative for fever.  HENT: Negative for congestion, ear pain, sinus pain and sore throat.   Respiratory: Positive for cough (residual from bronchitis) and wheezing (occ). Negative for shortness of breath.   Cardiovascular: Negative for chest pain, palpitations and leg swelling.  Musculoskeletal: Positive for arthralgias.  Neurological: Positive for headaches (occ). Negative for light-headedness.       Objective:   Vitals:   07/27/20 1039  BP: (!) 144/82  Pulse: 77  Temp: 98.1 F (36.7 C)  SpO2: 90%   Filed Weights   07/27/20 1039  Weight: 190 lb (86.2 kg)   Body mass index is 34.75 kg/m.  BP Readings from Last 3 Encounters:  07/27/20 (!) 144/82  02/18/20 (!) 150/80  07/26/19 (!) 152/82    Wt Readings from Last 3 Encounters:  07/27/20 190 lb (86.2 kg)  02/18/20 190 lb 3.2 oz (86.3 kg)  07/26/19 200 lb (90.7 kg)    Depression screen [facility] 2/9 07/27/2020  07/26/2019 07/19/2018 03/29/2017  Decreased Interest 0 0 0 0  Down, Depressed, Hopeless 0 0 0 0  PHQ - 2 Score 0 0 0 0  Altered sleeping 1 - - -  Tired, decreased energy 0 - - -  Change in appetite 0 - - -  Feeling bad or failure about yourself  0 - - -  Trouble concentrating 0 - - -  Moving slowly or fidgety/restless 0 - - -  Suicidal thoughts 0 - - -  PHQ-9 Score 1 - - -  Difficult doing work/chores Not difficult at all - - -    GAD 7 : Generalized Anxiety Score 07/27/2020  Nervous, Anxious, on Edge 0  Control/stop worrying 0  Worry too much - different things 0  Trouble relaxing 0  Restless 0  Easily annoyed or irritable 0  Afraid - awful might happen 0  Total GAD 7 Score 0       Physical Exam Constitutional: She appears well-developed and well-nourished. No distress.  HENT:  Head: Normocephalic and atraumatic.  Right Ear: External ear normal. Normal ear canal and TM Left Ear: External ear normal.  Normal ear canal and TM Mouth/Throat: Oropharynx is clear and moist.  Eyes: Conjunctivae and EOM are normal.  Neck: Neck supple. No tracheal deviation present. No thyromegaly present.  No carotid bruit  Cardiovascular: Normal rate, regular rhythm and normal heart sounds.   No murmur heard.  No edema. Pulmonary/Chest: Effort normal and breath sounds normal. No respiratory distress. She has no wheezes. She has no rales.  Breast: deferred   Abdominal: Soft. She exhibits no distension. There is no tenderness.  Lymphadenopathy: She has no cervical adenopathy.  Skin: Skin is warm and dry. She is not diaphoretic.  Psychiatric: She has a normal mood and affect. Her behavior is normal.    The 10-year ASCVD risk score Denman George DC Montez Hageman., et al., 2013) is: 10.3%   Values used to calculate the score:     Age: 52 years     Sex: Female     Is Non-Hispanic African American: No     Diabetic: No     Tobacco smoker: No     Systolic Blood Pressure: 144 mmHg     Is BP treated: Yes     HDL  Cholesterol: 42 mg/dL     Total Cholesterol: 193 mg/dL      Assessment & Plan:       See Problem List for Assessment and Plan of chronic medical problems.

## 2020-07-27 ENCOUNTER — Other Ambulatory Visit: Payer: Self-pay

## 2020-07-27 ENCOUNTER — Ambulatory Visit (INDEPENDENT_AMBULATORY_CARE_PROVIDER_SITE_OTHER): Payer: Medicare Other | Admitting: Internal Medicine

## 2020-07-27 VITALS — BP 144/82 | HR 77 | Temp 98.1°F | Ht 62.0 in | Wt 190.0 lb

## 2020-07-27 DIAGNOSIS — J45909 Unspecified asthma, uncomplicated: Secondary | ICD-10-CM | POA: Insufficient documentation

## 2020-07-27 DIAGNOSIS — R739 Hyperglycemia, unspecified: Secondary | ICD-10-CM

## 2020-07-27 DIAGNOSIS — Z6834 Body mass index (BMI) 34.0-34.9, adult: Secondary | ICD-10-CM

## 2020-07-27 DIAGNOSIS — M85851 Other specified disorders of bone density and structure, right thigh: Secondary | ICD-10-CM

## 2020-07-27 DIAGNOSIS — E7849 Other hyperlipidemia: Secondary | ICD-10-CM

## 2020-07-27 DIAGNOSIS — I10 Essential (primary) hypertension: Secondary | ICD-10-CM

## 2020-07-27 DIAGNOSIS — K219 Gastro-esophageal reflux disease without esophagitis: Secondary | ICD-10-CM

## 2020-07-27 DIAGNOSIS — Z23 Encounter for immunization: Secondary | ICD-10-CM

## 2020-07-27 DIAGNOSIS — F411 Generalized anxiety disorder: Secondary | ICD-10-CM | POA: Diagnosis not present

## 2020-07-27 DIAGNOSIS — E559 Vitamin D deficiency, unspecified: Secondary | ICD-10-CM

## 2020-07-27 DIAGNOSIS — J209 Acute bronchitis, unspecified: Secondary | ICD-10-CM

## 2020-07-27 DIAGNOSIS — F5104 Psychophysiologic insomnia: Secondary | ICD-10-CM

## 2020-07-27 DIAGNOSIS — E6609 Other obesity due to excess calories: Secondary | ICD-10-CM

## 2020-07-27 LAB — VITAMIN D 25 HYDROXY (VIT D DEFICIENCY, FRACTURES): VITD: 30.57 ng/mL (ref 30.00–100.00)

## 2020-07-27 LAB — CBC WITH DIFFERENTIAL/PLATELET
Basophils Absolute: 0 10*3/uL (ref 0.0–0.1)
Basophils Relative: 0.5 % (ref 0.0–3.0)
Eosinophils Absolute: 1 10*3/uL — ABNORMAL HIGH (ref 0.0–0.7)
Eosinophils Relative: 11.4 % — ABNORMAL HIGH (ref 0.0–5.0)
HCT: 42.1 % (ref 36.0–46.0)
Hemoglobin: 14.2 g/dL (ref 12.0–15.0)
Lymphocytes Relative: 22.4 % (ref 12.0–46.0)
Lymphs Abs: 2 10*3/uL (ref 0.7–4.0)
MCHC: 33.7 g/dL (ref 30.0–36.0)
MCV: 86.6 fl (ref 78.0–100.0)
Monocytes Absolute: 0.5 10*3/uL (ref 0.1–1.0)
Monocytes Relative: 6.2 % (ref 3.0–12.0)
Neutro Abs: 5.2 10*3/uL (ref 1.4–7.7)
Neutrophils Relative %: 59.5 % (ref 43.0–77.0)
Platelets: 287 10*3/uL (ref 150.0–400.0)
RBC: 4.86 Mil/uL (ref 3.87–5.11)
RDW: 12.8 % (ref 11.5–15.5)
WBC: 8.7 10*3/uL (ref 4.0–10.5)

## 2020-07-27 LAB — LIPID PANEL
Cholesterol: 195 mg/dL (ref 0–200)
HDL: 45.9 mg/dL (ref >39.00)
LDL Cholesterol: 119 mg/dL — ABNORMAL HIGH (ref 0–99)
NonHDL: 149.34
Total CHOL/HDL Ratio: 4
Triglycerides: 152 mg/dL — ABNORMAL HIGH (ref 0.0–149.0)
VLDL: 30.4 mg/dL (ref 0.0–40.0)

## 2020-07-27 LAB — COMPREHENSIVE METABOLIC PANEL
ALT: 11 U/L (ref 0–35)
AST: 15 U/L (ref 0–37)
Albumin: 3.9 g/dL (ref 3.5–5.2)
Alkaline Phosphatase: 71 U/L (ref 39–117)
BUN: 7 mg/dL (ref 6–23)
CO2: 29 mEq/L (ref 19–32)
Calcium: 9.4 mg/dL (ref 8.4–10.5)
Chloride: 104 mEq/L (ref 96–112)
Creatinine, Ser: 0.73 mg/dL (ref 0.40–1.20)
GFR: 86.23 mL/min (ref >60.00)
Glucose, Bld: 107 mg/dL — ABNORMAL HIGH (ref 70–99)
Potassium: 4.8 mEq/L (ref 3.5–5.1)
Sodium: 140 mEq/L (ref 135–145)
Total Bilirubin: 0.5 mg/dL (ref 0.2–1.2)
Total Protein: 6.4 g/dL (ref 6.0–8.3)

## 2020-07-27 LAB — TSH: TSH: 1.62 u[IU]/mL (ref 0.35–4.50)

## 2020-07-27 LAB — HEMOGLOBIN A1C: Hgb A1c MFr Bld: 5.8 % (ref 4.6–6.5)

## 2020-07-27 MED ORDER — CEFDINIR 300 MG PO CAPS: 300.0000 mg | ORAL_CAPSULE | Freq: Two times a day (BID) | ORAL | 0 refills | Status: DC

## 2020-07-27 NOTE — Assessment & Plan Note (Signed)
Subacute Completed Z-Pak and is still having symptoms Sounds like her infection was not successfully treated Start Omnicef 300 mg twice daily x7 days

## 2020-07-27 NOTE — Assessment & Plan Note (Signed)
Chronic DEXA due-ordered Encourage regular exercise Continue calcium and vitamin D Check vitamin D level

## 2020-07-27 NOTE — Assessment & Plan Note (Signed)
Chronic A1c today

## 2020-07-27 NOTE — Assessment & Plan Note (Signed)
Screened for depression using the PHQ 9 scale.  No evidence of depression.

## 2020-07-27 NOTE — Addendum Note (Signed)
Addended by: Karma Ganja on: 07/27/2020 03:12 PM   Modules accepted: Orders

## 2020-07-27 NOTE — Assessment & Plan Note (Signed)
Chronic GERD controlled Continue pantoprazole 40 mg daily 

## 2020-07-27 NOTE — Assessment & Plan Note (Signed)
Chronic Elevated here today, but she states it is much lower at home She does use a wrist cuff so discussed that this may or may not be accurate Advised screening her cough to her next doctor's appointment to see if that is accurate No change in medications today Continue Bystolic 10 mg daily CMP

## 2020-07-27 NOTE — Assessment & Plan Note (Signed)
Chronic Taking vitamin D daily Check vitamin D level  

## 2020-07-27 NOTE — Assessment & Plan Note (Signed)
Chronic Encouraged regular exercise, healthy diet and decreased portions

## 2020-07-27 NOTE — Assessment & Plan Note (Signed)
Chronic Taking clonazepam nightly

## 2020-07-27 NOTE — Assessment & Plan Note (Signed)
Chronic Elevated ASCVD risk Crestor prescribed at her last visit, but after reading more about the medication she decided not to take it Discussed overall calculated risk and recommendations for statin She would like to try them red yeast rice Encouraged regular activity/exercise and healthy diet Check lipids, CMP, TSH

## 2020-07-31 ENCOUNTER — Encounter: Payer: 59 | Admitting: Internal Medicine

## 2020-08-01 ENCOUNTER — Other Ambulatory Visit: Payer: Self-pay | Admitting: Internal Medicine

## 2020-08-03 NOTE — Telephone Encounter (Signed)
Clonazepam refilled 

## 2020-08-03 NOTE — Telephone Encounter (Signed)
Refill request from pharmacy for patients clonazePAM. Patient was last seen 02/28/2019 and has no upcoming appointments.   Please advise

## 2020-08-05 ENCOUNTER — Ambulatory Visit (INDEPENDENT_AMBULATORY_CARE_PROVIDER_SITE_OTHER)
Admission: RE | Admit: 2020-08-05 | Discharge: 2020-08-05 | Disposition: A | Discharge status: Home / Self Care | Payer: Medicare Other | Source: Ambulatory Visit | Attending: Internal Medicine | Admitting: Internal Medicine

## 2020-08-05 ENCOUNTER — Other Ambulatory Visit: Payer: Self-pay

## 2020-08-05 DIAGNOSIS — M85851 Other specified disorders of bone density and structure, right thigh: Secondary | ICD-10-CM

## 2020-08-07 ENCOUNTER — Inpatient Hospital Stay: Admission: RE | Admit: 2020-08-07 | Payer: Medicare Other | Source: Ambulatory Visit

## 2020-08-09 ENCOUNTER — Encounter: Payer: Self-pay | Admitting: Internal Medicine

## 2020-08-17 ENCOUNTER — Other Ambulatory Visit: Payer: Self-pay | Admitting: Internal Medicine

## 2020-11-20 ENCOUNTER — Other Ambulatory Visit: Payer: Self-pay | Admitting: Internal Medicine

## 2021-01-26 ENCOUNTER — Telehealth: Payer: Self-pay

## 2021-01-26 ENCOUNTER — Other Ambulatory Visit: Payer: Self-pay

## 2021-01-26 MED ORDER — NEBIVOLOL HCL 20 MG PO TABS: ORAL_TABLET | ORAL | 1 refills | Status: DC

## 2021-01-26 NOTE — Telephone Encounter (Signed)
Please advise as the pt has stated she is in need of her BP med refill for Nebivolol HCl (BYSTOLIC) 20 MG TABS .   Pt has an apptmnt with PCP for 02/19/2021 at 11:00am.

## 2021-01-26 NOTE — Telephone Encounter (Signed)
Sent in today 

## 2021-01-29 ENCOUNTER — Ambulatory Visit: Payer: Medicare Other | Admitting: Internal Medicine

## 2021-02-05 ENCOUNTER — Ambulatory Visit: Payer: Medicare Other | Admitting: Internal Medicine

## 2021-02-06 ENCOUNTER — Other Ambulatory Visit: Payer: Self-pay | Admitting: Internal Medicine

## 2021-02-09 NOTE — Telephone Encounter (Signed)
Spoke with patient to let her know that her refill was sent in. Patient asked about an appointment and it looks like she was last seen on 108/2020. Dr. Maple Hudson has no openings until the week of November 14th.   Dr. Maple Hudson are you ok with using your held slot at 11:30 on 02/22/21?

## 2021-02-09 NOTE — Telephone Encounter (Signed)
OK to use held spot as requested

## 2021-02-09 NOTE — Telephone Encounter (Signed)
ATC patient, LMTCB 

## 2021-02-09 NOTE — Telephone Encounter (Signed)
Clonazepam refilled 

## 2021-02-09 NOTE — Telephone Encounter (Signed)
Dr. Maple Hudson, please advise if you are okay refilling pt's med.  Allergies  Allergen Reactions   Codeine    Compazine    Merthiolate [Thimerosal] Dermatitis    Itching, blisters, rash to area applied    Nickel Dermatitis    Blistering of skin    Prochlorperazine Edisylate      Current Outpatient Medications:    albuterol (VENTOLIN HFA) 108 (90 Base) MCG/ACT inhaler, INHALE 2 PUFFS INTO THE LUNGS EVERY 6 HOURS AS NEEDED FOR WHEEZING OR SHORTNESS OF BREATH., Disp: 18 g, Rfl: 12   aspirin 81 MG tablet, Take 81 mg by mouth daily., Disp: , Rfl:    budesonide-formoterol (SYMBICORT) 80-4.5 MCG/ACT inhaler, Inhale 2 puffs into the lungs 2 (two) times daily., Disp: 1 Inhaler, Rfl: 12   cefdinir (OMNICEF) 300 MG capsule, Take 1 capsule (300 mg total) by mouth 2 (two) times daily., Disp: 14 capsule, Rfl: 0   cholecalciferol (VITAMIN D) 1000 UNITS tablet, Take 1,000 Units by mouth daily., Disp: , Rfl:    clonazePAM (KLONOPIN) 0.5 MG tablet, TAKE 1 TO 3 TABLETS BY MOUTH ONCE DAILY AT BEDTIME AS NEEDED, Disp: 90 tablet, Rfl: 5   Nebivolol HCl (BYSTOLIC) 20 MG TABS, TAKE 1/2 TABLET (10 MG TOTAL) BY MOUTH DAILY, Disp: 45 tablet, Rfl: 1   pantoprazole (PROTONIX) 40 MG tablet, Take 1 tablet by mouth once daily, Disp: 90 tablet, Rfl: 0   phenazopyridine (PYRIDIUM) 200 MG tablet, Take 1 tablet (200 mg total) by mouth 2 (two) times daily as needed., Disp: 60 tablet, Rfl: 11   triamcinolone (KENALOG) 0.1 %, triamcinolone acetonide 0.1 % topical cream, Disp: , Rfl:

## 2021-02-10 NOTE — Telephone Encounter (Signed)
Called and spoke with patient to let her know that Dr. Maple Hudson is ok with [place] using held slot for appt. She agreed and patient has been scheduled. Nothing further needed at this time.

## 2021-02-18 NOTE — Patient Instructions (Signed)
  Blood work was ordered.     Medications changes include :     Your prescription(s) have been submitted to your pharmacy. Please take as directed and contact our office if you believe you are having problem(s) with the medication(s).   A referral was ordered for        Someone from their office will call you to schedule an appointment.    Please followup in 6 months  

## 2021-02-18 NOTE — Progress Notes (Signed)
Subjective:    Patient ID: Carrie Dalton, female    DOB: 1955-12-23, 65 y.o.   MRN: [id number]  This visit occurred during the SARS-CoV-2 public health emergency.  Safety protocols were in place, including screening questions prior to the visit, additional usage of staff PPE, and extensive cleaning of exam room while observing appropriate contact time as indicated for disinfecting solutions.     HPI The patient is here for follow up of their chronic medical problems, including htn, gerd, osteopenia, prediabetes    Medications and allergies reviewed with patient and updated if appropriate.  Patient Active Problem List   Diagnosis Date Noted   Bronchitis with asthma, acute 07/27/2020   Cough 07/19/2018   Vitamin D deficiency 07/18/2018   Abnormal EKG 07/07/2017   Closed fracture of right distal fibula 05/09/2017   Hyperglycemia 03/29/2017   Atrophic vaginitis 06/29/2016   Essential hypertension, benign 03/11/2016   GERD (gastroesophageal reflux disease) 03/11/2016   Generalized anxiety disorder 02/17/2015   Obesity 02/17/2015   Osteopenia, high frax 11/26/2009   Asthma, mild persistent 11/05/2009   Other hyperlipidemia 08/28/2008   UNSPECIFIED PAROXYSMAL TACHYCARDIA 08/28/2008   History of TB skin testing 08/28/2008   DEGENERATIVE JOINT DISEASE 09/06/2007   Chronic insomnia 09/06/2007    Current Outpatient Medications on File Prior to Visit  Medication Sig Dispense Refill   albuterol (VENTOLIN HFA) 108 (90 Base) MCG/ACT inhaler INHALE 2 PUFFS INTO THE LUNGS EVERY 6 HOURS AS NEEDED FOR WHEEZING OR SHORTNESS OF BREATH. 18 g 12   aspirin 81 MG tablet Take 81 mg by mouth daily.     budesonide-formoterol (SYMBICORT) 80-4.5 MCG/ACT inhaler Inhale 2 puffs into the lungs 2 (two) times daily. 1 Inhaler 12   cefdinir (OMNICEF) 300 MG capsule Take 1 capsule (300 mg total) by mouth 2 (two) times daily. 14 capsule 0   cholecalciferol (VITAMIN D) 1000 UNITS tablet Take 1,000 Units  by mouth daily.     clonazePAM (KLONOPIN) 0.5 MG tablet TAKE 1 TO 3 TABLETS BY MOUTH ONCE DAILY AT BEDTIME AS NEEDED 90 tablet 5   Nebivolol HCl (BYSTOLIC) 20 MG TABS TAKE 1/2 TABLET (10 MG TOTAL) BY MOUTH DAILY 45 tablet 1   pantoprazole (PROTONIX) 40 MG tablet Take 1 tablet by mouth once daily 90 tablet 0   phenazopyridine (PYRIDIUM) 200 MG tablet Take 1 tablet (200 mg total) by mouth 2 (two) times daily as needed. 60 tablet 11   triamcinolone (KENALOG) 0.1 % triamcinolone acetonide 0.1 % topical cream     No current facility-administered medications on file prior to visit.    Past Medical History:  Diagnosis Date   ASTHMA, UNSPECIFIED, UNSPECIFIED STATUS    Closed fracture of right distal fibula 05/09/2017   DEGENERATIVE JOINT DISEASE    Family history of adverse reaction to anesthesia    ponv   HYPERCHOLESTEROLEMIA, BORDERLINE    NECK PAIN 11/05/2009   OSTEOPENIA    POSITIVE PPD    SLEEP DISORDER, HX OF    UNSPECIFIED PAROXYSMAL TACHYCARDIA     Past Surgical History:  Procedure Laterality Date   CESAREAN SECTION  1984   DILATION AND CURETTAGE OF UTERUS     x 3   ORIF FIBULA FRACTURE Right 05/09/2017   Procedure: OPEN REDUCTION INTERNAL FIXATION (ORIF) RIGHT DISTAL FIBULA FRACTURE;  Surgeon: Teryl Lucy, MD;  Location: [facility];  Service: Orthopedics;  Laterality: Right;    Social History   Socioeconomic History   Marital  status: Married    Spouse name: Not on file   Number of children: 3   Years of education: Not on file   Highest education level: Not on file  Occupational History   Occupation: PATIENT CARE COORDIN    Employer: [employer]  Tobacco Use   Smoking status: Former    Packs/day: 0.50    Types: Cigarettes    Quit date: 05/23/2000    Years since quitting: 20.7   Smokeless tobacco: Never  Substance and Sexual Activity   Alcohol use: No   Drug use: No   Sexual activity: Not on file  Other Topics Concern   Not on file   Social History Narrative   Not on file   Social Determinants of Health   Financial Resource Strain: Not on file  Food Insecurity: Not on file  Transportation Needs: Not on file  Physical Activity: Not on file  Stress: Not on file  Social Connections: Not on file    Family History  Problem Relation Age of Onset   Diabetes Father    Hypertension Father    Stroke Mother    Atrial fibrillation Mother    Prostate cancer Maternal Grandfather    Atrial fibrillation Maternal Grandmother    Heart failure Maternal Grandmother        CHF   Hypertension Maternal Grandmother    Heart disease Daughter        heart transplant   Pancreatitis Daughter     Review of Systems     Objective:  There were no vitals filed for this visit. BP Readings from Last 3 Encounters:  07/27/20 (!) 144/82  02/18/20 (!) 150/80  07/26/19 (!) 152/82   Wt Readings from Last 3 Encounters:  07/27/20 190 lb (86.2 kg)  02/18/20 190 lb 3.2 oz (86.3 kg)  07/26/19 200 lb (90.7 kg)   There is no height or weight on file to calculate BMI.   Physical Exam    Constitutional: Appears well-developed and well-nourished. No distress.  HENT:  Head: Normocephalic and atraumatic.  Neck: Neck supple. No tracheal deviation present. No thyromegaly present.  No cervical lymphadenopathy Cardiovascular: Normal rate, regular rhythm and normal heart sounds.   No murmur heard. No carotid bruit .  No edema Pulmonary/Chest: Effort normal and breath sounds normal. No respiratory distress. No has no wheezes. No rales.  Skin: Skin is warm and dry. Not diaphoretic.  Psychiatric: Normal mood and affect. Behavior is normal.      Assessment & Plan:    See Problem List for Assessment and Plan of chronic medical problems.    This encounter was created in error - please disregard.

## 2021-02-19 ENCOUNTER — Encounter: Payer: Medicare Other | Admitting: Internal Medicine

## 2021-02-19 DIAGNOSIS — R7303 Prediabetes: Secondary | ICD-10-CM

## 2021-02-19 DIAGNOSIS — I10 Essential (primary) hypertension: Secondary | ICD-10-CM

## 2021-02-19 DIAGNOSIS — K219 Gastro-esophageal reflux disease without esophagitis: Secondary | ICD-10-CM

## 2021-02-20 NOTE — Progress Notes (Signed)
HPI F  former smoker followed for chronic insomnia, complicated by hx asthma, PAT, +PPD, GERD, Anxiety Daughter died in 27-Sep-2012, 10 yrs after heart transplant-congenital heart disease. PFT 11/07/16- WNL ------------------------------------------------------------------------------------   10/[birth year]63 yoF  former smoker followed for chronic insomnia, complicated by hx asthma, PAT, +PPD, GERD, Anxiety -----Followed for asthma & and insomnia; pt states breathing is at baseline; taking clonazepam and triazolam for sleep Ventolin hfa, Symbicort 80 Medication use is stable. Occasional ativan for daytime stress. Will eventually need gradual withdrawal from benzos and may need Behavioral Health emotional support with that, especially if still working.  Did home sleep test informally through her office and reports AHI less than 5. Not told of significant snoring or witnessed apnea. Asthma controlled. Occasional need for rescue inhaler, including sometimes at ngiht. CXR 10/06/2018-  IMPRESSION: No acute cardiopulmonary disease.  No evidence of TB.  02/23[birth year]/65 yoF former smoker followed for Chronic Insomnia, Asthma, complicated by PAT, +PPD, GERD, Anxiety/ Depression after loss of daughter,  -clonazepam and triazolam for sleep, Ventolin hfa, Symbicort 80 Covid vax-2 Phizer Flu vax- -----Pt states Klonopin is not working for her anymore. Haven't been able to sleep. No longer using triazolam. Hasn't needed inhalers in a long time because she is not exposed to smoke/ strong perfumes.   ROS-see HPI    + = positive Constitutional:   No-   weight loss, night sweats, fevers, chills, fatigue, lassitude. HEENT:   No-  headaches, difficulty swallowing, tooth/dental problems, sore throat,       No-  sneezing, itching, ear ache, nasal congestion, post nasal drip,  CV:  No-   chest pain, orthopnea, PND, swelling in lower extremities, anasarca, dizziness, palpitations Resp: +  shortness of breath with exertion or  at rest.              No-   productive cough,  +non-productive cough,  No- coughing up of blood.              No-   change in color of mucus.  +- wheezing.   Skin: No-   rash or lesions. GI:  No-   heartburn, indigestion, abdominal pain, nausea, vomiting, GU:. MS:  No-   joint pain or swelling.  No- decreased range of motion.  Some- back pain. Neuro-     nothing unusual Psych:  No- change in mood or affect. + depression or anxiety.  No memory loss.  OBJ- Physical Exam General- Alert, Oriented, Affect-appropriate, Distress- none acute, + obese Skin- rash-none, lesions- none, excoriation- none Lymphadenopathy- none Head- atraumatic            Eyes- Gross vision intact, PERRLA, conjunctivae and secretions clear            Ears- Hearing, canals-normal            Nose- Clear, no-Septal dev, mucus, polyps, erosion, perforation             Throat- Mallampati III , mucosa clear , drainage- none, tonsils- atrophic Neck- flexible , trachea midline, no stridor , thyroid nl, carotid no bruit Chest - symmetrical excursion , unlabored           Heart/CV- RRR , no murmur , no gallop  , no rub, nl s1 s2                           - JVD- none , edema- none, stasis changes- none, varices- none  Lung- clear to P&A, wheeze- none, cough- none , dullness-none, rub- none           Chest wall-  Abd-  Br/ Gen/ Rectal- Not done, not indicated Extrem- cyanosis- none, clubbing, none, atrophy- none, strength- nl Neuro- grossly intact to observation

## 2021-02-22 ENCOUNTER — Other Ambulatory Visit: Payer: Self-pay

## 2021-02-22 ENCOUNTER — Encounter: Payer: Self-pay | Admitting: Internal Medicine

## 2021-02-22 ENCOUNTER — Ambulatory Visit (INDEPENDENT_AMBULATORY_CARE_PROVIDER_SITE_OTHER): Payer: Medicare Other | Admitting: Internal Medicine

## 2021-02-22 DIAGNOSIS — J4530 Mild persistent asthma, uncomplicated: Secondary | ICD-10-CM

## 2021-02-22 DIAGNOSIS — F5104 Psychophysiologic insomnia: Secondary | ICD-10-CM

## 2021-02-22 MED ORDER — TRAZODONE HCL 50 MG PO TABS: ORAL_TABLET | ORAL | 1 refills | Status: DC

## 2021-02-22 NOTE — Patient Instructions (Signed)
Script sent to try trazodone 1-3 at bedtime as needed for sleep. Let me know if we need to try something else.

## 2021-02-22 NOTE — Assessment & Plan Note (Signed)
Has developed tolerance to current dose of clonazepam. Discussed alternatives. Plan- trial trazodone

## 2021-02-22 NOTE — Assessment & Plan Note (Signed)
Mild intermittent uncomplicated. 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 Plan- We will keep inhalers on med list for now.

## 2021-02-25 NOTE — Progress Notes (Signed)
Subjective:    Patient ID: Carrie Dalton, female    DOB: November 11, 1955, 65 y.o.   MRN: [id number]  This visit occurred during the SARS-CoV-2 public health emergency.  Safety protocols were in place, including screening questions prior to the visit, additional usage of staff PPE, and extensive cleaning of exam room while observing appropriate contact time as indicated for disinfecting solutions.     HPI The patient is here for follow up of their chronic medical problems, including htn, gerd, osteopenia, prediabetes   Since menopause - became hot natured.  Much worse in the past 8 months - really bad in the past 3 months.     Temp in bedroom at night is 64 - wakes up with sweats - happens during the day as well.  The sweating is all over, but bothers her most from her head.  No pattern to it - occurs frequently.  Better since it has gotten cooler but still occurring.  She denies associated symptoms.  It is much different than hot flashes.  She is worried about what is potentially causing this.   She does have some nausea, but that is chronic - she has a weak stomach.   She often cannot eat in the morning.  She takes dramamine as needed  At times feels chest tightness. No pattern to it - can occur at rest or with walking.   BP cuff at home pretty accurate with ours - at home 109/61, 136/75, 121/56, 159/79, 113/60, 132/72  She is not currently exercising regularly.  She can not do any exercise in the heat.  She plans on starting to walk and is much more active fall-spring   Medications and allergies reviewed with patient and updated if appropriate.  Patient Active Problem List   Diagnosis Date Noted   Bronchitis with asthma, acute 07/27/2020   Cough 07/19/2018   Vitamin D deficiency 07/18/2018   Abnormal EKG 07/07/2017   Closed fracture of right distal fibula 05/09/2017   Prediabetes 03/29/2017   Atrophic vaginitis 06/29/2016   Essential hypertension, benign 03/11/2016   GERD  (gastroesophageal reflux disease) 03/11/2016   Generalized anxiety disorder 02/17/2015   Obesity 02/17/2015   Osteopenia, high frax 11/26/2009   Asthma, mild persistent 11/05/2009   Other hyperlipidemia 08/28/2008   UNSPECIFIED PAROXYSMAL TACHYCARDIA 08/28/2008   History of TB skin testing 08/28/2008   DEGENERATIVE JOINT DISEASE 09/06/2007   Chronic insomnia 09/06/2007    Current Outpatient Medications on File Prior to Visit  Medication Sig Dispense Refill   albuterol (VENTOLIN HFA) 108 (90 Base) MCG/ACT inhaler INHALE 2 PUFFS INTO THE LUNGS EVERY 6 HOURS AS NEEDED FOR WHEEZING OR SHORTNESS OF BREATH. 18 g 12   aspirin 81 MG tablet Take 81 mg by mouth daily.     budesonide-formoterol (SYMBICORT) 80-4.5 MCG/ACT inhaler Inhale 2 puffs into the lungs 2 (two) times daily. 1 Inhaler 12   cholecalciferol (VITAMIN D) 1000 UNITS tablet Take 1,000 Units by mouth daily.     Nebivolol HCl (BYSTOLIC) 20 MG TABS TAKE 1/2 TABLET (10 MG TOTAL) BY MOUTH DAILY 45 tablet 1   pantoprazole (PROTONIX) 40 MG tablet Take 1 tablet by mouth once daily 90 tablet 0   phenazopyridine (PYRIDIUM) 200 MG tablet Take 1 tablet (200 mg total) by mouth 2 (two) times daily as needed. 60 tablet 11   traZODone (DESYREL) 50 MG tablet 1-3 tabs for sleep as needed 60 tablet 1   triamcinolone (KENALOG) 0.1 % triamcinolone acetonide  0.1 % topical cream     No current facility-administered medications on file prior to visit.    Past Medical History:  Diagnosis Date   ASTHMA, UNSPECIFIED, UNSPECIFIED STATUS    Closed fracture of right distal fibula 05/09/2017   DEGENERATIVE JOINT DISEASE    Family history of adverse reaction to anesthesia    ponv   HYPERCHOLESTEROLEMIA, BORDERLINE    NECK PAIN 11/05/2009   OSTEOPENIA    POSITIVE PPD    SLEEP DISORDER, HX OF    UNSPECIFIED PAROXYSMAL TACHYCARDIA     Past Surgical History:  Procedure Laterality Date   CESAREAN SECTION  1984   DILATION AND CURETTAGE OF UTERUS     x 3    ORIF FIBULA FRACTURE Right 05/09/2017   Procedure: OPEN REDUCTION INTERNAL FIXATION (ORIF) RIGHT DISTAL FIBULA FRACTURE;  Surgeon: Teryl Lucy, MD;  Location: [facility];  Service: Orthopedics;  Laterality: Right;    Social History   Socioeconomic History   Marital status: Married    Spouse name: Not on file   Number of children: 3   Years of education: Not on file   Highest education level: Not on file  Occupational History   Occupation: PATIENT CARE COORDIN    Employer: [employer]  Tobacco Use   Smoking status: Former    Packs/day: 0.50    Types: Cigarettes    Quit date: 05/23/2000    Years since quitting: 20.7   Smokeless tobacco: Never  Substance and Sexual Activity   Alcohol use: No   Drug use: No   Sexual activity: Not on file  Other Topics Concern   Not on file  Social History Narrative   Not on file   Social Determinants of Health   Financial Resource Strain: Not on file  Food Insecurity: Not on file  Transportation Needs: Not on file  Physical Activity: Not on file  Stress: Not on file  Social Connections: Not on file    Family History  Problem Relation Age of Onset   Diabetes Father    Hypertension Father    Stroke Mother    Atrial fibrillation Mother    Prostate cancer Maternal Grandfather    Atrial fibrillation Maternal Grandmother    Heart failure Maternal Grandmother        CHF   Hypertension Maternal Grandmother    Heart disease Daughter        heart transplant   Pancreatitis Daughter     Review of Systems  Constitutional:  Negative for chills, fatigue and fever.  Eyes:  Negative for visual disturbance.  Respiratory:  Positive for shortness of breath. Negative for cough and wheezing.   Cardiovascular:  Positive for chest pain (occ chest tightness - with rest/activity). Negative for palpitations and leg swelling.  Gastrointestinal:  Positive for nausea. Negative for abdominal pain, constipation and diarrhea.        Gerd controlled  Neurological:  Positive for headaches. Negative for dizziness and light-headedness.      Objective:   Vitals:   02/26/21 1110  BP: (!) 146/92  Pulse: 73  Temp: 98.3 F (36.8 C)  SpO2: 97%   BP Readings from Last 3 Encounters:  02/26/21 (!) 146/92  02/22/21 122/74  07/27/20 (!) 144/82   Wt Readings from Last 3 Encounters:  02/26/21 193 lb (87.5 kg)  02/22/21 193 lb 3.2 oz (87.6 kg)  07/27/20 190 lb (86.2 kg)   Body mass index is 35.3 kg/m.   Physical Exam  Constitutional: Appears well-developed and well-nourished. No distress.  HENT:  Head: Normocephalic and atraumatic.  Neck: Neck supple. No tracheal deviation present. No thyromegaly present.  No cervical lymphadenopathy Cardiovascular: Normal rate, regular rhythm and normal heart sounds.   No murmur heard. No carotid bruit .  No edema Pulmonary/Chest: Effort normal and breath sounds normal. No respiratory distress. No has no wheezes. No rales.  Skin: Skin is warm and dry. Not diaphoretic.  Psychiatric: Normal mood and affect. Behavior is normal.      Assessment & Plan:    See Problem List for Assessment and Plan of chronic medical problems.

## 2021-02-25 NOTE — Patient Instructions (Addendum)
     Blood work was ordered.     Medications changes include :  none   Your prescription(s) have been submitted to your pharmacy. Please take as directed and contact our office if you believe you are having problem(s) with the medication(s).   A referral was ordered for Dr End.   Someone from their office will call you to schedule an appointment.    Please followup in 6 months

## 2021-02-26 ENCOUNTER — Other Ambulatory Visit: Payer: Self-pay

## 2021-02-26 ENCOUNTER — Ambulatory Visit (INDEPENDENT_AMBULATORY_CARE_PROVIDER_SITE_OTHER): Payer: Medicare Other | Admitting: Internal Medicine

## 2021-02-26 ENCOUNTER — Encounter: Payer: Self-pay | Admitting: Internal Medicine

## 2021-02-26 VITALS — BP 146/92 | HR 73 | Temp 98.3°F | Ht 62.0 in | Wt 193.0 lb

## 2021-02-26 DIAGNOSIS — R61 Generalized hyperhidrosis: Secondary | ICD-10-CM

## 2021-02-26 DIAGNOSIS — K219 Gastro-esophageal reflux disease without esophagitis: Secondary | ICD-10-CM | POA: Diagnosis not present

## 2021-02-26 DIAGNOSIS — I10 Essential (primary) hypertension: Secondary | ICD-10-CM

## 2021-02-26 DIAGNOSIS — E7849 Other hyperlipidemia: Secondary | ICD-10-CM

## 2021-02-26 DIAGNOSIS — R7303 Prediabetes: Secondary | ICD-10-CM | POA: Diagnosis not present

## 2021-02-26 DIAGNOSIS — M85851 Other specified disorders of bone density and structure, right thigh: Secondary | ICD-10-CM

## 2021-02-26 DIAGNOSIS — R0789 Other chest pain: Secondary | ICD-10-CM | POA: Insufficient documentation

## 2021-02-26 LAB — CBC WITH DIFFERENTIAL/PLATELET
Basophils Absolute: 0.1 10*3/uL (ref 0.0–0.1)
Basophils Relative: 0.9 % (ref 0.0–3.0)
Eosinophils Absolute: 1 10*3/uL — ABNORMAL HIGH (ref 0.0–0.7)
Eosinophils Relative: 12.8 % — ABNORMAL HIGH (ref 0.0–5.0)
HCT: 43.2 % (ref 36.0–46.0)
Hemoglobin: 14.2 g/dL (ref 12.0–15.0)
Lymphocytes Relative: 18.1 % (ref 12.0–46.0)
Lymphs Abs: 1.4 10*3/uL (ref 0.7–4.0)
MCHC: 33 g/dL (ref 30.0–36.0)
MCV: 88.6 fl (ref 78.0–100.0)
Monocytes Absolute: 0.6 10*3/uL (ref 0.1–1.0)
Monocytes Relative: 7.9 % (ref 3.0–12.0)
Neutro Abs: 4.5 10*3/uL (ref 1.4–7.7)
Neutrophils Relative %: 60.3 % (ref 43.0–77.0)
Platelets: 267 10*3/uL (ref 150.0–400.0)
RBC: 4.88 Mil/uL (ref 3.87–5.11)
RDW: 12.9 % (ref 11.5–15.5)
WBC: 7.5 10*3/uL (ref 4.0–10.5)

## 2021-02-26 LAB — COMPREHENSIVE METABOLIC PANEL
ALT: 11 U/L (ref 0–35)
AST: 14 U/L (ref 0–37)
Albumin: 4.1 g/dL (ref 3.5–5.2)
Alkaline Phosphatase: 76 U/L (ref 39–117)
BUN: 8 mg/dL (ref 6–23)
CO2: 29 mEq/L (ref 19–32)
Calcium: 9.4 mg/dL (ref 8.4–10.5)
Chloride: 105 mEq/L (ref 96–112)
Creatinine, Ser: 0.73 mg/dL (ref 0.40–1.20)
GFR: 85.87 mL/min (ref >60.00)
Glucose, Bld: 103 mg/dL — ABNORMAL HIGH (ref 70–99)
Potassium: 4.4 mEq/L (ref 3.5–5.1)
Sodium: 140 mEq/L (ref 135–145)
Total Bilirubin: 0.4 mg/dL (ref 0.2–1.2)
Total Protein: 6.8 g/dL (ref 6.0–8.3)

## 2021-02-26 LAB — HEMOGLOBIN A1C: Hgb A1c MFr Bld: 5.7 % (ref 4.6–6.5)

## 2021-02-26 LAB — LIPID PANEL
Cholesterol: 210 mg/dL — ABNORMAL HIGH (ref 0–200)
HDL: 47.6 mg/dL (ref >39.00)
LDL Cholesterol: 140 mg/dL — ABNORMAL HIGH (ref 0–99)
NonHDL: 162.67
Total CHOL/HDL Ratio: 4
Triglycerides: 115 mg/dL (ref 0.0–149.0)
VLDL: 23 mg/dL (ref 0.0–40.0)

## 2021-02-26 LAB — TSH: TSH: 0.92 u[IU]/mL (ref 0.35–5.50)

## 2021-02-26 MED ORDER — PANTOPRAZOLE SODIUM 40 MG PO TBEC: 40.0000 mg | DELAYED_RELEASE_TABLET | Freq: Every day | ORAL | 3 refills | Status: DC

## 2021-02-26 NOTE — Assessment & Plan Note (Addendum)
Chronic Blood pressure well controlled at home-she did bring her BP cuff in with her and it is accurate CMP Continue Bystolic 10 mg daily

## 2021-02-26 NOTE — Assessment & Plan Note (Signed)
Chronic ?Regular exercise and healthy diet encouraged ?Check lipid panel  ?Diet controlled ?

## 2021-02-26 NOTE — Assessment & Plan Note (Signed)
Chronic GERD controlled Continue pantoprazole 40 mg daily 

## 2021-02-26 NOTE — Assessment & Plan Note (Signed)
New Does have intermittent chest tightness-occurs at rest and with activity so it is somewhat atypical for cardiac cause Given her episodes of diaphoresis and chest tightness will refer to cardiology for further evaluation

## 2021-02-26 NOTE — Assessment & Plan Note (Signed)
Now Episodes of diaphoresis that are severe at times-occurs at night and during the day She is a hot natured person, but the sweating episodes are affecting her quality of life No obvious cause Will check basic blood work including CBC, TSH, CMP Not likely to be hormonal, but she does see her gynecologist in a couple of months They do not sound cardiac in nature, but will be seen in cardiology

## 2021-02-26 NOTE — Assessment & Plan Note (Signed)
Chronic Check a1c Low sugar / carb diet Stressed regular exercise  

## 2021-03-01 ENCOUNTER — Telehealth: Payer: Self-pay | Admitting: Internal Medicine

## 2021-03-01 MED ORDER — ESZOPICLONE 3 MG PO TABS: ORAL_TABLET | ORAL | 1 refills | Status: DC

## 2021-03-01 NOTE — Telephone Encounter (Signed)
I have sent script for lunesta to [facility]

## 2021-03-01 NOTE — Telephone Encounter (Signed)
I have called the pt and she stated that CY had started her on trazodone.  She stated that the first night she took it she did sleep ok for her.  The second night she woke up around 3 and could not go back to sleep.  She said last night she didn't sleep at all.  She did take 3 tablets last night.  She wanted to call CY and let him know that this medication is not working.  CY please advise. Thanks

## 2021-03-01 NOTE — Telephone Encounter (Signed)
I have called the pt and she is aware of CY recs.  She will try the lunesta.

## 2021-03-04 ENCOUNTER — Telehealth: Payer: Self-pay | Admitting: Internal Medicine

## 2021-03-04 NOTE — Telephone Encounter (Signed)
Documentation- Message from [place]. Eszopiclone 3 mg blamed for causing nausea, raising BP Before she was taking clonazepam 0.5 mg, 3 tabs, but no longer helping sleep. Plan- We are putting sample Dayvigo 5 mg up front 1-2 tabs at bedtime for sleep. Until she can pick that up, she will try clonazepam 0.5 mg x 4 tabs at bedtime.

## 2021-03-04 NOTE — Telephone Encounter (Signed)
Clonazepam 0.5 mg x 3 tabs no longer effective for chronic insomnia. Tries Lunesta 3 mg but she blamed it for nausea and high BP. Today she will try clonazepam 4 x 0.5 mg. Tomorrow she wwill come to pick up samples Dayvigo 5 mg, 1-2 for sleep as needed, for trial.

## 2021-03-04 NOTE — Telephone Encounter (Signed)
Spoke to patient via telephone.  C/o nausea, vomiting and metallic taste in mouth since starting Eszopiclone. Her BP is spiking to 158/98 with laying. She started Eszopiclone on 03/01/2021. Sx developed same day.  She has lost 10 pounds since.    Dr. Maple Hudson, please advise. Thanks.

## 2021-03-30 NOTE — Progress Notes (Signed)
New Outpatient Visit Date: 03/31/2021  Referring Provider: Pincus Sanes, MD [address] [place],  [place] [address]  Chief Complaint: Diaphoresis  HPI:  Carrie Dalton is a 65 y.o. female who is being seen today for the evaluation of chest tightness at the request of Dr. Lawerance Bach. She has a history of hypertension, hyperlipidemia, prediabetes, asthma, GERD, and osteopenia.  Carrie Dalton reports that for the last 6 months, she has had intermittent episodes of flushing and marked diaphoresis.  This seems to have been worse over the summer due to heat, though it continues to happen at times.  She has experienced this up to 3-4 times per day.  There are no clear precipitants.  She also reports random tightness in her chest that typically only lasts about 10 seconds and is not clearly related to her flushing and diaphoresis.  On further questioning, she also notes some vague tightness in her chest with exertion and mild shortness of breath.  Carrie Dalton denies a history of heart disease other than intermittent elevated heart rates for which she saw Dr. Clarene Duke many years ago.  She reports having undergone a stress test at least 20 years ago and believes this was normal.  She has occasional transient palpitations at rest that take her breath away for a second or two.  She notes occasional swelling in her feet and legs as well.  She previously did a home sleep study while she was working with Dr. Nicholos Johns and believes it was normal.  --------------------------------------------------------------------------------------------------  Cardiovascular History & Procedures: Cardiovascular Problems: Chest tightness Flushing and diaphoresis  Risk Factors: Hypertension, hyperlipidemia, prediabetes, obesity, prior tobacco use, and age greater than 39  Cath/PCI: None  CV Surgery: None  EP Procedures and Devices: None  Non-Invasive Evaluation(s): Stress test ~20 years ago - normal per patient  Recent  CV Pertinent Labs: Lab Results  Component Value Date   CHOL 210 (H) 02/26/2021   HDL 47.60 02/26/2021   LDLCALC 140 (H) 02/26/2021   LDLCALC 126 (H) 02/18/2020   LDLDIRECT 161.1 03/13/2012   TRIG 115.0 02/26/2021   CHOLHDL 4 02/26/2021   K 4.4 02/26/2021   BUN 8 02/26/2021   CREATININE 0.73 02/26/2021   CREATININE 0.73 02/18/2020    --------------------------------------------------------------------------------------------------  Past Medical History:  Diagnosis Date   ASTHMA, UNSPECIFIED, UNSPECIFIED STATUS    Closed fracture of right distal fibula 05/09/2017   DEGENERATIVE JOINT DISEASE    Family history of adverse reaction to anesthesia    ponv   Hyperlipidemia    Hypertension    NECK PAIN 11/05/2009   OSTEOPENIA    POSITIVE PPD    SLEEP DISORDER, HX OF    UNSPECIFIED PAROXYSMAL TACHYCARDIA     Past Surgical History:  Procedure Laterality Date   CESAREAN SECTION  1984   DILATION AND CURETTAGE OF UTERUS     x 3   ORIF FIBULA FRACTURE Right 05/09/2017   Procedure: OPEN REDUCTION INTERNAL FIXATION (ORIF) RIGHT DISTAL FIBULA FRACTURE;  Surgeon: Teryl Lucy, MD;  Location: [facility];  Service: Orthopedics;  Laterality: Right;    Current Meds  Medication Sig   albuterol (VENTOLIN HFA) 108 (90 Base) MCG/ACT inhaler INHALE 2 PUFFS INTO THE LUNGS EVERY 6 HOURS AS NEEDED FOR WHEEZING OR SHORTNESS OF BREATH.   aspirin 81 MG tablet Take 81 mg by mouth daily.   budesonide-formoterol (SYMBICORT) 80-4.5 MCG/ACT inhaler Inhale 2 puffs into the lungs 2 (two) times daily as needed.   cholecalciferol (VITAMIN D)  1000 UNITS tablet Take 1,000 Units by mouth daily.   metoprolol tartrate (LOPRESSOR) 100 MG tablet Take 1 tablet (100 mg total) by mouth once for 1 dose. Take TWO hours prior to CT procedure   Nebivolol HCl (BYSTOLIC) 20 MG TABS TAKE 1/2 TABLET (10 MG TOTAL) BY MOUTH DAILY   pantoprazole (PROTONIX) 40 MG tablet Take 1 tablet (40 mg total) by mouth daily.    phenazopyridine (PYRIDIUM) 200 MG tablet Take 1 tablet (200 mg total) by mouth 2 (two) times daily as needed.   triamcinolone (KENALOG) 0.1 % Apply 1 application topically as needed.    Allergies: Codeine, Compazine, Lunesta [eszopiclone], Merthiolate [thimerosal], Nickel, and Prochlorperazine edisylate  Social History   Tobacco Use   Smoking status: Former    Packs/day: 0.50    Types: Cigarettes    Quit date: 1992    Years since quitting: 30.8   Smokeless tobacco: Never  Vaping Use   Vaping Use: Never used  Substance Use Topics   Alcohol use: No   Drug use: No    Family History  Problem Relation Age of Onset   Heart failure Mother    Stroke Mother    Atrial fibrillation Mother    Heart attack Father    Heart failure Father    Diabetes Father    Hypertension Father    Heart attack Brother 52       half-brother   Atrial fibrillation Maternal Grandmother    Heart failure Maternal Grandmother        CHF   Hypertension Maternal Grandmother    Prostate cancer Maternal Grandfather    Pancreatitis Daughter    Heart disease Daughter 22       Restrictive cardiomyopathy status post heart transplant    Review of Systems: A 12-system review of systems was performed and was negative except as noted in the HPI.  --------------------------------------------------------------------------------------------------  Physical Exam: BP 140/80 (BP Location: Right Arm, Patient Position: Sitting, Cuff Size: Large)   Pulse 70   Ht 5\' 2"  (1.575 m)   Wt 190 lb (86.2 kg)   SpO2 98%   BMI 34.75 kg/m   General: NAD. HEENT: No conjunctival pallor or scleral icterus. Facemask in place. Neck: Supple without lymphadenopathy, thyromegaly, JVD, or HJR. No carotid bruit. Lungs: Normal work of breathing. Clear to auscultation bilaterally without wheezes or crackles. Heart: Regular rate and rhythm without murmurs, rubs, or gallops. Non-displaced PMI. Abd: Bowel sounds present. Soft, NT/ND  without hepatosplenomegaly Ext: No lower extremity edema. Radial, PT, and DP pulses are 2+ bilaterally Skin: Warm and dry without rash. Neuro: CNIII-XII intact. Strength and fine-touch sensation intact in upper and lower extremities bilaterally. Psych: Normal mood and affect.  EKG: Baseline artifact.  Normal sinus rhythm with nonspecific T wave changes.  No significant change from prior tracing on 05/08/2017.  Lab Results  Component Value Date   WBC 7.5 02/26/2021   HGB 14.2 02/26/2021   HCT 43.2 02/26/2021   MCV 88.6 02/26/2021   PLT 267.0 02/26/2021    Lab Results  Component Value Date   NA 140 02/26/2021   K 4.4 02/26/2021   CL 105 02/26/2021   CO2 29 02/26/2021   BUN 8 02/26/2021   CREATININE 0.73 02/26/2021   GLUCOSE 103 (H) 02/26/2021   ALT 11 02/26/2021    Lab Results  Component Value Date   CHOL 210 (H) 02/26/2021   HDL 47.60 02/26/2021   LDLCALC 140 (H) 02/26/2021   LDLDIRECT 161.1 03/13/2012  TRIG 115.0 02/26/2021   CHOLHDL 4 02/26/2021   Lab Results  Component Value Date   TSH 0.92 02/26/2021    --------------------------------------------------------------------------------------------------  ASSESSMENT AND PLAN: Precordial pain and dyspnea on exertion: Symptoms are nonspecific though some exertional component is present for both chest tightness and shortness of breath.  We have discussed further evaluation options and have agreed to obtain an echocardiogram and coronary CTA.  Flushing and diaphoresis: Episodes have been present for 6 months and they are not related to specific activities.  It is difficult to know if underlying cardiac disease or some other process is driving these events.  Recent labs were unrevealing.  We will obtain an echo and coronary CTA, as above.  If this work-up is unrevealing, we may need to consider ambulatory cardiac monitoring to exclude paroxysmal arrhythmia leading to flushing/diaphoresis.  Hypertension: Blood pressure  borderline elevated today.  Continue current dose of nebivolol.  Hyperlipidemia: Most recent lipid panel last month notable for LDL of 140.  We will need to consider statin therapy and/or lifestyle modifications based on results of aforementioned testing.  Follow-up: Return to clinic in 4-6 weeks.  Yvonne Kendall, MD 03/31/2021 2:19 PM

## 2021-03-31 ENCOUNTER — Ambulatory Visit (INDEPENDENT_AMBULATORY_CARE_PROVIDER_SITE_OTHER): Payer: Medicare Other | Admitting: Internal Medicine

## 2021-03-31 ENCOUNTER — Encounter: Payer: Self-pay | Admitting: Internal Medicine

## 2021-03-31 ENCOUNTER — Other Ambulatory Visit: Payer: Self-pay

## 2021-03-31 VITALS — BP 140/80 | HR 70 | Ht 62.0 in | Wt 190.0 lb

## 2021-03-31 DIAGNOSIS — R0609 Other forms of dyspnea: Secondary | ICD-10-CM

## 2021-03-31 DIAGNOSIS — R232 Flushing: Secondary | ICD-10-CM

## 2021-03-31 DIAGNOSIS — R61 Generalized hyperhidrosis: Secondary | ICD-10-CM

## 2021-03-31 DIAGNOSIS — R0602 Shortness of breath: Secondary | ICD-10-CM | POA: Insufficient documentation

## 2021-03-31 DIAGNOSIS — I10 Essential (primary) hypertension: Secondary | ICD-10-CM

## 2021-03-31 DIAGNOSIS — R072 Precordial pain: Secondary | ICD-10-CM

## 2021-03-31 DIAGNOSIS — R079 Chest pain, unspecified: Secondary | ICD-10-CM | POA: Insufficient documentation

## 2021-03-31 DIAGNOSIS — E7800 Pure hypercholesterolemia, unspecified: Secondary | ICD-10-CM | POA: Insufficient documentation

## 2021-03-31 MED ORDER — METOPROLOL TARTRATE 100 MG PO TABS: 100.0000 mg | ORAL_TABLET | Freq: Once | ORAL | 0 refills | Status: DC

## 2021-03-31 NOTE — Patient Instructions (Signed)
Medication Instructions:   Your physician recommends that you continue on your current medications as directed. Please refer to the Current Medication list given to you today.  *If you need a refill on your cardiac medications before your next appointment, please call your pharmacy*   Lab Work:  BMET today  If you have labs (blood work) drawn today and your tests are completely normal, you will receive your results only by: MyChart Message (if you have MyChart) OR A paper copy in the mail If you have any lab test that is abnormal or we need to change your treatment, we will call you to review the results.   Testing/Procedures:  1) Your physician has requested that you have an echocardiogram. Echocardiography is a painless test that uses sound waves to create images of your heart. It provides your doctor with information about the size and shape of your heart and how well your heart's chambers and valves are working. This procedure takes approximately one hour. There are no restrictions for this procedure.  2) Your cardiac CT will be scheduled at one of the below locations:   [facility] [address] [place], [place] [address] [phone]   Please arrive 15 mins early for check-in and test prep.  Please follow these instructions carefully (unless otherwise directed):   On the Night Before the Test: Be sure to Drink plenty of water. Do not consume any caffeinated/decaffeinated beverages or chocolate 12 hours prior to your test. Do not take any antihistamines 12 hours prior to your test.   On the Day of the Test: Drink plenty of water until 1 hour prior to the test. Do not eat any food 4 hours prior to the test. You may take your regular medications prior to the test.  Take metoprolol (Lopressor) two hours prior to test. FEMALES- please wear underwire-free bra if available, avoid dresses & tight clothing        After the  Test: Drink plenty of water. After receiving IV contrast, you may experience a mild flushed feeling. This is normal. On occasion, you may experience a mild rash up to 24 hours after the test. This is not dangerous. If this occurs, you can take Benadryl 25 mg and increase your fluid intake. If you experience trouble breathing, this can be serious. If it is severe call 911 IMMEDIATELY. If it is mild, please call our office. If you take any of these medications: Glipizide/Metformin, Avandament, Glucavance, please do not take 48 hours after completing test unless otherwise instructed.   For non-scheduling related questions, please contact the cardiac imaging nurse navigator should you have any questions/concerns: Rockwell Alexandria, Cardiac Imaging Nurse Navigator Larey Brick, Cardiac Imaging Nurse Navigator [facility] Direct Office Dial: [phone]   For scheduling needs, including cancellations and rescheduling, please call [place], [phone].   Follow-Up: At [facility], you and your health needs are our priority.  As part of our continuing mission to provide you with exceptional heart care, we have created designated Provider Care Teams.  These Care Teams include your primary Cardiologist (physician) and Advanced Practice Providers (APPs -  Physician Assistants and Nurse Practitioners) who all work together to provide you with the care you need, when you need it.  We recommend signing up for the patient portal called "MyChart".  Sign up information is provided on this After Visit Summary.  MyChart is used to connect with patients for Virtual Visits (Telemedicine).  Patients are  able to view lab/test results, encounter notes, upcoming appointments, etc.  Non-urgent messages can be sent to your provider as well.   To learn more about what you can do with MyChart, go to [email or web address].    Your next appointment:    4 - 6  week(s)  The format for your  next appointment:   In Person  Provider:   You may see Dr. Cristal Deer End or one of the following Advanced Practice Providers on your designated Care Team:   Nicolasa Ducking, NP Eula Listen, PA-C Cadence Fransico Michael, [place]

## 2021-04-01 LAB — BASIC METABOLIC PANEL
BUN/Creatinine Ratio: 10 — ABNORMAL LOW (ref 12–28)
BUN: 8 mg/dL (ref 8–27)
CO2: 25 mmol/L (ref 20–29)
Calcium: 9.8 mg/dL (ref 8.7–10.3)
Chloride: 107 mmol/L — ABNORMAL HIGH (ref 96–106)
Creatinine, Ser: 0.82 mg/dL (ref 0.57–1.00)
Glucose: 106 mg/dL — ABNORMAL HIGH (ref 70–99)
Potassium: 5 mmol/L (ref 3.5–5.2)
Sodium: 145 mmol/L — ABNORMAL HIGH (ref 134–144)
eGFR: 79 mL/min/{1.73_m2} (ref >59)

## 2021-04-02 ENCOUNTER — Telehealth: Payer: Self-pay | Admitting: Internal Medicine

## 2021-04-02 MED ORDER — CLONAZEPAM 1 MG PO TABS
ORAL_TABLET | ORAL | 5 refills | Status: DC
Start: 2021-04-02 — End: 2021-10-05

## 2021-04-02 NOTE — Telephone Encounter (Signed)
Clonazepam script sent, changing to 1 mg tabs, to take up to 2 at bedtime if needed.

## 2021-04-02 NOTE — Telephone Encounter (Signed)
Call made to patient, confirmed DOB. Patient states she tried the  [facility] as [facility] with the clonazepam but they do not work. She took the 4 clonazepam and it worked. She reports taking two medications does not make sense to her if she can just take one.   She is requesting Clonazepam 0.5 mg take 4 tabs po nightly for sleep.   CY please advise. Thanks :)

## 2021-04-02 NOTE — Telephone Encounter (Signed)
Called and spoke with Patient.  Dr. Roxy Cedar instructions given.  Understanding stated.  Nothing further at this time.

## 2021-04-06 ENCOUNTER — Telehealth: Payer: Self-pay

## 2021-04-06 NOTE — Telephone Encounter (Signed)
-----   Message from Annia Belt, RN sent at 04/02/2021 11:41 AM EST ----- Regarding: f/u appt Hi Pilar,   This pt's follow up is before her Echo. Can we call her to either schedule echo before f/u or move f/u appt to after the echo.   Thanks for your help!  Aundra Millet

## 2021-04-06 NOTE — Telephone Encounter (Signed)
LVM to reschedule.

## 2021-04-09 ENCOUNTER — Telehealth (HOSPITAL_COMMUNITY): Payer: Self-pay | Admitting: *Deleted

## 2021-04-09 NOTE — Telephone Encounter (Signed)
Attempted to call patient regarding upcoming cardiac CT appointment. °Left message on voicemail with name and callback number ° °Alaine Loughney RN Navigator Cardiac Imaging °[facility] °[phone] Office °[phone] Cell ° °

## 2021-04-12 ENCOUNTER — Ambulatory Visit
Admission: RE | Admit: 2021-04-12 | Discharge: 2021-04-12 | Disposition: A | Discharge status: Home / Self Care | Payer: Medicare Other | Source: Ambulatory Visit | Attending: Internal Medicine | Admitting: Internal Medicine

## 2021-04-12 ENCOUNTER — Other Ambulatory Visit: Payer: Self-pay

## 2021-04-12 DIAGNOSIS — R0609 Other forms of dyspnea: Secondary | ICD-10-CM | POA: Diagnosis present

## 2021-04-12 DIAGNOSIS — R072 Precordial pain: Secondary | ICD-10-CM | POA: Insufficient documentation

## 2021-04-12 MED ORDER — METOPROLOL TARTRATE 5 MG/5ML IV SOLN
10.0000 mg | Freq: Once | INTRAVENOUS | Status: AC
  Administered 2021-04-12: 10 mg via INTRAVENOUS

## 2021-04-12 MED ORDER — IOHEXOL 350 MG/ML SOLN
100.0000 mL | Freq: Once | INTRAVENOUS | Status: AC | PRN
  Administered 2021-04-12: 100 mL via INTRAVENOUS

## 2021-04-12 MED ORDER — DILTIAZEM HCL 25 MG/5ML IV SOLN
10.0000 mg | Freq: Once | INTRAVENOUS | Status: AC
  Administered 2021-04-12: 10 mg via INTRAVENOUS

## 2021-04-12 MED ORDER — NITROGLYCERIN 0.4 MG SL SUBL
0.8000 mg | SUBLINGUAL_TABLET | Freq: Once | SUBLINGUAL | Status: AC
  Administered 2021-04-12: 0.8 mg via SUBLINGUAL

## 2021-04-12 NOTE — Progress Notes (Signed)
Patient tolerated procedure well. Ambulate w/o difficulty. Denies light headedness or being dizzy. Sitting in chair drinking water provided. Encouraged to drink extra water today and reasoning explained. Verbalized understanding. All questions answered. ABC intact. No further needs. Discharge from procedure area w/o issues.   °

## 2021-04-14 ENCOUNTER — Telehealth: Payer: Self-pay | Admitting: Internal Medicine

## 2021-04-14 NOTE — Telephone Encounter (Signed)
Patient returning call for results   Scheduled sooner fu as advised on avs and will waitlist echo for prior to visit . Will fu with patient on Monday

## 2021-04-14 NOTE — Telephone Encounter (Signed)
Patient made aware of Cardiac CTA results and Dr. Serita Kyle recommendation with verbalized understanding.

## 2021-04-14 NOTE — Telephone Encounter (Signed)
Patient returning call.

## 2021-04-14 NOTE — Telephone Encounter (Signed)
Attempted to call the patient. No answer- I left a message to please call back.  No DPR on file.  

## 2021-04-14 NOTE — Telephone Encounter (Signed)
Yvonne Kendall, MD  04/13/2021  8:24 PM EST     Please let Ms. Feely know that her coronary CTA is normal without any evidence of narrowing or blockage in her heart arteries.  I recommend that she continue her current medications and proceed with echocardiogram as scheduled.

## 2021-04-14 NOTE — Telephone Encounter (Signed)
Returned the patients call. Lmtcb. 

## 2021-04-30 ENCOUNTER — Ambulatory Visit: Payer: Medicare Other | Admitting: Nurse Practitioner

## 2021-05-06 ENCOUNTER — Ambulatory Visit (INDEPENDENT_AMBULATORY_CARE_PROVIDER_SITE_OTHER): Payer: Medicare Other

## 2021-05-06 ENCOUNTER — Ambulatory Visit (INDEPENDENT_AMBULATORY_CARE_PROVIDER_SITE_OTHER): Payer: Medicare Other | Admitting: Internal Medicine

## 2021-05-06 ENCOUNTER — Other Ambulatory Visit: Payer: Self-pay

## 2021-05-06 ENCOUNTER — Encounter: Payer: Self-pay | Admitting: Internal Medicine

## 2021-05-06 VITALS — BP 130/80 | HR 77 | Ht 62.0 in | Wt 187.0 lb

## 2021-05-06 DIAGNOSIS — R079 Chest pain, unspecified: Secondary | ICD-10-CM | POA: Diagnosis not present

## 2021-05-06 DIAGNOSIS — R072 Precordial pain: Secondary | ICD-10-CM | POA: Diagnosis not present

## 2021-05-06 DIAGNOSIS — E7800 Pure hypercholesterolemia, unspecified: Secondary | ICD-10-CM | POA: Diagnosis not present

## 2021-05-06 DIAGNOSIS — R0609 Other forms of dyspnea: Secondary | ICD-10-CM | POA: Diagnosis not present

## 2021-05-06 DIAGNOSIS — R002 Palpitations: Secondary | ICD-10-CM

## 2021-05-06 DIAGNOSIS — R0602 Shortness of breath: Secondary | ICD-10-CM

## 2021-05-06 LAB — ECHOCARDIOGRAM COMPLETE
AR max vel: 2.46 cm2
AV Area VTI: 2.44 cm2
AV Area mean vel: 2.21 cm2
AV Mean grad: 3 mmHg
AV Peak grad: 5.8 mmHg
Ao pk vel: 1.2 m/s
Area-P 1/2: 2.26 cm2
Calc EF: 55.7 %
Height: 62 in
S' Lateral: 3 cm
Single Plane A2C EF: 54.3 %
Single Plane A4C EF: 61 %
Weight: 2992 oz

## 2021-05-06 NOTE — Patient Instructions (Signed)
Medication Instructions:   Your physician recommends that you continue on your current medications as directed. Please refer to the Current Medication list given to you today.  *If you need a refill on your cardiac medications before your next appointment, please call your pharmacy*   Lab Work:  None ordered  Testing/Procedures:  Your physician has recommended that you wear a Zio XT monitor for TWO WEEKS.  This will be mailed to you.    This monitor is a medical device that records the hearts electrical activity. Doctors most often use these monitors to diagnose arrhythmias. Arrhythmias are problems with the speed or rhythm of the heartbeat. The monitor is a small device applied to your chest. You can wear one while you do your normal daily activities. While wearing this monitor if you have any symptoms to push the button and record what you felt. Once you have worn this monitor for the period of time provider prescribed (Usually 14 days), you will return the monitor device in the postage paid box. Once it is returned they will download the data collected and provide [place] with a report which the provider will then review and we will call you with those results. Important tips:  Avoid showering during the first 24 hours of wearing the monitor. Avoid excessive sweating to help maximize wear time. Do not submerge the device, no hot tubs, and no swimming pools. Keep any lotions or oils away from the patch. After 24 hours you may shower with the patch on. Take brief showers with your back facing the shower head.  Do not remove patch once it has been placed because that will interrupt data and decrease adhesive wear time. Push the button when you have any symptoms and write down what you were feeling. Once you have completed wearing your monitor, remove and place into box which has postage paid and place in your outgoing mailbox.  If for some reason you have misplaced your box then call our  office and we can provide another box and/or mail it off for you.    Follow-Up: At [facility], you and your health needs are our priority.  As part of our continuing mission to provide you with exceptional heart care, we have created designated Provider Care Teams.  These Care Teams include your primary Cardiologist (physician) and Advanced Practice Providers (APPs -  Physician Assistants and Nurse Practitioners) who all work together to provide you with the care you need, when you need it.  We recommend signing up for the patient portal called "MyChart".  Sign up information is provided on this After Visit Summary.  MyChart is used to connect with patients for Virtual Visits (Telemedicine).  Patients are able to view lab/test results, encounter notes, upcoming appointments, etc.  Non-urgent messages can be sent to your provider as well.   To learn more about what you can do with MyChart, go to [email or web address].    Your next appointment:   3 month(s)  The format for your next appointment:   In Person  Provider:   You may see Yvonne Kendall, MD or one of the following Advanced Practice Providers on your designated Care Team:   Nicolasa Ducking, NP Eula Listen, PA-C Cadence Fransico Michael, [place]

## 2021-05-06 NOTE — Progress Notes (Signed)
Follow-up Outpatient Visit Date: 05/06/2021  Primary Care Provider: Binnie Rail, MD [place] [address]  Chief Complaint: Follow-up chest pain and palpitations  HPI:  Carrie Dalton is a 65 y.o. female with history of hypertension, hyperlipidemia, prediabetes, asthma, GERD, and osteopenia, who presents for follow-up of chest pain.  I met her a month ago, at which time she complained of intermittent flushing and diaphoresis as well as chest pain.  We agreed to obtain a coronary CTA, which was normal.  She is scheduled for echocardiogram later this afternoon.  Carrie Dalton, Carrie Dalton reports that she has been feeling fairly well.  She still has occasional flutters in her chest as well as flushing.  Flushing seems to be less severe now that the weather is not as hot as in the summer.  She sometimes feels like she suddenly needs to take a deep breath though she reports only minimal frank dyspnea.  She has not had any further chest pain.  She also denies lightheadedness and edema.  --------------------------------------------------------------------------------------------------  Cardiovascular History & Procedures: Cardiovascular Problems: Chest tightness Flushing and diaphoresis   Risk Factors: Hypertension, hyperlipidemia, prediabetes, obesity, prior tobacco use, and age greater than 45   Cath/PCI: None   CV Surgery: None   EP Procedures and Devices: None   Non-Invasive Evaluation(s): Coronary CTA (04/12/2021): Normal study without CAD.  CAC 0.  No significant extracardiac findings.  Recent CV Pertinent Labs: Lab Results  Component Value Date   CHOL 210 (H) 02/26/2021   HDL 47.60 02/26/2021   LDLCALC 140 (H) 02/26/2021   LDLCALC 126 (H) 02/18/2020   LDLDIRECT 161.1 03/13/2012   TRIG 115.0 02/26/2021   CHOLHDL 4 02/26/2021   K 5.0 03/31/2021   BUN 8 03/31/2021   CREATININE 0.82 03/31/2021   CREATININE 0.73 02/18/2020    Past medical and surgical history  were reviewed and updated in EPIC.  Current Meds  Medication Sig   albuterol (VENTOLIN HFA) 108 (90 Base) MCG/ACT inhaler INHALE 2 PUFFS INTO THE LUNGS EVERY 6 HOURS AS NEEDED FOR WHEEZING OR SHORTNESS OF BREATH.   aspirin 81 MG tablet Take 81 mg by mouth daily.   budesonide-formoterol (SYMBICORT) 80-4.5 MCG/ACT inhaler Inhale 2 puffs into the lungs 2 (two) times daily as needed.   cholecalciferol (VITAMIN D) 1000 UNITS tablet Take 1,000 Units by mouth daily.   clonazePAM (KLONOPIN) 1 MG tablet 2 tabs for sleep as needed   metoprolol tartrate (LOPRESSOR) 100 MG tablet Take 1 tablet (100 mg total) by mouth once for 1 dose. Take TWO hours prior to CT procedure   Nebivolol HCl (BYSTOLIC) 20 MG TABS TAKE 1/2 TABLET (10 MG TOTAL) BY MOUTH DAILY   pantoprazole (PROTONIX) 40 MG tablet Take 1 tablet (40 mg total) by mouth daily.   phenazopyridine (PYRIDIUM) 200 MG tablet Take 1 tablet (200 mg total) by mouth 2 (two) times daily as needed.   triamcinolone (KENALOG) 0.1 % Apply 1 application topically as needed.    Allergies: Codeine, Compazine, Lunesta [eszopiclone], Merthiolate [thimerosal], Nickel, and Prochlorperazine edisylate  Social History   Tobacco Use   Smoking status: Former    Packs/day: 0.50    Types: Cigarettes    Quit date: 1992    Years since quitting: 30.9   Smokeless tobacco: Never  Vaping Use   Vaping Use: Never used  Substance Use Topics   Alcohol use: No   Drug use: No    Family History  Problem Relation Age of Onset  Heart failure Mother    Stroke Mother    Atrial fibrillation Mother    Heart attack Father    Heart failure Father    Diabetes Father    Hypertension Father    Heart attack Brother 33       half-brother   Atrial fibrillation Maternal Grandmother    Heart failure Maternal Grandmother        CHF   Hypertension Maternal Grandmother    Prostate cancer Maternal Grandfather    Pancreatitis Daughter    Heart disease Daughter 23       Restrictive  cardiomyopathy status post heart transplant    Review of Systems: A 12-system review of systems was performed and was negative except as noted in the HPI.  --------------------------------------------------------------------------------------------------  Physical Exam: BP 130/80 (BP Location: Left Arm, Patient Position: Sitting, Cuff Size: Large)    Pulse 77    Ht _0  (1.575 m)    Wt 187 lb (84.8 kg)    SpO2 97%    BMI 34.20 kg/m   General:  NAD. Neck: No JVD or HJR. Lungs: Clear to auscultation bilaterally without wheezes or crackles. Heart: Regular rate and rhythm without murmurs, rubs, or gallops. Abdomen: Soft, nontender, nondistended. Extremities: No lower extremity edema.  EKG: Normal sinus rhythm without abnormality.  Lab Results  Component Value Date   WBC 7.5 02/26/2021   HGB 14.2 02/26/2021   HCT 43.2 02/26/2021   MCV 88.6 02/26/2021   PLT 267.0 02/26/2021    Lab Results  Component Value Date   NA 145 (H) 03/31/2021   K 5.0 03/31/2021   CL 107 (H) 03/31/2021   CO2 25 03/31/2021   BUN 8 03/31/2021   CREATININE 0.82 03/31/2021   GLUCOSE 106 (H) 03/31/2021   ALT 11 02/26/2021    Lab Results  Component Value Date   CHOL 210 (H) 02/26/2021   HDL 47.60 02/26/2021   LDLCALC 140 (H) 02/26/2021   LDLDIRECT 161.1 03/13/2012   TRIG 115.0 02/26/2021   CHOLHDL 4 02/26/2021    --------------------------------------------------------------------------------------------------  ASSESSMENT AND PLAN: Palpitations: Similar to prior visits with some associated flushing but otherwise no worrisome symptoms.  Recent coronary CTA and labs were unrevealing.  Echocardiogram is scheduled for later Carrie Dalton.  We have agreed to obtain a 14-day event monitor for further characterization.  No medication changes recommended at this time.  Chest pain and shortness of breath: No further chest pain reported since our last visit with interval coronary CTA showing absence of CAD.   Minimal shortness of breath still noted.  We will obtain echocardiogram, as scheduled later Carrie Dalton, and aforementioned event monitor.  Hypertension: Blood pressure upper normal Carrie Dalton.  Continue current dose of nebivolol with ongoing management per Dr. Quay Burow.  Hyperlipidemia: Most recent LDL elevated at 140 in October.  Given absence of CAD, I think it is reasonable to continue with lifestyle modifications to help improve Carrie Dalton's lipid profile.  Follow-up: Return to clinic in 3 months.  Nelva Bush, MD 05/06/2021 2:20 PM

## 2021-05-07 ENCOUNTER — Other Ambulatory Visit: Payer: Medicare Other

## 2021-05-08 ENCOUNTER — Encounter: Payer: Self-pay | Admitting: Internal Medicine

## 2021-05-08 DIAGNOSIS — R002 Palpitations: Secondary | ICD-10-CM | POA: Insufficient documentation

## 2021-05-10 DIAGNOSIS — R002 Palpitations: Secondary | ICD-10-CM

## 2021-05-10 DIAGNOSIS — R079 Chest pain, unspecified: Secondary | ICD-10-CM | POA: Diagnosis not present

## 2021-05-14 ENCOUNTER — Other Ambulatory Visit: Payer: Medicare Other

## 2021-06-07 ENCOUNTER — Telehealth: Payer: Self-pay | Admitting: Internal Medicine

## 2021-06-07 NOTE — Telephone Encounter (Signed)
Nelva Bush, MD  06/07/2021  7:58 AM EST     Please let Ms. Donati know that her event monitor showed a few extra beats but no significant arrhythmia.  If she continues to be bothered by palpitations, we could try adding a low dose of metoprolol to see if her symptoms improve.  If symptoms are minimal, we can defer medication changes at this time and follow-up in the office at her convenience.

## 2021-06-07 NOTE — Telephone Encounter (Signed)
1st attempt to contact the patient. No answer- I left a message to please call back.   

## 2021-06-08 NOTE — Telephone Encounter (Signed)
Spoke with pt. Notified of event monitor results and Dr. Serita Kyle recc.  Pt voiced understanding.  Pt states that for now she will hold off on starting metoprolol.  Pt would like to wait on adding medication until her follow up 09/03/21.  Pt will let us know of any changes prior to her follow up.

## 2021-06-14 ENCOUNTER — Telehealth: Payer: Self-pay | Admitting: Internal Medicine

## 2021-06-14 MED ORDER — MOLNUPIRAVIR EUA 200MG CAPSULE: 4.0000 | ORAL_CAPSULE | Freq: Two times a day (BID) | ORAL | 0 refills | Status: AC

## 2021-06-14 NOTE — Telephone Encounter (Signed)
Covid positive We are sending molnupiravir

## 2021-06-25 ENCOUNTER — Ambulatory Visit: Payer: Medicare Other | Admitting: Internal Medicine

## 2021-08-03 ENCOUNTER — Telehealth: Payer: Self-pay

## 2021-08-03 ENCOUNTER — Other Ambulatory Visit: Payer: Self-pay

## 2021-08-03 MED ORDER — NEBIVOLOL HCL 20 MG PO TABS: ORAL_TABLET | ORAL | 1 refills | Status: DC

## 2021-08-03 NOTE — Telephone Encounter (Signed)
Pt is requesting a refill for: ?Generic Nebivolol HCl (BYSTOLIC) 20 MG TABS ? ?Pharmacy: ?[id number] - [address] [place] - [address] ? ?LOV 3/22 ?ROV 08/30/21 ?

## 2021-08-03 NOTE — Telephone Encounter (Signed)
Sent in today 

## 2021-08-05 ENCOUNTER — Other Ambulatory Visit: Payer: Self-pay | Admitting: Internal Medicine

## 2021-08-29 ENCOUNTER — Encounter: Payer: Self-pay | Admitting: Internal Medicine

## 2021-08-29 NOTE — Patient Instructions (Addendum)
? ? ? ?  Blood work was ordered.   ? ? ?Medications changes include :   none ? ? ? ?A mammogram and breast ultrasound was ordered.   ? ? ?Return in about 6 months (around 03/01/2022) for follow up. ? ?

## 2021-08-29 NOTE — Progress Notes (Signed)
? ? ? ? ?Subjective:  ? ? Patient ID: Carrie Dalton, female    DOB: 1956/03/09, 66 y.o.   MRN: [id number] ? ?This visit occurred during the SARS-CoV-2 public health emergency.  Safety protocols were in place, including screening questions prior to the visit, additional usage of staff PPE, and extensive cleaning of exam room while observing appropriate contact time as indicated for disinfecting solutions.   ? ? ?HPI ?Carrie Dalton is here for follow up of her chronic medical problems, including htn, gerd, osteopenia, prediabetes ? ?Has a little raised area in her right chest.  Noticed it about 4 months ago. No change.   It is tender at times, which she cannot tell if it is from her feeling the area or the area itself. ? ?Lost about 20 lbs - changed how she is eating after seeing her cardiologist, got sick and lost weight, and is exercising regularly.    She is walking regularly.  She thinks the weight loss is a combination of all of that.  She typically only eats once a day. ? ?She has not had any sweating episodes yet - typically only only has it in the summer.  Usually only an issue in summer.  ? ?Medications and allergies reviewed with patient and updated if appropriate. ? ?Current Outpatient Medications on File Prior to Visit  ?Medication Sig Dispense Refill  ? albuterol (VENTOLIN HFA) 108 (90 Base) MCG/ACT inhaler INHALE 2 PUFFS INTO THE LUNGS EVERY 6 HOURS AS NEEDED FOR WHEEZING OR SHORTNESS OF BREATH. 18 g 12  ? aspirin 81 MG tablet Take 81 mg by mouth daily.    ? budesonide-formoterol (SYMBICORT) 80-4.5 MCG/ACT inhaler Inhale 2 puffs into the lungs 2 (two) times daily as needed.    ? cholecalciferol (VITAMIN D) 1000 UNITS tablet Take 1,000 Units by mouth daily.    ? clonazePAM (KLONOPIN) 1 MG tablet 2 tabs for sleep as needed 60 tablet 5  ? Nebivolol HCl (BYSTOLIC) 20 MG TABS TAKE 1/2 TABLET (10 MG TOTAL) BY MOUTH DAILY 45 tablet 1  ? pantoprazole (PROTONIX) 40 MG tablet Take 1 tablet (40 mg total) by mouth daily. 90  tablet 3  ? phenazopyridine (PYRIDIUM) 200 MG tablet Take 1 tablet (200 mg total) by mouth 2 (two) times daily as needed. 60 tablet 11  ? triamcinolone (KENALOG) 0.1 % Apply 1 application topically as needed.    ? ?No current facility-administered medications on file prior to visit.  ? ? ? ?Review of Systems  ?Constitutional:  Negative for chills and fever.  ?Respiratory:  Negative for cough, shortness of breath and wheezing.   ?Cardiovascular:  Negative for chest pain, palpitations and leg swelling.  ?Neurological:  Positive for headaches (mild in the morning). Negative for light-headedness.  ? ?   ?Objective:  ? ?Vitals:  ? 08/30/21 1118  ?BP: 132/78  ?Pulse: (!) 55  ?Temp: 98 ?F (36.7 ?C)  ?SpO2: 97%  ? ?BP Readings from Last 3 Encounters:  ?08/30/21 132/78  ?05/06/21 130/80  ?04/12/21 (!) 143/74  ? ?Wt Readings from Last 3 Encounters:  ?08/30/21 170 lb 6 oz (77.3 kg)  ?05/06/21 187 lb (84.8 kg)  ?03/31/21 190 lb (86.2 kg)  ? ?Body mass index is 31.16 kg/m?. ? ?  ?Physical Exam ?Constitutional:   ?   General: She is not in acute distress. ?   Appearance: Normal appearance.  ?HENT:  ?   Head: Normocephalic and atraumatic.  ?Eyes:  ?   Conjunctiva/sclera: Conjunctivae normal.  ?Cardiovascular:  ?  Rate and Rhythm: Normal rate and regular rhythm.  ?   Heart sounds: Normal heart sounds. No murmur heard. ?Pulmonary:  ?   Effort: Pulmonary effort is normal. No respiratory distress.  ?   Breath sounds: Normal breath sounds. No wheezing.  ?Musculoskeletal:  ?   Cervical back: Neck supple.  ?   Right lower leg: No edema.  ?   Left lower leg: No edema.  ?Lymphadenopathy:  ?   Cervical: No cervical adenopathy.  ?Skin: ?   Findings: No rash.  ?Neurological:  ?   Mental Status: She is alert. Mental status is at baseline.  ?Psychiatric:     ?   Mood and Affect: Mood normal.     ?   Behavior: Behavior normal.  ? ?   ? ?Lab Results  ?Component Value Date  ? WBC 7.5 02/26/2021  ? HGB 14.2 02/26/2021  ? HCT 43.2 02/26/2021  ? PLT  267.0 02/26/2021  ? GLUCOSE 106 (H) 03/31/2021  ? CHOL 210 (H) 02/26/2021  ? TRIG 115.0 02/26/2021  ? HDL 47.60 02/26/2021  ? LDLDIRECT 161.1 03/13/2012  ? LDLCALC 140 (H) 02/26/2021  ? ALT 11 02/26/2021  ? AST 14 02/26/2021  ? NA 145 (H) 03/31/2021  ? K 5.0 03/31/2021  ? CL 107 (H) 03/31/2021  ? CREATININE 0.82 03/31/2021  ? BUN 8 03/31/2021  ? CO2 25 03/31/2021  ? TSH 0.92 02/26/2021  ? PSA WNL 03/24/2014  ? HGBA1C 5.7 02/26/2021  ? ? ? ?Assessment & Plan:  ? ? ?See Problem List for Assessment and Plan of chronic medical problems.  ? ? ?

## 2021-08-30 ENCOUNTER — Ambulatory Visit (INDEPENDENT_AMBULATORY_CARE_PROVIDER_SITE_OTHER): Payer: Medicare Other | Admitting: Internal Medicine

## 2021-08-30 VITALS — BP 132/78 | HR 55 | Temp 98.0°F | Ht 62.0 in | Wt 170.4 lb

## 2021-08-30 DIAGNOSIS — K219 Gastro-esophageal reflux disease without esophagitis: Secondary | ICD-10-CM

## 2021-08-30 DIAGNOSIS — I10 Essential (primary) hypertension: Secondary | ICD-10-CM | POA: Diagnosis not present

## 2021-08-30 DIAGNOSIS — R7303 Prediabetes: Secondary | ICD-10-CM | POA: Diagnosis not present

## 2021-08-30 DIAGNOSIS — N644 Mastodynia: Secondary | ICD-10-CM

## 2021-08-30 DIAGNOSIS — E7849 Other hyperlipidemia: Secondary | ICD-10-CM | POA: Diagnosis not present

## 2021-08-30 LAB — COMPREHENSIVE METABOLIC PANEL
ALT: 10 U/L (ref 0–35)
AST: 13 U/L (ref 0–37)
Albumin: 4.3 g/dL (ref 3.5–5.2)
Alkaline Phosphatase: 66 U/L (ref 39–117)
BUN: 10 mg/dL (ref 6–23)
CO2: 29 mEq/L (ref 19–32)
Calcium: 9.8 mg/dL (ref 8.4–10.5)
Chloride: 104 mEq/L (ref 96–112)
Creatinine, Ser: 0.79 mg/dL (ref 0.40–1.20)
GFR: 77.83 mL/min (ref >60.00)
Glucose, Bld: 105 mg/dL — ABNORMAL HIGH (ref 70–99)
Potassium: 4.6 mEq/L (ref 3.5–5.1)
Sodium: 139 mEq/L (ref 135–145)
Total Bilirubin: 0.5 mg/dL (ref 0.2–1.2)
Total Protein: 6.6 g/dL (ref 6.0–8.3)

## 2021-08-30 LAB — LIPID PANEL
Cholesterol: 181 mg/dL (ref 0–200)
HDL: 49.5 mg/dL (ref >39.00)
LDL Cholesterol: 115 mg/dL — ABNORMAL HIGH (ref 0–99)
NonHDL: 131.41
Total CHOL/HDL Ratio: 4
Triglycerides: 81 mg/dL (ref 0.0–149.0)
VLDL: 16.2 mg/dL (ref 0.0–40.0)

## 2021-08-30 LAB — HEMOGLOBIN A1C: Hgb A1c MFr Bld: 5.5 % (ref 4.6–6.5)

## 2021-08-30 NOTE — Assessment & Plan Note (Signed)
Chronic Blood pressure well controlled CMP Continue Bystolic 10 mg daily 

## 2021-08-30 NOTE — Assessment & Plan Note (Signed)
Chronic ?Has been walking, eating differently and has lost a significant amount of weight ?Cholesterol hopefully has improved ?Coronary artery calcium score is 0 ?Has never been on a statin and would prefer to not be on 1 ?

## 2021-08-30 NOTE — Assessment & Plan Note (Signed)
Chronic ?GERD controlled ?Continue pantoprazole 40 mg daily, but since GERD is well controlled advised her to try every other day dosing and see if she tolerates it-can try to taper down on dose is much as possible ?

## 2021-08-30 NOTE — Assessment & Plan Note (Signed)
Chronic Check a1c Low sugar / carb diet Stressed regular exercise  

## 2021-09-03 ENCOUNTER — Ambulatory Visit (INDEPENDENT_AMBULATORY_CARE_PROVIDER_SITE_OTHER): Payer: Medicare Other | Admitting: Internal Medicine

## 2021-09-03 ENCOUNTER — Encounter: Payer: Self-pay | Admitting: Internal Medicine

## 2021-09-03 VITALS — BP 140/74 | HR 76 | Ht 62.0 in | Wt 173.0 lb

## 2021-09-03 DIAGNOSIS — R079 Chest pain, unspecified: Secondary | ICD-10-CM

## 2021-09-03 DIAGNOSIS — I4729 Other ventricular tachycardia: Secondary | ICD-10-CM | POA: Diagnosis not present

## 2021-09-03 DIAGNOSIS — I10 Essential (primary) hypertension: Secondary | ICD-10-CM | POA: Diagnosis not present

## 2021-09-03 DIAGNOSIS — I471 Supraventricular tachycardia: Secondary | ICD-10-CM

## 2021-09-03 NOTE — Patient Instructions (Signed)
Medication Instructions:  ? ?Your physician recommends that you continue on your current medications as directed. Please refer to the Current Medication list given to you today. ? ?You may stop your Aspirin if you choose to ? ?*If you need a refill on your cardiac medications before your next appointment, please call your pharmacy* ? ? ?Lab Work: ? ?None ordered ? ?Testing/Procedures: ? ?None ordered ? ? ?Follow-Up: ?At [facility], you and your health needs are our priority.  As part of our continuing mission to provide you with exceptional heart care, we have created designated Provider Care Teams.  These Care Teams include your primary Cardiologist (physician) and Advanced Practice Providers (APPs -  Physician Assistants and Nurse Practitioners) who all work together to provide you with the care you need, when you need it. ? ?We recommend signing up for the patient portal called "MyChart".  Sign up information is provided on this After Visit Summary.  MyChart is used to connect with patients for Virtual Visits (Telemedicine).  Patients are able to view lab/test results, encounter notes, upcoming appointments, etc.  Non-urgent messages can be sent to your provider as well.   ?To learn more about what you can do with MyChart, go to [email or web address].   ? ?Your next appointment:   ?1 year(s) ? ?The format for your next appointment:   ?In Person ? ?Provider:   ?You may see Yvonne Kendall, MD or one of the following Advanced Practice Providers on your designated Care Team:   ?Nicolasa Ducking, NP ?Eula Listen, PA-C ?Cadence Fransico Michael, PA-C ? ?Important Information About Sugar ? ? ? ? ?  ? ?

## 2021-09-03 NOTE — Progress Notes (Signed)
? ?Follow-up Outpatient Visit ?Date: 09/03/2021 ? ?Primary Care Provider: ?Pincus Sanes, MD ?[address] ?[place] [address] ? ?Chief Complaint: Follow-up chest pain and palpitations ? ?HPI:  Carrie Dalton is a 66 y.o. female with history of hypertension, hyperlipidemia, prediabetes, asthma, GERD, and osteopenia, who presents for follow-up of palpitations and chest pain.  I last saw her in 04/2021, at which time she continued to note occasional flutters in her chest as well as flushing.  Prior coronary CTA (03/2021) was normal.  We agreed to obtain an echocardiogram and 14-day event monitor.  Echo was normal other than grade 1 diastolic dysfunction.  Monitor showed rare PACs and PVCs as well as brief episodes of PSVT and NSVT lasting up to 10 beats. ? ?Today, Carrie Dalton reports that she has been feeling well. She has not had any further flushing episodes or chest pain.  She also denies palpitations, edema, and lightheadedness.  She has lost ~15 pounds since December through diet and exercise.  She is tolerating her medications well. ? ?-------------------------------------------------------------------------------------------------- ? ?Cardiovascular History & Procedures: ?Cardiovascular Problems: ?Chest tightness ?Flushing and diaphoresis ?  ?Risk Factors: ?Hypertension, hyperlipidemia, prediabetes, obesity, prior tobacco use, and age greater than 64 ?  ?Cath/PCI: ?None ?  ?CV Surgery: ?None ?  ?EP Procedures and Devices: ?Event monitor (05/06/2021): Predominantly sinus rhythm with rare PACs and PVCs.  Brief episodes of PSVT and NSVT noted, lasting up to 10 beats and 7 beats, respectively. ?  ?Non-Invasive Evaluation(s): ?TTE (05/06/2021): Normal LV size and wall thickness.  LVEF 55-60% with grade 1 diastolic dysfunction.  Normal RV size and function.  Normal biatrial size.  Trivial mitral regurgitation.  No significant valvular abnormalities. ?Coronary CTA (04/12/2021): Normal study without CAD.  CAC 0.  No  significant extracardiac findings. ? ?Recent CV Pertinent Labs: ?Lab Results  ?Component Value Date  ? CHOL 181 08/30/2021  ? HDL 49.50 08/30/2021  ? LDLCALC 115 (H) 08/30/2021  ? LDLCALC 126 (H) 02/18/2020  ? LDLDIRECT 161.1 03/13/2012  ? TRIG 81.0 08/30/2021  ? CHOLHDL 4 08/30/2021  ? K 4.6 08/30/2021  ? BUN 10 08/30/2021  ? BUN 8 03/31/2021  ? CREATININE 0.79 08/30/2021  ? CREATININE 0.73 02/18/2020  ? ? ?Past medical and surgical history were reviewed and updated in EPIC. ? ?Current Meds  ?Medication Sig  ? albuterol (VENTOLIN HFA) 108 (90 Base) MCG/ACT inhaler INHALE 2 PUFFS INTO THE LUNGS EVERY 6 HOURS AS NEEDED FOR WHEEZING OR SHORTNESS OF BREATH.  ? aspirin 81 MG tablet Take 81 mg by mouth daily.  ? budesonide-formoterol (SYMBICORT) 80-4.5 MCG/ACT inhaler Inhale 2 puffs into the lungs 2 (two) times daily as needed.  ? cholecalciferol (VITAMIN D) 1000 UNITS tablet Take 1,000 Units by mouth daily.  ? clonazePAM (KLONOPIN) 1 MG tablet 2 tabs for sleep as needed  ? Nebivolol HCl (BYSTOLIC) 20 MG TABS TAKE 1/2 TABLET (10 MG TOTAL) BY MOUTH DAILY  ? pantoprazole (PROTONIX) 40 MG tablet Take 1 tablet (40 mg total) by mouth daily.  ? phenazopyridine (PYRIDIUM) 200 MG tablet Take 1 tablet (200 mg total) by mouth 2 (two) times daily as needed.  ? triamcinolone (KENALOG) 0.1 % Apply 1 application topically as needed.  ? ? ?Allergies: Codeine, Compazine, Lunesta [eszopiclone], Merthiolate [thimerosal], Nickel, and Prochlorperazine edisylate ? ?Social History  ? ?Tobacco Use  ? Smoking status: Former  ?  Packs/day: 0.50  ?  Types: Cigarettes  ?  Quit date: 35  ?  Years since quitting: 31.3  ?  Smokeless tobacco: Never  ?Vaping Use  ? Vaping Use: Never used  ?Substance Use Topics  ? Alcohol use: No  ? Drug use: No  ? ? ?Family History  ?Problem Relation Age of Onset  ? Heart failure Mother   ? Stroke Mother   ? Atrial fibrillation Mother   ? Heart attack Father   ? Heart failure Father   ? Diabetes Father   ?  Hypertension Father   ? Heart attack Brother 60  ?     half-brother  ? Atrial fibrillation Maternal Grandmother   ? Heart failure Maternal Grandmother   ?     CHF  ? Hypertension Maternal Grandmother   ? Prostate cancer Maternal Grandfather   ? Pancreatitis Daughter   ? Heart disease Daughter 43  ?     Restrictive cardiomyopathy status post heart transplant  ? ? ?Review of Systems: ?A 12-system review of systems was performed and was negative except as noted in the HPI. ? ?-------------------------------------------------------------------------------------------------- ? ?Physical Exam: ?BP 140/74 (BP Location: Left Arm, Patient Position: Sitting, Cuff Size: Large)   Pulse 76   Ht 5\' 2"  (1.575 m)   Wt 173 lb (78.5 kg)   SpO2 98%   BMI 31.64 kg/m?  ? ?General:  NAD. ?Neck: No JVD or HJR. ?Lungs: Clear to auscultation bilaterally without wheezes or crackles. ?Heart: Regular rate and rhythm without murmurs, rubs, or gallops. ?Abdomen: Soft, nontender, nondistended. ?Extremities: No lower extremity edema. ? ?Lab Results  ?Component Value Date  ? WBC 7.5 02/26/2021  ? HGB 14.2 02/26/2021  ? HCT 43.2 02/26/2021  ? MCV 88.6 02/26/2021  ? PLT 267.0 02/26/2021  ? ? ?Lab Results  ?Component Value Date  ? NA 139 08/30/2021  ? K 4.6 08/30/2021  ? CL 104 08/30/2021  ? CO2 29 08/30/2021  ? BUN 10 08/30/2021  ? CREATININE 0.79 08/30/2021  ? GLUCOSE 105 (H) 08/30/2021  ? ALT 10 08/30/2021  ? ? ?Lab Results  ?Component Value Date  ? CHOL 181 08/30/2021  ? HDL 49.50 08/30/2021  ? LDLCALC 115 (H) 08/30/2021  ? LDLDIRECT 161.1 03/13/2012  ? TRIG 81.0 08/30/2021  ? CHOLHDL 4 08/30/2021  ? ? ?-------------------------------------------------------------------------------------------------- ? ?ASSESSMENT AND PLAN: ?PSVT and NSVT: ?Brief episodes noted on event monitor.  Given lack of further palpitations, as well as reassuring echo and coronary CTA, we will defer further testing.  Carrie Dalton can continue current dose of  nebivolol. ? ?Hypertension: ?BP mildly elevated today but better at recent PCP visit.  No medication changes recommended today.  Continue weight loss.  Ongoing management per Dr. Allison Quarry. ? ?Chest pain: ?No further chest pain reported.  Coronary CTA without any plaque/stenosis.  It is reasonable to consider discontinuation of aspirin; I will defer this decision to Carrie Dalton.  No further workup recommended at this time. ? ?Follow-up: Return to clinic in 1 year. ? ?Lawerance Bach, MD ?09/03/2021 ?9:52 AM ? ?

## 2021-09-07 ENCOUNTER — Other Ambulatory Visit: Payer: Self-pay | Admitting: Internal Medicine

## 2021-09-07 NOTE — Addendum Note (Signed)
Addended by: Pincus Sanes on: 09/07/2021 07:12 AM ? ? Modules accepted: Orders ? ?

## 2021-10-05 ENCOUNTER — Telehealth: Payer: Self-pay | Admitting: Internal Medicine

## 2021-10-05 MED ORDER — CLONAZEPAM 1 MG PO TABS: ORAL_TABLET | ORAL | 5 refills | Status: DC

## 2021-10-05 NOTE — Telephone Encounter (Signed)
Asks refill clonazepam to [facility] [place] ?

## 2021-10-07 ENCOUNTER — Ambulatory Visit
Admission: RE | Admit: 2021-10-07 | Discharge: 2021-10-07 | Disposition: A | Discharge status: Home / Self Care | Payer: Medicare Other | Source: Ambulatory Visit | Attending: Internal Medicine | Admitting: Internal Medicine

## 2021-10-07 ENCOUNTER — Other Ambulatory Visit: Payer: Self-pay | Admitting: Internal Medicine

## 2021-10-07 DIAGNOSIS — N644 Mastodynia: Secondary | ICD-10-CM

## 2021-10-27 ENCOUNTER — Telehealth: Payer: Self-pay

## 2021-10-27 ENCOUNTER — Ambulatory Visit: Payer: Medicare Other

## 2021-10-27 NOTE — Telephone Encounter (Signed)
Scheduled for [facility]V-I telephone at 12:30 pm.  Mailbox full.

## 2021-11-03 ENCOUNTER — Ambulatory Visit (INDEPENDENT_AMBULATORY_CARE_PROVIDER_SITE_OTHER): Payer: Medicare Other | Admitting: *Deleted

## 2021-11-03 DIAGNOSIS — Z0000 Encounter for general adult medical examination without abnormal findings: Secondary | ICD-10-CM

## 2021-11-03 NOTE — Progress Notes (Signed)
Subjective:   Carrie Dalton is a 66 y.o. female who presents for an Initial Medicare Annual Wellness Visit.  I connected with  Carrie Dalton on 11/03/21 by a telephone enabled telemedicine application and verified that I am speaking with the correct person using two identifiers.   I discussed the limitations of evaluation and management by telemedicine. The patient expressed understanding and agreed to proceed.  Patient location: home  Provider location: Tele-health- home     Review of Systems     Cardiac Risk Factors include: advanced age (>61men, >50 women);obesity (BMI >30kg/m2);hypertension     Objective:    There were no vitals filed for this visit. There is no height or weight on file to calculate BMI.     11/03/2021   12:07 PM 08/29/2017    3:13 PM 05/09/2017    8:42 AM 05/04/2017   11:14 AM  Advanced Directives  Does Patient Have a Medical Advance Directive? No No No No  Would patient like information on creating a medical advance directive? No - Patient declined No - Patient declined No - Patient declined     Current Medications (verified) Outpatient Encounter Medications as of 11/03/2021  Medication Sig   albuterol (VENTOLIN HFA) 108 (90 Base) MCG/ACT inhaler INHALE 2 PUFFS INTO THE LUNGS EVERY 6 HOURS AS NEEDED FOR WHEEZING OR SHORTNESS OF BREATH.   cholecalciferol (VITAMIN D) 1000 UNITS tablet Take 1,000 Units by mouth daily.   clonazePAM (KLONOPIN) 1 MG tablet 2 tabs for sleep as needed   Nebivolol HCl (BYSTOLIC) 20 MG TABS TAKE 1/2 TABLET (10 MG TOTAL) BY MOUTH DAILY   pantoprazole (PROTONIX) 40 MG tablet Take 1 tablet (40 mg total) by mouth daily.   phenazopyridine (PYRIDIUM) 200 MG tablet Take 1 tablet (200 mg total) by mouth 2 (two) times daily as needed.   triamcinolone (KENALOG) 0.1 % Apply 1 application topically as needed.   aspirin 81 MG tablet Take 81 mg by mouth daily.   budesonide-formoterol (SYMBICORT) 80-4.5 MCG/ACT inhaler Inhale 2 puffs into  the lungs 2 (two) times daily as needed. (Patient not taking: Reported on 11/03/2021)   No facility-administered encounter medications on file as of 11/03/2021.    Allergies (verified) Codeine, Compazine, Lunesta [eszopiclone], Merthiolate [thimerosal], Nickel, and Prochlorperazine edisylate   History: Past Medical History:  Diagnosis Date   ASTHMA, UNSPECIFIED, UNSPECIFIED STATUS    Closed fracture of right distal fibula 05/09/2017   DEGENERATIVE JOINT DISEASE    Family history of adverse reaction to anesthesia    ponv   Hyperlipidemia    Hypertension    NECK PAIN 11/05/2009   OSTEOPENIA    POSITIVE PPD    SLEEP DISORDER, HX OF    UNSPECIFIED PAROXYSMAL TACHYCARDIA    Past Surgical History:  Procedure Laterality Date   CESAREAN SECTION  1984   DILATION AND CURETTAGE OF UTERUS     x 3   ORIF FIBULA FRACTURE Right 05/09/2017   Procedure: OPEN REDUCTION INTERNAL FIXATION (ORIF) RIGHT DISTAL FIBULA FRACTURE;  Surgeon: Teryl Lucy, MD;  Location: [facility];  Service: Orthopedics;  Laterality: Right;   Family History  Problem Relation Age of Onset   Heart failure Mother    Stroke Mother    Atrial fibrillation Mother    Heart attack Father    Heart failure Father    Diabetes Father    Hypertension Father    Pancreatitis Daughter    Heart disease Daughter 51  Restrictive cardiomyopathy status post heart transplant   Atrial fibrillation Maternal Grandmother    Heart failure Maternal Grandmother        CHF   Hypertension Maternal Grandmother    Prostate cancer Maternal Grandfather    Heart attack Brother 74       half-brother   Breast cancer Neg Hx    Social History   Socioeconomic History   Marital status: Married    Spouse name: Not on file   Number of children: 3   Years of education: Not on file   Highest education level: Not on file  Occupational History   Occupation: PATIENT CARE COORDIN    Employer: [employer]  Tobacco Use    Smoking status: Former    Packs/day: 0.50    Types: Cigarettes    Quit date: 1992    Years since quitting: 31.4   Smokeless tobacco: Never  Vaping Use   Vaping Use: Never used  Substance and Sexual Activity   Alcohol use: No   Drug use: No   Sexual activity: Not Currently  Other Topics Concern   Not on file  Social History Narrative   Not on file   Social Determinants of Health   Financial Resource Strain: Low Risk  (11/03/2021)   Overall Financial Resource Strain (CARDIA)    Difficulty of Paying Living Expenses: Not hard at all  Food Insecurity: No Food Insecurity (11/03/2021)   Hunger Vital Sign    Worried About Running Out of Food in the Last Year: Never true    Ran Out of Food in the Last Year: Never true  Transportation Needs: No Transportation Needs (11/03/2021)   PRAPARE - Administrator, Civil Service (Medical): No    Lack of Transportation (Non-Medical): No  Physical Activity: Sufficiently Active (11/03/2021)   Exercise Vital Sign    Days of Exercise per Week: 4 days    Minutes of Exercise per Session: 40 min  Stress: No Stress Concern Present (11/03/2021)   [employer] of Occupational Health - Occupational Stress Questionnaire    Feeling of Stress : Not at all  Social Connections: Socially Integrated (11/03/2021)   Social Connection and Isolation Panel [NHANES]    Frequency of Communication with Friends and Family: More than three times a week    Frequency of Social Gatherings with Friends and Family: More than three times a week    Attends Religious Services: More than 4 times per year    Active Member of [employer] or Organizations: Yes    Attends Engineer, structural: More than 4 times per year    Marital Status: Married    Tobacco Counseling Counseling given: Not Answered   Clinical Intake:  Pre-visit preparation completed: Yes  Pain : No/denies pain        How often do you need to have someone help you when you read  instructions, pamphlets, or other written materials from your doctor or pharmacy?: 1 - Never  Diabetic?  no  Interpreter Needed?: No  Information entered by :: Remi Haggard LPN   Activities of Daily Living    11/03/2021   12:07 PM  In your present state of health, do you have any difficulty performing the following activities:  Hearing? 0  Vision? 0  Difficulty concentrating or making decisions? 0  Walking or climbing stairs? 0  Dressing or bathing? 0  Doing errands, shopping? 0  Preparing Food and eating ? N  Using the Toilet? N  In  the past six months, have you accidently leaked urine? N  Do you have problems with loss of bowel control? N  Managing your Medications? N  Managing your Finances? N  Housekeeping or managing your Housekeeping? N    Patient Care Team: Pincus SanesBurns, Stacy J, MD as PCP - General (Internal Medicine) End, Cristal Deerhristopher, MD as PCP - Cardiology (Cardiology) Rachael FeeJacobs, Daniel P, MD (Gastroenterology) Waymon BudgeYoung, Clinton D, MD (Pulmonary Disease) Huel Coteichardson, Kathy, MD (Obstetrics and Gynecology)  Indicate any recent Medical Services you may have received from other than [facility] providers in the past year (date may be approximate).     Assessment:   This is a routine wellness examination for Carrie Dalton.  Hearing/Vision screen Hearing Screening - Comments:: No trouble hearing Vision Screening - Comments:: Up to date [facility]  [address]  Dietary issues and exercise activities discussed: Current Exercise Habits: Home exercise routine, Type of exercise: walking, Time (Minutes): 40, Frequency (Times/Week): 3, Weekly Exercise (Minutes/Week): 120, Intensity: Mild   Goals Addressed             This Visit's Progress    Increase physical activity       Loose  weight       Depression Screen    11/03/2021   12:09 PM 08/30/2021   11:21 AM 07/27/2020   12:38 PM 07/26/2019    9:43 AM 07/19/2018    8:02 AM 03/29/2017    9:49 AM  PHQ 2/9 Scores  PHQ - 2 Score 0 0 0 0 0 0   PHQ- 9 Score   1       Fall Risk    11/03/2021   12:04 PM 08/30/2021   11:21 AM 07/27/2020   10:43 AM 07/26/2019    9:43 AM 07/19/2018    8:02 AM  Fall Risk   Falls in the past year? 0 0 0 0 0  Number falls in past yr: 0  0 0   Injury with Fall? 0  0 0   Risk for fall due to :   No Fall Risks    Follow up Education provided;Falls prevention discussed;Falls evaluation completed  Falls evaluation completed      FALL RISK PREVENTION PERTAINING TO THE HOME:  Any stairs in or around the home? Yes  If so, are there any without handrails? No  Home free of loose throw rugs in walkways, pet beds, electrical cords, etc? Yes  Adequate lighting in your home to reduce risk of falls? Yes   ASSISTIVE DEVICES UTILIZED TO PREVENT FALLS:  Life alert? No  Use of a cane, walker or w/c? No  Grab bars in the bathroom? No  Shower chair or bench in shower? Yes  Elevated toilet seat or a handicapped toilet? No   TIMED UP AND GO:  Was the test performed? No .    Cognitive Function:        11/03/2021   12:05 PM  6CIT Screen  What Year? 0 points  What month? 0 points  What time? 0 points  Count back from 20 0 points  Months in reverse 0 points  Repeat phrase 0 points  Total Score 0 points    Immunizations Immunization History  Administered Date(s) Administered   Influenza Split 02/21/2012   Influenza,inj,Quad PF,6+ Mos 02/20/2013   Influenza-Unspecified 02/17/2015, 02/16/2016, 02/22/2017   PFIZER(Purple Top)SARS-COV-2 Vaccination 06/04/2019, 06/25/2019   PNEUMOCOCCAL CONJUGATE-20 07/27/2020   Pneumococcal Polysaccharide-23 11/05/2013   Td 08/28/2008   Tdap 01/11/2016   Zoster Recombinat (Shingrix) 09/14/2017, 07/26/2019  Zoster, Live 02/17/2015    TDAP status: Up to date  Flu Vaccine status: Due, Education has been provided regarding the importance of this vaccine. Advised may receive this vaccine at local pharmacy or Health Dept. Aware to provide a copy of the vaccination  record if obtained from local pharmacy or Health Dept. Verbalized acceptance and understanding.  Pneumococcal vaccine status: Up to date  Covid-19 vaccine status: Information provided on how to obtain vaccines.   Qualifies for Shingles Vaccine? No   Zostavax completed Yes   Shingrix Completed?: Yes  Screening Tests Health Maintenance  Topic Date Due   COVID-19 Vaccine (3 - Pfizer series) 11/19/2021 (Originally 08/20/2019)   COLONOSCOPY (Pts 45-40yrs Insurance coverage will need to be confirmed)  02/26/2022 (Originally 01/12/2000)   MAMMOGRAM  12/17/2021   INFLUENZA VACCINE  12/21/2021   DEXA SCAN  08/06/2022   TETANUS/TDAP  01/10/2026   Pneumonia Vaccine 82+ Years old  Completed   Hepatitis C Screening  Completed   Zoster Vaccines- Shingrix  Completed   HPV VACCINES  Aged Out    Health Maintenance  There are no preventive care reminders to display for this patient.   Colonoscopy declined  Mammogram status: Completed  . Repeat every year  Bone Density status: Completed 2022. Results reflect: Bone density results: OSTEOPENIA. Repeat every 2 years.  Lung Cancer Screening: (Low Dose CT Chest recommended if Age 74-80 years, 30 pack-year currently smoking OR have quit w/in 15years.) does not qualify.   Lung Cancer Screening Referral:   Additional Screening:  Hepatitis C Screening: does not qualify; Completed 2019  Vision Screening: Recommended annual ophthalmology exams for early detection of glaucoma and other disorders of the eye. Is the patient up to date with their annual eye exam?  Yes  Who is the provider or what is the name of the office in which the patient attends annual eye exams? [facility] If pt is not established with a provider, would they like to be referred to a provider to establish care? No .   Dental Screening: Recommended annual dental exams for proper oral hygiene  Community Resource Referral / Chronic Care Management: CRR required this visit?  No   CCM  required this visit?  No      Plan:     I have personally reviewed and noted the following in the patient's chart:   Medical and social history Use of alcohol, tobacco or illicit drugs  Current medications and supplements including opioid prescriptions. Patient is not currently taking opioid prescriptions. Functional ability and status Nutritional status Physical activity Advanced directives List of other physicians Hospitalizations, surgeries, and ER visits in previous 12 months Vitals Screenings to include cognitive, depression, and falls Referrals and appointments  In addition, I have reviewed and discussed with patient certain preventive protocols, quality metrics, and best practice recommendations. A written personalized care plan for preventive services as well as general preventive health recommendations were provided to patient.     Remi Haggard, LPN   [id number]   Nurse Notes:

## 2021-11-03 NOTE — Patient Instructions (Addendum)
Ms. Carrie Dalton , Thank you for taking time to come for your Medicare Wellness Visit. I appreciate your ongoing commitment to your health goals. Please review the following plan we discussed and let me know if I can assist you in the future.   Screening recommendations/referrals: Colonoscopy: Education provided postponed Mammogram: up to date Bone Density: up to date Recommended yearly ophthalmology/optometry visit for glaucoma screening and checkup Recommended yearly dental visit for hygiene and checkup  Vaccinations: Influenza vaccine: Education provided Pneumococcal vaccine: up to date Tdap vaccine: up to date Shingles vaccine: up to date    Advanced directives: Education provided  Conditions/risks identified:   Next appointment: 03-07-2022 @ 11:00  Burns   Preventive Care 65 Years and Older, Female Preventive care refers to lifestyle choices and visits with your health care provider that can promote health and wellness. What does preventive care include? A yearly physical exam. This is also called an annual well check. Dental exams once or twice a year. Routine eye exams. Ask your health care provider how often you should have your eyes checked. Personal lifestyle choices, including: Daily care of your teeth and gums. Regular physical activity. Eating a healthy diet. Avoiding tobacco and drug use. Limiting alcohol use. Practicing safe sex. Taking low-dose aspirin every day. Taking vitamin and mineral supplements as recommended by your health care provider. What happens during an annual well check? The services and screenings done by your health care provider during your annual well check will depend on your age, overall health, lifestyle risk factors, and family history of disease. Counseling  Your health care provider may ask you questions about your: Alcohol use. Tobacco use. Drug use. Emotional well-being. Home and relationship well-being. Sexual activity. Eating  habits. History of falls. Memory and ability to understand (cognition). Work and work Astronomer. Reproductive health. Screening  You may have the following tests or measurements: Height, weight, and BMI. Blood pressure. Lipid and cholesterol levels. These may be checked every 5 years, or more frequently if you are over 62 years old. Skin check. Lung cancer screening. You may have this screening every year starting at age 38 if you have a 30-pack-year history of smoking and currently smoke or have quit within the past 15 years. Fecal occult blood test (FOBT) of the stool. You may have this test every year starting at age 30. Flexible sigmoidoscopy or colonoscopy. You may have a sigmoidoscopy every 5 years or a colonoscopy every 10 years starting at age 73. Hepatitis C blood test. Hepatitis B blood test. Sexually transmitted disease (STD) testing. Diabetes screening. This is done by checking your blood sugar (glucose) after you have not eaten for a while (fasting). You may have this done every 1-3 years. Bone density scan. This is done to screen for osteoporosis. You may have this done starting at age 49. Mammogram. This may be done every 1-2 years. Talk to your health care provider about how often you should have regular mammograms. Talk with your health care provider about your test results, treatment options, and if necessary, the need for more tests. Vaccines  Your health care provider may recommend certain vaccines, such as: Influenza vaccine. This is recommended every year. Tetanus, diphtheria, and acellular pertussis (Tdap, Td) vaccine. You may need a Td booster every 10 years. Zoster vaccine. You may need this after age 39. Pneumococcal 13-valent conjugate (PCV13) vaccine. One dose is recommended after age 67. Pneumococcal polysaccharide (PPSV23) vaccine. One dose is recommended after age 58. Talk to your health care  provider about which screenings and vaccines you need and how  often you need them. This information is not intended to replace advice given to you by your health care provider. Make sure you discuss any questions you have with your health care provider. Document Released: 06/05/2015 Document Revised: 01/27/2016 Document Reviewed: 03/10/2015 Elsevier Interactive Patient Education  2017 [place] Prevention in the Home Falls can cause injuries. They can happen to people of all ages. There are many things you can do to make your home safe and to help prevent falls. What can I do on the outside of my home? Regularly fix the edges of walkways and driveways and fix any cracks. Remove anything that might make you trip as you walk through a door, such as a raised step or threshold. Trim any bushes or trees on the path to your home. Use bright outdoor lighting. Clear any walking paths of anything that might make someone trip, such as rocks or tools. Regularly check to see if handrails are loose or broken. Make sure that both sides of any steps have handrails. Any raised decks and porches should have guardrails on the edges. Have any leaves, snow, or ice cleared regularly. Use sand or salt on walking paths during winter. Clean up any spills in your garage right away. This includes oil or grease spills. What can I do in the bathroom? Use night lights. Install grab bars by the toilet and in the tub and shower. Do not use towel bars as grab bars. Use non-skid mats or decals in the tub or shower. If you need to sit down in the shower, use a plastic, non-slip stool. Keep the floor dry. Clean up any water that spills on the floor as soon as it happens. Remove soap buildup in the tub or shower regularly. Attach bath mats securely with double-sided non-slip rug tape. Do not have throw rugs and other things on the floor that can make you trip. What can I do in the bedroom? Use night lights. Make sure that you have a light by your bed that is easy to  reach. Do not use any sheets or blankets that are too big for your bed. They should not hang down onto the floor. Have a firm chair that has side arms. You can use this for support while you get dressed. Do not have throw rugs and other things on the floor that can make you trip. What can I do in the kitchen? Clean up any spills right away. Avoid walking on wet floors. Keep items that you use a lot in easy-to-reach places. If you need to reach something above you, use a strong step stool that has a grab bar. Keep electrical cords out of the way. Do not use floor polish or wax that makes floors slippery. If you must use wax, use non-skid floor wax. Do not have throw rugs and other things on the floor that can make you trip. What can I do with my stairs? Do not leave any items on the stairs. Make sure that there are handrails on both sides of the stairs and use them. Fix handrails that are broken or loose. Make sure that handrails are as long as the stairways. Check any carpeting to make sure that it is firmly attached to the stairs. Fix any carpet that is loose or worn. Avoid having throw rugs at the top or bottom of the stairs. If you do have throw rugs, attach them to the  floor with carpet tape. Make sure that you have a light switch at the top of the stairs and the bottom of the stairs. If you do not have them, ask someone to add them for you. What else can I do to help prevent falls? Wear shoes that: Do not have high heels. Have rubber bottoms. Are comfortable and fit you well. Are closed at the toe. Do not wear sandals. If you use a stepladder: Make sure that it is fully opened. Do not climb a closed stepladder. Make sure that both sides of the stepladder are locked into place. Ask someone to hold it for you, if possible. Clearly mark and make sure that you can see: Any grab bars or handrails. First and last steps. Where the edge of each step is. Use tools that help you move  around (mobility aids) if they are needed. These include: Canes. Walkers. Scooters. Crutches. Turn on the lights when you go into a dark area. Replace any light bulbs as soon as they burn out. Set up your furniture so you have a clear path. Avoid moving your furniture around. If any of your floors are uneven, fix them. If there are any pets around you, be aware of where they are. Review your medicines with your doctor. Some medicines can make you feel dizzy. This can increase your chance of falling. Ask your doctor what other things that you can do to help prevent falls. This information is not intended to replace advice given to you by your health care provider. Make sure you discuss any questions you have with your health care provider. Document Released: 03/05/2009 Document Revised: 10/15/2015 Document Reviewed: 06/13/2014 Elsevier Interactive Patient Education  2017 [employer].

## 2022-01-22 ENCOUNTER — Other Ambulatory Visit: Payer: Self-pay | Admitting: Internal Medicine

## 2022-02-21 ENCOUNTER — Other Ambulatory Visit: Payer: Self-pay | Admitting: Internal Medicine

## 2022-03-07 ENCOUNTER — Ambulatory Visit: Payer: Medicare Other | Admitting: Internal Medicine

## 2022-03-27 ENCOUNTER — Encounter: Payer: Self-pay | Admitting: Internal Medicine

## 2022-03-27 NOTE — Patient Instructions (Addendum)
      Blood work was ordered.   The lab is on the first floor.    Medications changes include :       A referral was ordered for XXX.     Someone will call you to schedule an appointment.    Return in about 6 months (around 09/26/2022) for follow up.

## 2022-03-27 NOTE — Progress Notes (Unsigned)
      Subjective:    Patient ID: Carrie Dalton, female    DOB: Mar 12, 1956, 66 y.o.   MRN: [id number]     HPI Anoushka is here for follow up of her chronic medical problems, including htn, gerd, osteopenia, prediabetes  Review dexa - ? Start treatment  ? Labs - if so D, A1c, bmp  Medications and allergies reviewed with patient and updated if appropriate.  Current Outpatient Medications on File Prior to Visit  Medication Sig Dispense Refill   albuterol (VENTOLIN HFA) 108 (90 Base) MCG/ACT inhaler INHALE 2 PUFFS INTO THE LUNGS EVERY 6 HOURS AS NEEDED FOR WHEEZING OR SHORTNESS OF BREATH. 18 g 12   aspirin 81 MG tablet Take 81 mg by mouth daily.     budesonide-formoterol (SYMBICORT) 80-4.5 MCG/ACT inhaler Inhale 2 puffs into the lungs 2 (two) times daily as needed. (Patient not taking: Reported on 11/03/2021)     cholecalciferol (VITAMIN D) 1000 UNITS tablet Take 1,000 Units by mouth daily.     clonazePAM (KLONOPIN) 1 MG tablet 2 tabs for sleep as needed 60 tablet 5   Nebivolol HCl 20 MG TABS Take 1/2 (one-half) tablet by mouth once daily 45 tablet 0   pantoprazole (PROTONIX) 40 MG tablet Take 1 tablet by mouth once daily 90 tablet 0   phenazopyridine (PYRIDIUM) 200 MG tablet Take 1 tablet (200 mg total) by mouth 2 (two) times daily as needed. 60 tablet 11   triamcinolone (KENALOG) 0.1 % Apply 1 application topically as needed.     No current facility-administered medications on file prior to visit.     Review of Systems     Objective:  There were no vitals filed for this visit. BP Readings from Last 3 Encounters:  09/03/21 140/74  08/30/21 132/78  05/06/21 130/80   Wt Readings from Last 3 Encounters:  09/03/21 173 lb (78.5 kg)  08/30/21 170 lb 6 oz (77.3 kg)  05/06/21 187 lb (84.8 kg)   There is no height or weight on file to calculate BMI.    Physical Exam     Lab Results  Component Value Date   WBC 7.5 02/26/2021   HGB 14.2 02/26/2021   HCT 43.2 02/26/2021   PLT  267.0 02/26/2021   GLUCOSE 105 (H) 08/30/2021   CHOL 181 08/30/2021   TRIG 81.0 08/30/2021   HDL 49.50 08/30/2021   LDLDIRECT 161.1 03/13/2012   LDLCALC 115 (H) 08/30/2021   ALT 10 08/30/2021   AST 13 08/30/2021   NA 139 08/30/2021   K 4.6 08/30/2021   CL 104 08/30/2021   CREATININE 0.79 08/30/2021   BUN 10 08/30/2021   CO2 29 08/30/2021   TSH 0.92 02/26/2021   PSA WNL 03/24/2014   HGBA1C 5.5 08/30/2021     Assessment & Plan:    See Problem List for Assessment and Plan of chronic medical problems.

## 2022-03-28 ENCOUNTER — Ambulatory Visit (INDEPENDENT_AMBULATORY_CARE_PROVIDER_SITE_OTHER): Payer: Medicare Other | Admitting: Internal Medicine

## 2022-03-28 VITALS — BP 118/84 | HR 64 | Temp 98.0°F | Ht 62.0 in | Wt 180.0 lb

## 2022-03-28 DIAGNOSIS — M85851 Other specified disorders of bone density and structure, right thigh: Secondary | ICD-10-CM

## 2022-03-28 DIAGNOSIS — K219 Gastro-esophageal reflux disease without esophagitis: Secondary | ICD-10-CM

## 2022-03-28 DIAGNOSIS — K121 Other forms of stomatitis: Secondary | ICD-10-CM

## 2022-03-28 DIAGNOSIS — M79602 Pain in left arm: Secondary | ICD-10-CM

## 2022-03-28 DIAGNOSIS — R7303 Prediabetes: Secondary | ICD-10-CM

## 2022-03-28 DIAGNOSIS — I10 Essential (primary) hypertension: Secondary | ICD-10-CM | POA: Diagnosis not present

## 2022-03-28 MED ORDER — METHYLPREDNISOLONE 4 MG PO TBPK: ORAL_TABLET | ORAL | 0 refills | Status: DC

## 2022-03-28 MED ORDER — CLOTRIMAZOLE 1 % EX CREA: 1.0000 | TOPICAL_CREAM | Freq: Two times a day (BID) | CUTANEOUS | 0 refills | Status: DC

## 2022-03-28 NOTE — Assessment & Plan Note (Addendum)
Chronic Osteopenia with high FRAX Stressed regular exercise Continue vitamin D supplementation She is taking calcium but not daily is to consider taking a chewable that may be easier and well motivated for her to take because its large pill Gust that she is eligible for medication and that she is at an elevated risk fracture Will check DEXA every 2 years

## 2022-03-28 NOTE — Assessment & Plan Note (Signed)
Subacute Started after she had a COVID injection that seemed to have gone to deeper was in the wrong place Has occasional numbness/tingling down the arm Has pain with movement of the arm/shoulder Medrol Dosepak-see if that helps Referred to sports medicine-she will consider make an appointment for next week, but does not live close so it may be difficult

## 2022-03-28 NOTE — Assessment & Plan Note (Signed)
Acute Mild Trial of clotrimazole twice daily to corners of mouth

## 2022-03-28 NOTE — Assessment & Plan Note (Signed)
Chronic Blood pressure well controlled CMP Continue nebivolol 10 mg daily

## 2022-03-28 NOTE — Assessment & Plan Note (Signed)
Chronic Check a1c Low sugar / carb diet Stressed regular exercise  

## 2022-03-28 NOTE — Assessment & Plan Note (Addendum)
Chronic GERD controlled as long as she is taking the pantoprazole 40 mg daily-she did try to taper off of this but was not successful Discussed that she could try to get to at least a lower dose of 20 mg daily or add in Pepcid to see if she could take the pantoprazole every other day Discussed possible long-term consequences of taking the medication and taking a little bit less is better than taking the 40 mg daily-she will continue to work on this

## 2022-04-04 ENCOUNTER — Other Ambulatory Visit: Payer: Self-pay | Admitting: Internal Medicine

## 2022-04-05 NOTE — Telephone Encounter (Signed)
Clonazepam refilled 

## 2022-04-05 NOTE — Telephone Encounter (Signed)
Please advise on refill request.  Allergies  Allergen Reactions   Codeine    Compazine    Lunesta [Eszopiclone]    Merthiolate [Thimerosal (Thiomersal)] Dermatitis    Itching, blisters, rash to area applied    Nickel Dermatitis    Blistering of skin    Prochlorperazine Edisylate      Current Outpatient Medications:    albuterol (VENTOLIN HFA) 108 (90 Base) MCG/ACT inhaler, INHALE 2 PUFFS INTO THE LUNGS EVERY 6 HOURS AS NEEDED FOR WHEEZING OR SHORTNESS OF BREATH., Disp: 18 g, Rfl: 12   aspirin 81 MG tablet, Take 81 mg by mouth daily., Disp: , Rfl:    budesonide-formoterol (SYMBICORT) 80-4.5 MCG/ACT inhaler, Inhale 2 puffs into the lungs 2 (two) times daily as needed. (Patient not taking: Reported on 11/03/2021), Disp: , Rfl:    cholecalciferol (VITAMIN D) 1000 UNITS tablet, Take 1,000 Units by mouth daily., Disp: , Rfl:    clonazePAM (KLONOPIN) 1 MG tablet, 2 tabs for sleep as needed, Disp: 60 tablet, Rfl: 5   clotrimazole (LOTRIMIN) 1 % cream, Apply 1 Application topically 2 (two) times daily., Disp: 30 g, Rfl: 0   methylPREDNISolone (MEDROL DOSEPAK) 4 MG TBPK tablet, 24 mg PO on day 1, then decr. by 4 mg/day x5 days, Disp: 21 tablet, Rfl: 0   Nebivolol HCl 20 MG TABS, Take 1/2 (one-half) tablet by mouth once daily, Disp: 45 tablet, Rfl: 0   pantoprazole (PROTONIX) 40 MG tablet, Take 1 tablet by mouth once daily, Disp: 90 tablet, Rfl: 0   phenazopyridine (PYRIDIUM) 200 MG tablet, Take 1 tablet (200 mg total) by mouth 2 (two) times daily as needed., Disp: 60 tablet, Rfl: 11   triamcinolone (KENALOG) 0.1 %, Apply 1 application topically as needed., Disp: , Rfl:

## 2022-04-08 ENCOUNTER — Telehealth: Payer: Self-pay | Admitting: Internal Medicine

## 2022-04-08 MED ORDER — CLONAZEPAM 1 MG PO TABS: ORAL_TABLET | ORAL | 5 refills | Status: DC

## 2022-04-08 NOTE — Telephone Encounter (Signed)
Called and spoke with pt letting her know that CY had sent the corrected Rx to pharmacy for her and she verbalized understanding. Nothing further needed.

## 2022-04-08 NOTE — Telephone Encounter (Signed)
Corrected script sent to [facility].

## 2022-04-08 NOTE — Telephone Encounter (Signed)
Called and spoke with pt about the electronic refill we had received for her klonopin and stated to her that the electronic refill we had received was with the instructions to take 1 tablet by mouth two times daily with a quantity of 30 tablets.  Pt said that she takes two tablets at night, and none in the morning so the instructions should say take two tablets at bedtime. The quantity that was sent is also a 15-day supply, not for a full 30-days.  Dr. Maple Hudson, please advise on this if you are okay resending a new prescription to the pharmacy with instructions to take two tablets at bedtime and to have the quantity of 60tabs instead of 30tabs?

## 2022-04-21 ENCOUNTER — Other Ambulatory Visit: Payer: Self-pay | Admitting: Internal Medicine

## 2022-05-17 ENCOUNTER — Telehealth: Payer: Self-pay | Admitting: Internal Medicine

## 2022-05-17 MED ORDER — AZITHROMYCIN 250 MG PO TABS: ORAL_TABLET | ORAL | 0 refills | Status: DC

## 2022-05-17 MED ORDER — BENZONATATE 200 MG PO CAPS: 200.0000 mg | ORAL_CAPSULE | Freq: Three times a day (TID) | ORAL | 1 refills | Status: DC | PRN

## 2022-05-17 NOTE — Telephone Encounter (Signed)
Please let me know result of Covid test- that will affect what I suggest.

## 2022-05-17 NOTE — Telephone Encounter (Signed)
Informed pt of Dr Roxy Cedar recommendations and Z-pack and Benzonatate (tessalon) was sent to [facility] for her. Pt verbalized understanding. Nothing further needed.

## 2022-05-17 NOTE — Telephone Encounter (Signed)
I will send a Zpak and some Tessalon for cough. She can also take otc Delsym for cough.

## 2022-05-17 NOTE — Telephone Encounter (Signed)
Pt states she had a cold that stated 2 weeks ago has low grade fever taking delsym and mucinex. Also has congestion and sometimes coughs so much until she throws up mucous. I suggested she be tested for Covid-19 pt states she has a home test and will test herself. Also she does not have an appetite. I informed pt I will send message to provider. Pt verbalized understanding.

## 2022-05-24 ENCOUNTER — Ambulatory Visit: Payer: Medicare Other | Admitting: Internal Medicine

## 2022-05-30 ENCOUNTER — Encounter: Payer: Self-pay | Admitting: Primary Care

## 2022-05-30 ENCOUNTER — Ambulatory Visit (INDEPENDENT_AMBULATORY_CARE_PROVIDER_SITE_OTHER): Payer: Medicare Other | Admitting: Primary Care

## 2022-05-30 VITALS — BP 120/70 | HR 73 | Temp 97.7°F | Ht 62.0 in | Wt 176.2 lb

## 2022-05-30 DIAGNOSIS — J4530 Mild persistent asthma, uncomplicated: Secondary | ICD-10-CM | POA: Diagnosis not present

## 2022-05-30 DIAGNOSIS — F5101 Primary insomnia: Secondary | ICD-10-CM

## 2022-05-30 NOTE — Patient Instructions (Signed)
Recommendations Continue clonazepam 2 mg at bedtime for insomnia Use Symbicort inhaler 2 puffs every 12 hours as needed for shortness of breath or wheezing Keep Albuterol inhaler with you at all times for breakthrough symptoms   Follow-up 1 year with Dr. Annamaria Boots or sooner if needed  Insomnia Insomnia is a sleep disorder that makes it difficult to fall asleep or stay asleep. Insomnia can cause fatigue, low energy, difficulty concentrating, mood swings, and poor performance at work or school. There are three different ways to classify insomnia: Difficulty falling asleep. Difficulty staying asleep. Waking up too early in the morning. Any type of insomnia can be long-term (chronic) or short-term (acute). Both are common. Short-term insomnia usually lasts for 3 months or less. Chronic insomnia occurs at least three times a week for longer than 3 months. What are the causes? Insomnia may be caused by another condition, situation, or substance, such as: Having certain mental health conditions, such as anxiety and depression. Using caffeine, alcohol, tobacco, or drugs. Having gastrointestinal conditions, such as gastroesophageal reflux disease (GERD). Having certain medical conditions. These include: Asthma. Alzheimer's disease. Stroke. Chronic pain. An overactive thyroid gland (hyperthyroidism). Other sleep disorders, such as restless legs syndrome and sleep apnea. Menopause. Sometimes, the cause of insomnia may not be known. What increases the risk? Risk factors for insomnia include: Gender. Females are affected more often than males. Age. Insomnia is more common as people get older. Stress and certain medical and mental health conditions. Lack of exercise. Having an irregular work schedule. This may include working night shifts and traveling between different time zones. What are the signs or symptoms? If you have insomnia, the main symptom is having trouble falling asleep or having  trouble staying asleep. This may lead to other symptoms, such as: Feeling tired or having low energy. Feeling nervous about going to sleep. Not feeling rested in the morning. Having trouble concentrating. Feeling irritable, anxious, or depressed. How is this diagnosed? This condition may be diagnosed based on: Your symptoms and medical history. Your health care provider may ask about: Your sleep habits. Any medical conditions you have. Your mental health. A physical exam. How is this treated? Treatment for insomnia depends on the cause. Treatment may focus on treating an underlying condition that is causing the insomnia. Treatment may also include: Medicines to help you sleep. Counseling or therapy. Lifestyle adjustments to help you sleep better. Follow these instructions at home: Eating and drinking  Limit or avoid alcohol, caffeinated beverages, and products that contain nicotine and tobacco, especially close to bedtime. These can disrupt your sleep. Do not eat a large meal or eat spicy foods right before bedtime. This can lead to digestive discomfort that can make it hard for you to sleep. Sleep habits  Keep a sleep diary to help you and your health care provider figure out what could be causing your insomnia. Write down: When you sleep. When you wake up during the night. How well you sleep and how rested you feel the next day. Any side effects of medicines you are taking. What you eat and drink. Make your bedroom a dark, comfortable place where it is easy to fall asleep. Put up shades or blackout curtains to block light from outside. Use a white noise machine to block noise. Keep the temperature cool. Limit screen use before bedtime. This includes: Not watching TV. Not using your smartphone, tablet, or computer. Stick to a routine that includes going to bed and waking up at the same times  every day and night. This can help you fall asleep faster. Consider making a quiet  activity, such as reading, part of your nighttime routine. Try to avoid taking naps during the day so that you sleep better at night. Get out of bed if you are still awake after 15 minutes of trying to sleep. Keep the lights down, but try reading or doing a quiet activity. When you feel sleepy, go back to bed. General instructions Take over-the-counter and prescription medicines only as told by your health care provider. Exercise regularly as told by your health care provider. However, avoid exercising in the hours right before bedtime. Use relaxation techniques to manage stress. Ask your health care provider to suggest some techniques that may work well for you. These may include: Breathing exercises. Routines to release muscle tension. Visualizing peaceful scenes. Make sure that you drive carefully. Do not drive if you feel very sleepy. Keep all follow-up visits. This is important. Contact a health care provider if: You are tired throughout the day. You have trouble in your daily routine due to sleepiness. You continue to have sleep problems, or your sleep problems get worse. Get help right away if: You have thoughts about hurting yourself or someone else. Get help right away if you feel like you may hurt yourself or others, or have thoughts about taking your own life. Go to your nearest emergency room or: Call 911. Call the National Suicide Prevention Lifeline at 726-554-4283 or 988. This is open 24 hours a day. Text the Crisis Text Line at (763) 157-0092. Summary Insomnia is a sleep disorder that makes it difficult to fall asleep or stay asleep. Insomnia can be long-term (chronic) or short-term (acute). Treatment for insomnia depends on the cause. Treatment may focus on treating an underlying condition that is causing the insomnia. Keep a sleep diary to help you and your health care provider figure out what could be causing your insomnia. This information is not intended to replace advice  given to you by your health care provider. Make sure you discuss any questions you have with your health care provider. Document Revised: 04/19/2021 Document Reviewed: 04/19/2021 Elsevier Patient Education  2023 [employer].

## 2022-05-30 NOTE — Assessment & Plan Note (Signed)
-   Long standing hx insomnia. Tried and failed several different agents. Currently well controlled on Clonazepam 2mg  qhs for insomnia. No residual daytime grogginess.

## 2022-05-30 NOTE — Progress Notes (Signed)
@Patient  ID: Carrie Dalton, female    DOB: May 02, 1956, 67 y.o.   MRN: [id number]  Chief Complaint  Patient presents with   Follow-up    Recent URI-- lingering dry cough. Completed course of zpak.     Referring provider: Binnie Rail, MD  HPI: 67 year old female, former smoker.  Past medical history significant for asthma, hypertension, GERD, lipidemia, vitamin D deficiency, insomnia, anxiety, prediabetes.  Patient of Dr. Annamaria Boots  Previous [facility] pulmonary encounter: 02/22/21- 12 yoF former smoker followed for Chronic Insomnia, Asthma, complicated by PAT, +PPD, GERD, Anxiety/ Depression after loss of daughter,  -clonazepam and triazolam for sleep, Ventolin hfa, Symbicort 80 Covid vax-2 Phizer Flu vax- -----Pt states Klonopin is not working for her anymore. Haven't been able to sleep. No longer using triazolam. Hasn't needed inhalers in a long time because she is not exposed to smoke/ strong perfumes.     05/30/2022- Interim hx  Patient presents today for follow-up asthma. She has mild intermittent asthma, using inhalers as needed. Clonazepam 1.5mg  no longer effective for chronic insomnia. During last visit in October she was given trial Trazodone 50-150mg  for insomnia without improvement. Lunesta caused nausea and high blood pressure. Given samples Dayvigo 5mg .   Currently taking clonazepam 2mg  at bedtime. That is working well for her. Typical bedtime is 11pm. It takes her 30 mins to fall asleep when taking medication. She starts her day between 7-8am. No residual daytime grogginess.   He asthma symptoms are well controlled, She avoids triggers. No longer using Symbicort regularly but keeps in on hand in case. She had an upper respiratory infection 2- 3 weeks ago, treated with azithromycin. She has a lingering dry cough. She took mucinex and delsym. Benzonate did not help.   Covid vax-2 Phizer (2021) Flu vax- Not up to date, declined   Allergies  Allergen Reactions   Codeine     Compazine    Lunesta [Eszopiclone]    Merthiolate [Thimerosal (Thiomersal)] Dermatitis    Itching, blisters, rash to area applied    Nickel Dermatitis    Blistering of skin    Prochlorperazine Edisylate     Immunization History  Administered Date(s) Administered   Influenza Split 02/21/2012   Influenza,inj,Quad PF,6+ Mos 02/20/2013   Influenza-Unspecified 02/17/2015, 02/16/2016, 02/22/2017   PFIZER(Purple Top)SARS-COV-2 Vaccination 06/04/2019, 06/25/2019   PNEUMOCOCCAL CONJUGATE-20 07/27/2020   Pneumococcal Polysaccharide-23 11/05/2013   Td 08/28/2008   Tdap 01/11/2016   Zoster Recombinat (Shingrix) 09/14/2017, 07/26/2019   Zoster, Live 02/17/2015    Past Medical History:  Diagnosis Date   ASTHMA, UNSPECIFIED, UNSPECIFIED STATUS    Closed fracture of right distal fibula 05/09/2017   DEGENERATIVE JOINT DISEASE    Family history of adverse reaction to anesthesia    ponv   Hyperlipidemia    Hypertension    NECK PAIN 11/05/2009   OSTEOPENIA    POSITIVE PPD    SLEEP DISORDER, HX OF    UNSPECIFIED PAROXYSMAL TACHYCARDIA     Tobacco History: Social History   Tobacco Use  Smoking Status Former   Packs/day: 0.50   Types: Cigarettes   Quit date: 1992   Years since quitting: 32.0  Smokeless Tobacco Never   Counseling given: Not Answered   Outpatient Medications Prior to Visit  Medication Sig Dispense Refill   aspirin 81 MG tablet Take 81 mg by mouth daily.     cholecalciferol (VITAMIN D) 1000 UNITS tablet Take 1,000 Units by mouth daily.     clonazePAM (KLONOPIN) 1 MG tablet 2  at bedtime for sleep 60 tablet 5   clotrimazole (LOTRIMIN) 1 % cream Apply 1 Application topically 2 (two) times daily. 30 g 0   Nebivolol HCl 20 MG TABS Take 1/2 (one-half) tablet by mouth once daily 45 tablet 0   pantoprazole (PROTONIX) 40 MG tablet Take 1 tablet by mouth once daily 90 tablet 0   phenazopyridine (PYRIDIUM) 200 MG tablet Take 1 tablet (200 mg total) by mouth 2 (two) times  daily as needed. 60 tablet 11   triamcinolone (KENALOG) 0.1 % Apply 1 application topically as needed.     azithromycin (ZITHROMAX) 250 MG tablet 2 today then one daily 6 tablet 0   benzonatate (TESSALON) 200 MG capsule Take 1 capsule (200 mg total) by mouth 3 (three) times daily as needed for cough. 30 capsule 1   methylPREDNISolone (MEDROL DOSEPAK) 4 MG TBPK tablet 24 mg PO on day 1, then decr. by 4 mg/day x5 days 21 tablet 0   albuterol (VENTOLIN HFA) 108 (90 Base) MCG/ACT inhaler INHALE 2 PUFFS INTO THE LUNGS EVERY 6 HOURS AS NEEDED FOR WHEEZING OR SHORTNESS OF BREATH. (Patient not taking: Reported on 05/30/2022) 18 g 12   budesonide-formoterol (SYMBICORT) 80-4.5 MCG/ACT inhaler Inhale 2 puffs into the lungs 2 (two) times daily as needed. (Patient not taking: Reported on 05/30/2022)     No facility-administered medications prior to visit.   Review of Systems  Review of Systems  Constitutional: Negative.   HENT: Negative.    Respiratory: Negative.    Cardiovascular: Negative.     Physical Exam  BP 120/70 (BP Location: Left Arm, Cuff Size: Normal)   Pulse 73   Temp 97.7 F (36.5 C) (Temporal)   Ht 5\' 2"  (1.575 m)   Wt 176 lb 3.2 oz (79.9 kg)   SpO2 99%   BMI 32.23 kg/m  Physical Exam Constitutional:      Appearance: Normal appearance.  HENT:     Head: Normocephalic.     Mouth/Throat:     Mouth: Mucous membranes are moist.     Pharynx: Oropharynx is clear.  Cardiovascular:     Rate and Rhythm: Normal rate and regular rhythm.  Pulmonary:     Effort: Pulmonary effort is normal.     Breath sounds: Normal breath sounds.  Musculoskeletal:        General: Normal range of motion.  Skin:    General: Skin is warm and dry.  Neurological:     General: No focal deficit present.     Mental Status: She is alert and oriented to person, place, and time. Mental status is at baseline.  Psychiatric:        Mood and Affect: Mood normal.        Behavior: Behavior normal.        Thought  Content: Thought content normal.        Judgment: Judgment normal.      Lab Results:  CBC    Component Value Date/Time   WBC 7.5 02/26/2021 1144   RBC 4.88 02/26/2021 1144   HGB 14.2 02/26/2021 1144   HCT 43.2 02/26/2021 1144   PLT 267.0 02/26/2021 1144   MCV 88.6 02/26/2021 1144   MCHC 33.0 02/26/2021 1144   RDW 12.9 02/26/2021 1144   LYMPHSABS 1.4 02/26/2021 1144   MONOABS 0.6 02/26/2021 1144   EOSABS 1.0 (H) 02/26/2021 1144   BASOSABS 0.1 02/26/2021 1144    BMET    Component Value Date/Time   NA 139 08/30/2021 1207  NA 145 (H) 03/31/2021 1029   K 4.6 08/30/2021 1207   CL 104 08/30/2021 1207   CO2 29 08/30/2021 1207   GLUCOSE 105 (H) 08/30/2021 1207   BUN 10 08/30/2021 1207   BUN 8 03/31/2021 1029   CREATININE 0.79 08/30/2021 1207   CREATININE 0.73 02/18/2020 1355   CALCIUM 9.8 08/30/2021 1207   GFRNONAA 86 02/18/2020 1355   GFRAA 100 02/18/2020 1355    BNP No results found for: "BNP"  ProBNP No results found for: "PROBNP"  Imaging: No results found.   Assessment & Plan:   Asthma, mild persistent - Well controlled on prn ICS/LABA. Rare SABA. No recent hospitalizations or steriod use - Continue Symbicort two puffs q12 hours as needed for sob/wheezing    Insomnia - Long standing hx insomnia. Tried and failed several different agents. Currently well controlled on Clonazepam 2mg  qhs for insomnia. No residual daytime grogginess.   , NP 05/30/2022

## 2022-05-30 NOTE — Assessment & Plan Note (Signed)
-   Well controlled on prn ICS/LABA. Rare SABA. No recent hospitalizations or steriod use - Continue Symbicort 41mcg two puffs q12 hours as needed for sob/wheezing

## 2022-07-18 ENCOUNTER — Telehealth: Payer: Self-pay

## 2022-07-18 MED ORDER — AZITHROMYCIN 250 MG PO TABS: ORAL_TABLET | ORAL | 0 refills | Status: AC

## 2022-07-18 MED ORDER — TRELEGY ELLIPTA 100-62.5-25 MCG/ACT IN AEPB: 1.0000 | INHALATION_SPRAY | Freq: Every day | RESPIRATORY_TRACT | 0 refills | Status: DC

## 2022-07-18 NOTE — Telephone Encounter (Signed)
Patient stopped by office requesting acute visit. [place] office did not have availability. She stated that she developed sore throat, dry cough and chest/nasal congestion 3 weeks ago. Negative for covid at that time.  Sore thoart has subsided, however prod cough with yellow to green sputum, wheezing and chest/nasal congestion has lingered.  Denied f/c/s or additional sx.   She is using Mucinex once daily and delsym BID Not using Ventolin or Symbicort, as she does not have a Rx due to insurance reasons.  Dr. Annamaria Boots, please advise. Thanks  Current Outpatient Medications on File Prior to Visit  Medication Sig Dispense Refill   albuterol (VENTOLIN HFA) 108 (90 Base) MCG/ACT inhaler INHALE 2 PUFFS INTO THE LUNGS EVERY 6 HOURS AS NEEDED FOR WHEEZING OR SHORTNESS OF BREATH. (Patient not taking: Reported on 05/30/2022) 18 g 12   aspirin 81 MG tablet Take 81 mg by mouth daily.     budesonide-formoterol (SYMBICORT) 80-4.5 MCG/ACT inhaler Inhale 2 puffs into the lungs 2 (two) times daily as needed. (Patient not taking: Reported on 05/30/2022)     cholecalciferol (VITAMIN D) 1000 UNITS tablet Take 1,000 Units by mouth daily.     clonazePAM (KLONOPIN) 1 MG tablet 2 at bedtime for sleep 60 tablet 5   clotrimazole (LOTRIMIN) 1 % cream Apply 1 Application topically 2 (two) times daily. 30 g 0   Nebivolol HCl 20 MG TABS Take 1/2 (one-half) tablet by mouth once daily 45 tablet 0   pantoprazole (PROTONIX) 40 MG tablet Take 1 tablet by mouth once daily 90 tablet 0   phenazopyridine (PYRIDIUM) 200 MG tablet Take 1 tablet (200 mg total) by mouth 2 (two) times daily as needed. 60 tablet 11   triamcinolone (KENALOG) 0.1 % Apply 1 application topically as needed.     No current facility-administered medications on file prior to visit.   Allergies  Allergen Reactions   Codeine    Compazine    Lunesta [Eszopiclone]    Merthiolate [Thimerosal (Thiomersal)] Dermatitis    Itching, blisters, rash to area applied     Nickel Dermatitis    Blistering of skin    Prochlorperazine Edisylate

## 2022-07-18 NOTE — Telephone Encounter (Signed)
Patient is aware of recommendations and voiced her understanding.  Zpak sent to preferred pharmacy. 2 trelegy samples given.  Nothing further needed.

## 2022-07-18 NOTE — Telephone Encounter (Signed)
Please send Zpak 250 mg, # 6, 2 today then one daily  Can [place] office give her an inhaler sample, maybe Trelegy 100, inhale 1 puff then rinse mouth, once daily

## 2022-07-28 ENCOUNTER — Other Ambulatory Visit: Payer: Self-pay | Admitting: Internal Medicine

## 2022-07-28 ENCOUNTER — Other Ambulatory Visit: Payer: Self-pay

## 2022-08-08 ENCOUNTER — Encounter: Payer: Self-pay | Admitting: Family Medicine

## 2022-08-08 ENCOUNTER — Telehealth (INDEPENDENT_AMBULATORY_CARE_PROVIDER_SITE_OTHER): Payer: Medicare Other | Admitting: Family Medicine

## 2022-08-08 ENCOUNTER — Telehealth: Payer: Self-pay | Admitting: Internal Medicine

## 2022-08-08 VITALS — Ht 62.0 in

## 2022-08-08 DIAGNOSIS — B029 Zoster without complications: Secondary | ICD-10-CM | POA: Diagnosis not present

## 2022-08-08 DIAGNOSIS — M792 Neuralgia and neuritis, unspecified: Secondary | ICD-10-CM | POA: Diagnosis not present

## 2022-08-08 MED ORDER — TRAMADOL HCL 50 MG PO TABS: 50.0000 mg | ORAL_TABLET | Freq: Two times a day (BID) | ORAL | 0 refills | Status: AC | PRN

## 2022-08-08 MED ORDER — VALACYCLOVIR HCL 1 G PO TABS: 1000.0000 mg | ORAL_TABLET | Freq: Three times a day (TID) | ORAL | 0 refills | Status: AC

## 2022-08-08 MED ORDER — LIDOCAINE 3 % EX CREA: 1.0000 | TOPICAL_CREAM | Freq: Three times a day (TID) | CUTANEOUS | 1 refills | Status: AC | PRN

## 2022-08-08 NOTE — Telephone Encounter (Signed)
Pt stated she has shingles wondering if someone can prescribe something instead of coming in. Please call pt back with update

## 2022-08-08 NOTE — Progress Notes (Signed)
Virtual Visit via Video Note I connected with Carrie Dalton on 08/08/22 by a video enabled telemedicine application and verified that I am speaking with the correct person using two identifiers. Location patient: home Location provider:work office Persons participating in the virtual visit: patient, provider  I discussed the limitations of evaluation and management by telemedicine and the availability of in person appointments. The patient expressed understanding and agreed to proceed.  Chief Complaint  Patient presents with   Herpes Zoster   HPI: Carrie Dalton is a 67 year old female with history of PSVT, hypertension, GERD, asthma, and anxiety complaining painful rash on her right side, suspected to be shingles based on observations from her niece, a Designer, jewellery, and a neighboring nurse.  Her symptoms began 2 days ago with with pain in her rib cage and muscle soreness on affected area, today her husband noted erythematous rash. Having severe constant pain, needle/pin sensation. She has not applied any treatments to the affected area and has not tried OTC medication. She has received two shingles vaccines in the past. Negative for fever, chills, cough, dyspnea, CP, abdominal pain, nausea, vomiting, changes in bowel habits, or decreased urine output.  ROS: See pertinent positives and negatives per HPI.  Past Medical History:  Diagnosis Date   ASTHMA, UNSPECIFIED, UNSPECIFIED STATUS    Closed fracture of right distal fibula 05/09/2017   DEGENERATIVE JOINT DISEASE    Family history of adverse reaction to anesthesia    ponv   Hyperlipidemia    Hypertension    NECK PAIN 11/05/2009   OSTEOPENIA    POSITIVE PPD    SLEEP DISORDER, HX OF    UNSPECIFIED PAROXYSMAL TACHYCARDIA     Past Surgical History:  Procedure Laterality Date   CESAREAN SECTION  1984   DILATION AND CURETTAGE OF UTERUS     x 3   ORIF FIBULA FRACTURE Right 05/09/2017   Procedure: OPEN REDUCTION INTERNAL  FIXATION (ORIF) RIGHT DISTAL FIBULA FRACTURE;  Surgeon: Marchia Bond, MD;  Location: [place];  Service: Orthopedics;  Laterality: Right;    Family History  Problem Relation Age of Onset   Heart failure Mother    Stroke Mother    Atrial fibrillation Mother    Heart attack Father    Heart failure Father    Diabetes Father    Hypertension Father    Pancreatitis Daughter    Heart disease Daughter 107       Restrictive cardiomyopathy status post heart transplant   Atrial fibrillation Maternal Grandmother    Heart failure Maternal Grandmother        CHF   Hypertension Maternal Grandmother    Prostate cancer Maternal Grandfather    Heart attack Brother 57       half-brother   Breast cancer Neg Hx     Social History   Socioeconomic History   Marital status: Married    Spouse name: Not on file   Number of children: 3   Years of education: Not on file   Highest education level: Not on file  Occupational History   Occupation: [employer]    Employer: [employer]  Tobacco Use   Smoking status: Former    Packs/day: .5    Types: Cigarettes    Quit date: 1992    Years since quitting: 32.2   Smokeless tobacco: Never  Vaping Use   Vaping Use: Never used  Substance and Sexual Activity   Alcohol use: No   Drug use: No  Sexual activity: Not Currently  Other Topics Concern   Not on file  Social History Narrative   Not on file   Social Determinants of Health   Financial Resource Strain: Low Risk  (11/03/2021)   Overall Financial Resource Strain (CARDIA)    Difficulty of Paying Living Expenses: Not hard at all  Food Insecurity: No Food Insecurity (11/03/2021)   Hunger Vital Sign    Worried About Running Out of Food in the Last Year: Never true    Ran Out of Food in the Last Year: Never true  Transportation Needs: No Transportation Needs (11/03/2021)   PRAPARE - Hydrologist (Medical): No    Lack of Transportation  (Non-Medical): No  Physical Activity: Sufficiently Active (11/03/2021)   Exercise Vital Sign    Days of Exercise per Week: 4 days    Minutes of Exercise per Session: 40 min  Stress: No Stress Concern Present (11/03/2021)   [place]    Feeling of Stress : Not at all  Social Connections: [place] (11/03/2021)   Social Connection and Isolation Panel [NHANES]    Frequency of Communication with Friends and Family: More than three times a week    Frequency of Social Gatherings with Friends and Family: More than three times a week    Attends Religious Services: More than 4 times per year    Active Member of [employer] or Organizations: Yes    Attends Archivist Meetings: More than 4 times per year    Marital Status: Married  Human resources officer Violence: Not At Risk (11/03/2021)   Humiliation, Afraid, Rape, and Kick questionnaire    Fear of Current or Ex-Partner: No    Emotionally Abused: No    Physically Abused: No    Sexually Abused: No   Current Outpatient Medications:    albuterol (VENTOLIN HFA) 108 (90 Base) MCG/ACT inhaler, INHALE 2 PUFFS INTO THE LUNGS EVERY 6 HOURS AS NEEDED FOR WHEEZING OR SHORTNESS OF BREATH., Disp: 18 g, Rfl: 12   aspirin 81 MG tablet, Take 81 mg by mouth daily., Disp: , Rfl:    budesonide-formoterol (SYMBICORT) 80-4.5 MCG/ACT inhaler, Inhale 2 puffs into the lungs 2 (two) times daily as needed., Disp: , Rfl:    cholecalciferol (VITAMIN D) 1000 UNITS tablet, Take 1,000 Units by mouth daily., Disp: , Rfl:    clonazePAM (KLONOPIN) 1 MG tablet, 2 at bedtime for sleep, Disp: 60 tablet, Rfl: 5   clotrimazole (LOTRIMIN) 1 % cream, Apply 1 Application topically 2 (two) times daily., Disp: 30 g, Rfl: 0   Fluticasone-Umeclidin-Vilant (TRELEGY ELLIPTA) 100-62.5-25 MCG/ACT AEPB, Inhale 1 puff into the lungs daily., Disp: 14 each, Rfl: 0   Nebivolol HCl 20 MG TABS, Take 1/2 (one-half) tablet by mouth  once daily, Disp: 45 tablet, Rfl: 0   pantoprazole (PROTONIX) 40 MG tablet, Take 1 tablet by mouth once daily, Disp: 90 tablet, Rfl: 0   phenazopyridine (PYRIDIUM) 200 MG tablet, Take 1 tablet (200 mg total) by mouth 2 (two) times daily as needed., Disp: 60 tablet, Rfl: 11   triamcinolone (KENALOG) 0.1 %, Apply 1 application topically as needed., Disp: , Rfl:   EXAM:  VITALS per patient if applicable:Ht 5\' 2"  (1.575 m)   BMI 32.23 kg/m   GENERAL: alert, oriented, appears well and in no acute distress  HEENT: atraumatic, conjunctiva clear, no obvious abnormalities on inspection.  NECK: normal movements of the head and neck  LUNGS:  on inspection no signs of respiratory distress, breathing rate appears normal, no obvious gross SOB, gasping or wheezing  CV: no obvious cyanosis  MS: moves all visible extremities without noticeable abnormality  SKIN: Erythematous papular rash with dermatomal distribution from posterior right rib cage towards abdomen.  PSYCH/NEURO: pleasant and cooperative, no obvious depression or anxiety, speech and thought processing grossly intact  ASSESSMENT AND PLAN:  Discussed the following assessment and plan:  Herpes zoster without complication Educated about diagnosis, prognosis, treatment. She will start Valtrex at 1000 mg as soon as possible 3 times daily for 7 days. For pain management recommend tramadol and topical lidocaine.  -     valACYclovir HCl; Take 1 tablet (1,000 mg total) by mouth 3 (three) times daily for 7 days.  Dispense: 21 tablet; Refill: 0 -     Lidocaine; Apply 1 Application topically 3 (three) times daily as needed.  Dispense: 85 g; Refill: 1  Neuropathic pain Explained that pain can last a few weeks. For now recommend tramadol 50 mg twice daily as needed and topical lidocaine 3 times daily as needed. Gabapentin can be considered for up to 3 months if pain is persisting. Follow-up with PCP as needed.  -     traMADol HCl; Take 1  tablet (50 mg total) by mouth every 12 (twelve) hours as needed for up to 7 days.  Dispense: 14 tablet; Refill: 0 -     Lidocaine; Apply 1 Application topically 3 (three) times daily as needed.  Dispense: 85 g; Refill: 1  We discussed possible serious and likely etiologies, options for evaluation and workup, limitations of telemedicine visit vs in person visit, treatment, treatment risks and precautions. The patient was advised to call back or seek an in-person evaluation if the symptoms worsen or if the condition fails to improve as anticipated. I discussed the assessment and treatment plan with the patient. The patient was provided an opportunity to ask questions and all were answered. The patient agreed with the plan and demonstrated an understanding of the instructions.  Return if symptoms worsen or fail to improve. Jose Alleyne G. Martinique, MD  [facility] [place]. [place] office.

## 2022-08-08 NOTE — Telephone Encounter (Signed)
Patient has appointment today with Dr. Martinique at 4:30 pm

## 2022-08-23 NOTE — Telephone Encounter (Signed)
Extending valtrex will not help.  Can try gabapentin for nerve pain but here are possible side effects  - she should ideally come in to discuss

## 2022-08-23 NOTE — Telephone Encounter (Signed)
Spoke with patient today. First available appointment with Dr. Quay Burow was on Monday. Set her up to see Vickie tomorrow. She will call and cancel if she decides to wait appointment out.

## 2022-08-23 NOTE — Telephone Encounter (Signed)
Patient has questions about shingles symptoms and duration. She would like a call back at [phone].

## 2022-08-24 ENCOUNTER — Ambulatory Visit: Payer: Medicare Other | Admitting: Family Medicine

## 2022-09-24 IMAGING — CT CT HEART MORP W/ CTA COR W/ SCORE W/ CA W/CM &/OR W/O CM
1 of 13 series · 4 of 20 positions shown, 5 images · non-contrast
Comparison: None

Addendum:
CLINICAL DATA: Chest pain, dyspnea on exertion

EXAM:
Cardiac/Coronary  CTA
TECHNIQUE: The patient was scanned on a Siemens Somatom go.Top scanner.

[Series 27: multiphase % cta coronary 0.60 · axial · 0.36mm/px · z∈[-1094,-1018]mm · 4 of 3840 slices shown, 5 images]
[im 768/3840  vessel]
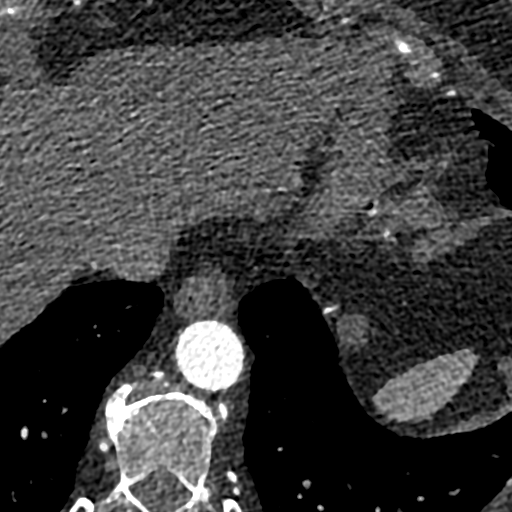
[im 768/3840  lung]
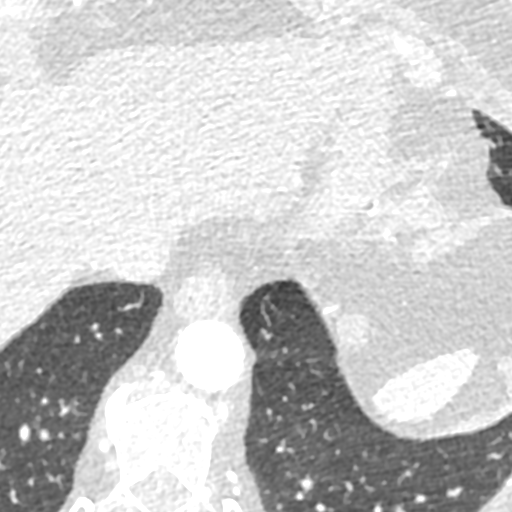
[im 1536/3840  vessel]
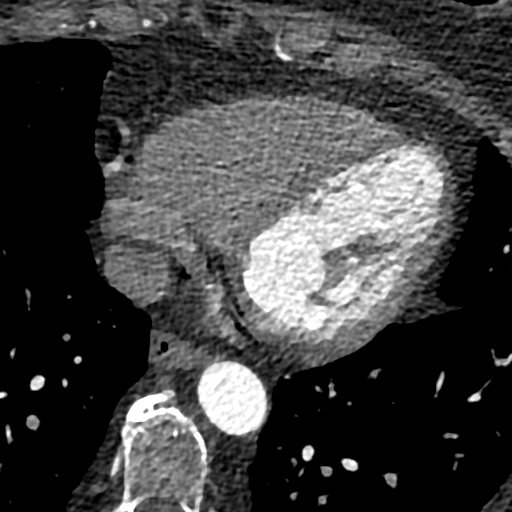
[im 2304/3840  vessel]
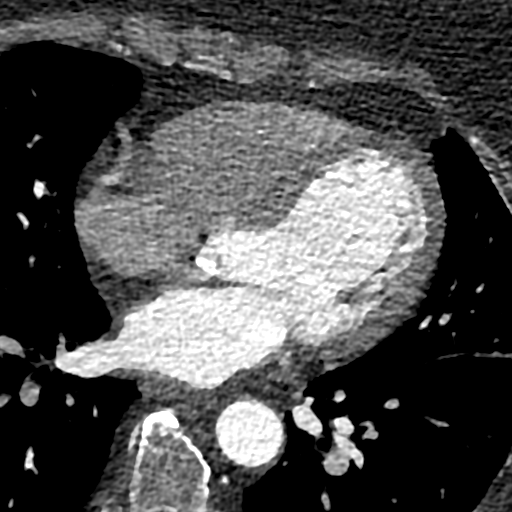
[im 3072/3840  vessel]
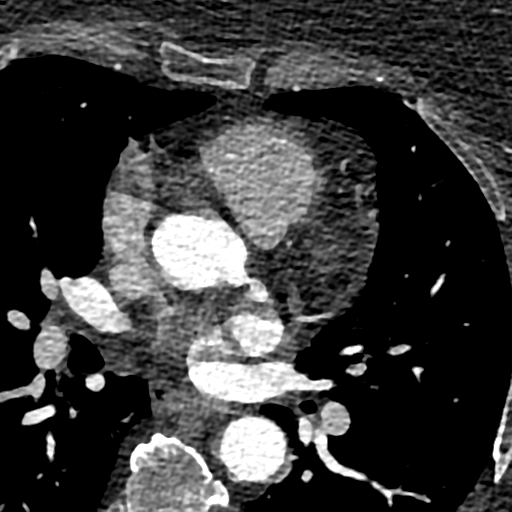

[4 of 20 positions shown; findings below may reference images not displayed]



Aortic Valve:  Trileaflet.  No calcifications.

Coronary Arteries:  Normal coronary origin.  Right dominance.

RCA is a dominant artery that gives rise to PDA and PLA. There is no
plaque.

Left main is a large artery that gives rise to LAD and LCX arteries.
LM has no disease.

LAD has no plaque.

LCX is a non-dominant artery that gives rise to two obtuse marginal
branches. There is no plaque.

Other findings:

Normal pulmonary vein drainage into the left atrium.

Normal left atrial appendage without a thrombus.

Normal size of the pulmonary artery.
IMPRESSION: 1. Normal coronary calcium score of 0. Patient is low risk for
coronary events.

2. Normal coronary origin with right dominance.

3. No evidence of CAD.

4. CAD-RADS 0. Consider non-atherosclerotic causes of chest pain.

EXAM:
OVER-READ INTERPRETATION  CT CHEST

The following report is an over-read performed by radiologist Dr.
over-read does not include interpretation of cardiac or coronary
anatomy or pathology. The coronary calcium and coronary angiogram
interpretation by the cardiologist is attached. Imaging of the chest
is limited to the mid chest and cardiac structures.
FINDINGS: See dedicated report for details regarding cardiac findings and
vascular findings.

Mediastinum/Nodes: No adenopathy in the chest to the extent
evaluated.

Lungs/Pleura: Lungs are clear to the extent evaluated. Airways are
patent. No effusion

Upper Abdomen: Incidental imaging of upper abdominal contents
without acute process on limited assessment.

Musculoskeletal: Spinal degenerative changes without acute or
destructive bone process.
IMPRESSION: No acute or significant extracardiac findings.



Aortic Valve:  Trileaflet.  No calcifications.

Coronary Arteries:  Normal coronary origin.  Right dominance.

RCA is a dominant artery that gives rise to PDA and PLA. There is no
plaque.

Left main is a large artery that gives rise to LAD and LCX arteries.
LM has no disease.

LAD has no plaque.

LCX is a non-dominant artery that gives rise to two obtuse marginal
branches. There is no plaque.

Other findings:

Normal pulmonary vein drainage into the left atrium.

Normal left atrial appendage without a thrombus.

Normal size of the pulmonary artery.
IMPRESSION: 1. Normal coronary calcium score of 0. Patient is low risk for
coronary events.

2. Normal coronary origin with right dominance.

3. No evidence of CAD.

4. CAD-RADS 0. Consider non-atherosclerotic causes of chest pain.

## 2022-09-26 ENCOUNTER — Ambulatory Visit: Payer: Medicare Other | Admitting: Internal Medicine

## 2022-09-26 ENCOUNTER — Encounter: Payer: Self-pay | Admitting: Internal Medicine

## 2022-09-26 NOTE — Progress Notes (Unsigned)
Subjective:    Patient ID: Carrie Dalton, female    DOB: Jul 22, 1955, 67 y.o.   MRN: [id number]     HPI Carrie Dalton is here for follow up of her chronic medical problems.  Still getting some nerve itching /bugs crawling where she had shingles.    Still gets flushing - not cardiac per cardiologist.  Saw gyn - no gyn related.  Gets flushing daily -- lasts about 10 min.  Occurs during day and night.  Typically just head and neck get red and then she sweats.     Walking some  Medications and allergies reviewed with patient and updated if appropriate.  Current Outpatient Medications on File Prior to Visit  Medication Sig Dispense Refill   albuterol (VENTOLIN HFA) 108 (90 Base) MCG/ACT inhaler INHALE 2 PUFFS INTO THE LUNGS EVERY 6 HOURS AS NEEDED FOR WHEEZING OR SHORTNESS OF BREATH. 18 g 12   aspirin 81 MG tablet Take 81 mg by mouth daily.     budesonide-formoterol (SYMBICORT) 80-4.5 MCG/ACT inhaler Inhale 2 puffs into the lungs 2 (two) times daily as needed.     cholecalciferol (VITAMIN D) 1000 UNITS tablet Take 1,000 Units by mouth daily.     clonazePAM (KLONOPIN) 1 MG tablet 2 at bedtime for sleep 60 tablet 5   clotrimazole (LOTRIMIN) 1 % cream Apply 1 Application topically 2 (two) times daily. 30 g 0   Fluticasone-Umeclidin-Vilant (TRELEGY ELLIPTA) 100-62.5-25 MCG/ACT AEPB Inhale 1 puff into the lungs daily. 14 each 0   Nebivolol HCl 20 MG TABS Take 1/2 (one-half) tablet by mouth once daily 45 tablet 0   pantoprazole (PROTONIX) 40 MG tablet Take 1 tablet by mouth once daily 90 tablet 0   phenazopyridine (PYRIDIUM) 200 MG tablet Take 1 tablet (200 mg total) by mouth 2 (two) times daily as needed. 60 tablet 11   triamcinolone (KENALOG) 0.1 % Apply 1 application topically as needed.     No current facility-administered medications on file prior to visit.     Review of Systems  Constitutional:  Positive for diaphoresis (flushing). Negative for fever.  Respiratory:  Positive for  wheezing (wtih allergies). Negative for cough and shortness of breath.   Cardiovascular:  Negative for chest pain, palpitations and leg swelling.  Neurological:  Positive for headaches (occ). Negative for light-headedness.       Objective:   Vitals:   09/27/22 1022  BP: 126/78  Pulse: 70  Temp: 98.2 F (36.8 C)  SpO2: 96%   BP Readings from Last 3 Encounters:  09/27/22 126/78  05/30/22 120/70  03/28/22 118/84   Wt Readings from Last 3 Encounters:  09/27/22 179 lb (81.2 kg)  05/30/22 176 lb 3.2 oz (79.9 kg)  03/28/22 180 lb (81.6 kg)   Body mass index is 32.74 kg/m.    Physical Exam Constitutional:      General: She is not in acute distress.    Appearance: Normal appearance.  HENT:     Head: Normocephalic and atraumatic.  Eyes:     Conjunctiva/sclera: Conjunctivae normal.  Cardiovascular:     Rate and Rhythm: Normal rate and regular rhythm.     Heart sounds: Normal heart sounds.  Pulmonary:     Effort: Pulmonary effort is normal. No respiratory distress.     Breath sounds: Normal breath sounds. No wheezing.  Musculoskeletal:     Cervical back: Neck supple.     Right lower leg: No edema.     Left lower leg: No  edema.  Lymphadenopathy:     Cervical: No cervical adenopathy.  Skin:    General: Skin is warm and dry.     Findings: No rash.  Neurological:     Mental Status: She is alert. Mental status is at baseline.  Psychiatric:        Mood and Affect: Mood normal.        Behavior: Behavior normal.        Lab Results  Component Value Date   WBC 7.5 02/26/2021   HGB 14.2 02/26/2021   HCT 43.2 02/26/2021   PLT 267.0 02/26/2021   GLUCOSE 105 (H) 08/30/2021   CHOL 181 08/30/2021   TRIG 81.0 08/30/2021   HDL 49.50 08/30/2021   LDLDIRECT 161.1 03/13/2012   LDLCALC 115 (H) 08/30/2021   ALT 10 08/30/2021   AST 13 08/30/2021   NA 139 08/30/2021   K 4.6 08/30/2021   CL 104 08/30/2021   CREATININE 0.79 08/30/2021   BUN 10 08/30/2021   CO2 29 08/30/2021    TSH 0.92 02/26/2021   PSA WNL 03/24/2014   HGBA1C 5.5 08/30/2021     Assessment & Plan:    See Problem List for Assessment and Plan of chronic medical problems.

## 2022-09-26 NOTE — Patient Instructions (Addendum)
      Blood work was ordered.   The lab is on the first floor.    Medications changes include :   fosamax 70 mg once a week    A referral was ordered for Endocrine and [facility].     Someone will call you to schedule an appointment.    Return in about 6 months (around 03/30/2023) for follow up.

## 2022-09-27 ENCOUNTER — Ambulatory Visit (INDEPENDENT_AMBULATORY_CARE_PROVIDER_SITE_OTHER): Payer: Medicare Other | Admitting: Internal Medicine

## 2022-09-27 VITALS — BP 126/78 | HR 70 | Temp 98.2°F | Ht 62.0 in | Wt 179.0 lb

## 2022-09-27 DIAGNOSIS — I10 Essential (primary) hypertension: Secondary | ICD-10-CM | POA: Diagnosis not present

## 2022-09-27 DIAGNOSIS — K219 Gastro-esophageal reflux disease without esophagitis: Secondary | ICD-10-CM

## 2022-09-27 DIAGNOSIS — M85851 Other specified disorders of bone density and structure, right thigh: Secondary | ICD-10-CM | POA: Diagnosis not present

## 2022-09-27 DIAGNOSIS — E7849 Other hyperlipidemia: Secondary | ICD-10-CM

## 2022-09-27 DIAGNOSIS — M79602 Pain in left arm: Secondary | ICD-10-CM

## 2022-09-27 DIAGNOSIS — Z6834 Body mass index (BMI) 34.0-34.9, adult: Secondary | ICD-10-CM

## 2022-09-27 DIAGNOSIS — E559 Vitamin D deficiency, unspecified: Secondary | ICD-10-CM

## 2022-09-27 DIAGNOSIS — R232 Flushing: Secondary | ICD-10-CM

## 2022-09-27 DIAGNOSIS — E66811 Obesity, class 1: Secondary | ICD-10-CM

## 2022-09-27 DIAGNOSIS — R7303 Prediabetes: Secondary | ICD-10-CM | POA: Diagnosis not present

## 2022-09-27 DIAGNOSIS — E6609 Other obesity due to excess calories: Secondary | ICD-10-CM

## 2022-09-27 LAB — CBC WITH DIFFERENTIAL/PLATELET
Basophils Absolute: 0.1 10*3/uL (ref 0.0–0.1)
Basophils Relative: 1 % (ref 0.0–3.0)
Eosinophils Absolute: 0.6 10*3/uL (ref 0.0–0.7)
Eosinophils Relative: 8 % — ABNORMAL HIGH (ref 0.0–5.0)
HCT: 42.5 % (ref 36.0–46.0)
Hemoglobin: 14.5 g/dL (ref 12.0–15.0)
Lymphocytes Relative: 23 % (ref 12.0–46.0)
Lymphs Abs: 1.6 10*3/uL (ref 0.7–4.0)
MCHC: 34.1 g/dL (ref 30.0–36.0)
MCV: 87.7 fl (ref 78.0–100.0)
Monocytes Absolute: 0.5 10*3/uL (ref 0.1–1.0)
Monocytes Relative: 7.6 % (ref 3.0–12.0)
Neutro Abs: 4.3 10*3/uL (ref 1.4–7.7)
Neutrophils Relative %: 60.4 % (ref 43.0–77.0)
Platelets: 264 10*3/uL (ref 150.0–400.0)
RBC: 4.84 Mil/uL (ref 3.87–5.11)
RDW: 13.5 % (ref 11.5–15.5)
WBC: 7.1 10*3/uL (ref 4.0–10.5)

## 2022-09-27 LAB — VITAMIN D 25 HYDROXY (VIT D DEFICIENCY, FRACTURES): VITD: 30.26 ng/mL (ref 30.00–100.00)

## 2022-09-27 LAB — COMPREHENSIVE METABOLIC PANEL
ALT: 9 U/L (ref 0–35)
AST: 13 U/L (ref 0–37)
Albumin: 4.1 g/dL (ref 3.5–5.2)
Alkaline Phosphatase: 69 U/L (ref 39–117)
BUN: 10 mg/dL (ref 6–23)
CO2: 27 mEq/L (ref 19–32)
Calcium: 9.6 mg/dL (ref 8.4–10.5)
Chloride: 104 mEq/L (ref 96–112)
Creatinine, Ser: 0.85 mg/dL (ref 0.40–1.20)
GFR: 70.75 mL/min (ref >60.00)
Glucose, Bld: 110 mg/dL — ABNORMAL HIGH (ref 70–99)
Potassium: 4.6 mEq/L (ref 3.5–5.1)
Sodium: 139 mEq/L (ref 135–145)
Total Bilirubin: 0.6 mg/dL (ref 0.2–1.2)
Total Protein: 6.9 g/dL (ref 6.0–8.3)

## 2022-09-27 LAB — HEMOGLOBIN A1C: Hgb A1c MFr Bld: 5.5 % (ref 4.6–6.5)

## 2022-09-27 LAB — LIPID PANEL
Cholesterol: 208 mg/dL — ABNORMAL HIGH (ref 0–200)
HDL: 42.7 mg/dL (ref >39.00)
LDL Cholesterol: 137 mg/dL — ABNORMAL HIGH (ref 0–99)
NonHDL: 165.68
Total CHOL/HDL Ratio: 5
Triglycerides: 143 mg/dL (ref 0.0–149.0)
VLDL: 28.6 mg/dL (ref 0.0–40.0)

## 2022-09-27 LAB — TSH: TSH: 1.43 u[IU]/mL (ref 0.35–5.50)

## 2022-09-27 MED ORDER — ALENDRONATE SODIUM 70 MG PO TABS: 70.0000 mg | ORAL_TABLET | ORAL | 3 refills | Status: DC

## 2022-09-27 MED ORDER — PANTOPRAZOLE SODIUM 40 MG PO TBEC: 40.0000 mg | DELAYED_RELEASE_TABLET | Freq: Every day | ORAL | 1 refills | Status: DC | PRN

## 2022-09-27 NOTE — Assessment & Plan Note (Signed)
Chronic Intermittent-now occurring on a daily basis-last about 10 minutes.  Face gets red and neck and head sweats profusely No other symptoms Occurs daily in the night and occurs randomly Has seen cardiology-not cardiac Has seen GYN-not gynecological Would like referral for endocrine-will refer Check TSH today

## 2022-09-27 NOTE — Assessment & Plan Note (Addendum)
Chronic DEXA deferred-reviewed last DEXA Discussed recommendation to consider starting treatment-she is willing to try Fosamax-start 70 mg once a week Plan to check DEXA in 1 year Stressed regular exercise Continue calcium and vitamin D supplement

## 2022-09-27 NOTE — Assessment & Plan Note (Signed)
Chronic Taking vitamin D daily Check vitamin D level  

## 2022-09-27 NOTE — Assessment & Plan Note (Addendum)
Chronic Encouraged regular exercise, healthy diet and decreased portions 

## 2022-09-27 NOTE — Assessment & Plan Note (Signed)
Chronic Check a1c Low sugar / carb diet Stressed regular exercise  

## 2022-09-27 NOTE — Assessment & Plan Note (Signed)
Chronic Steroids did Still having left upper arm/shoulder pain Refer to orthopedics for further evaluation

## 2022-09-27 NOTE — Assessment & Plan Note (Signed)
Chronic Check lipid panel, TSH Stressed regular exercise, healthy diet Coronary artery calcium score is 0 Continue lifestyle control

## 2022-09-27 NOTE — Assessment & Plan Note (Signed)
Chronic ?GERD controlled ?Continue pantoprazole 40 mg daily prn ? ?

## 2022-09-27 NOTE — Assessment & Plan Note (Signed)
Chronic Blood pressure well controlled CMP Continue nebivolol 10 mg daily 

## 2022-10-24 ENCOUNTER — Other Ambulatory Visit: Payer: Self-pay | Admitting: Internal Medicine

## 2022-10-25 ENCOUNTER — Telehealth: Payer: Self-pay | Admitting: Internal Medicine

## 2022-10-25 NOTE — Telephone Encounter (Signed)
Pharmacy states that when they try to enter the Clonazepam  prescription it says that the DEA has been revoked. Please advise.

## 2022-10-25 NOTE — Telephone Encounter (Signed)
Patient called in requesting update on refill.   Dr. Maple Hudson, please advise. Thanks

## 2022-10-25 NOTE — Telephone Encounter (Signed)
Spoke a Associate Professor at [employer] and she stated the Dr's DEA number has been revoked when they try and fill clonazePAM (KLONOPIN) 1 MG. I will send Dr Maple Hudson a message and see if he can send the refill again for pt.

## 2022-10-25 NOTE — Telephone Encounter (Signed)
Dr Maple Hudson is working on fixing the issue with the DEA number.

## 2022-10-25 NOTE — Telephone Encounter (Signed)
Noted. ?Patient is aware and voiced her understanding.  ?Nothing further needed.  ? ?

## 2022-10-25 NOTE — Telephone Encounter (Signed)
Clonazepam refilled 

## 2022-10-25 NOTE — Telephone Encounter (Signed)
I wasn't notified as in past, that BNDD just expired (not "revoked"). Ill try to get it squared away tonight.

## 2022-10-25 NOTE — Telephone Encounter (Signed)
Carrie Dalton- here is another. Can you please assist until the issue is resolved?

## 2022-10-26 ENCOUNTER — Other Ambulatory Visit: Payer: Self-pay | Admitting: Internal Medicine

## 2022-10-26 MED ORDER — CLONAZEPAM 1 MG PO TABS: ORAL_TABLET | ORAL | 5 refills | Status: DC

## 2022-10-26 NOTE — Telephone Encounter (Signed)
Sent!

## 2022-10-31 ENCOUNTER — Telehealth: Payer: Self-pay | Admitting: Internal Medicine

## 2022-10-31 MED ORDER — CLONAZEPAM 1 MG PO TABS: ORAL_TABLET | ORAL | 5 refills | Status: DC

## 2022-10-31 NOTE — Telephone Encounter (Signed)
Clonazepam refill 

## 2022-11-18 ENCOUNTER — Ambulatory Visit (INDEPENDENT_AMBULATORY_CARE_PROVIDER_SITE_OTHER): Payer: Medicare Other | Admitting: *Deleted

## 2022-11-18 VITALS — Ht 62.0 in | Wt 176.0 lb

## 2022-11-18 DIAGNOSIS — Z0000 Encounter for general adult medical examination without abnormal findings: Secondary | ICD-10-CM | POA: Diagnosis not present

## 2022-11-18 NOTE — Progress Notes (Signed)
Subjective:   Carrie Dalton is a 67 y.o. female who presents for Medicare Annual (Subsequent) preventive examination.  Visit Complete: Virtual  I connected with  Roderic Palau Castelli on 11/18/22 by a audio enabled telemedicine application and verified that I am speaking with the correct person using two identifiers.  Patient Location: Home  Provider Location: Office/Clinic  I discussed the limitations of evaluation and management by telemedicine. The patient expressed understanding and agreed to proceed.  Patient Medicare AWV questionnaire was not completed by the patient. I have confirmed that all information answered by patient is correct and no changes since this date.  Review of Systems    Defer to PCP  Cardiac Risk Factors include: advanced age (>14men, >19 women)    Please see problem list for additional risk factors Objective:    Today's Vitals   11/18/22 1048  Weight: 176 lb (79.8 kg)  Height: 5\' 2"  (1.575 m)   Body mass index is 32.19 kg/m.     11/18/2022   10:52 AM 11/03/2021   12:07 PM 08/29/2017    3:13 PM 05/09/2017    8:42 AM 05/04/2017   11:14 AM  Advanced Directives  Does Patient Have a Medical Advance Directive? No No No No No  Would patient like information on creating a medical advance directive? No - Patient declined No - Patient declined No - Patient declined No - Patient declined     Current Medications (verified) Outpatient Encounter Medications as of 11/18/2022  Medication Sig   albuterol (VENTOLIN HFA) 108 (90 Base) MCG/ACT inhaler INHALE 2 PUFFS INTO THE LUNGS EVERY 6 HOURS AS NEEDED FOR WHEEZING OR SHORTNESS OF BREATH.   alendronate (FOSAMAX) 70 MG tablet Take 1 tablet (70 mg total) by mouth every 7 (seven) days. Take with a full glass of water on an empty stomach.   aspirin 81 MG tablet Take 81 mg by mouth daily.   budesonide-formoterol (SYMBICORT) 80-4.5 MCG/ACT inhaler Inhale 2 puffs into the lungs 2 (two) times daily as needed.   cholecalciferol  (VITAMIN D) 1000 UNITS tablet Take 1,000 Units by mouth daily.   clonazePAM (KLONOPIN) 1 MG tablet TAKE 2 TABLETS BY MOUTH AT BEDTIME FOR SLEEP   Fluticasone-Umeclidin-Vilant (TRELEGY ELLIPTA) 100-62.5-25 MCG/ACT AEPB Inhale 1 puff into the lungs daily.   Nebivolol HCl 20 MG TABS Take 1/2 (one-half) tablet by mouth once daily   pantoprazole (PROTONIX) 40 MG tablet Take 1 tablet (40 mg total) by mouth daily as needed.   phenazopyridine (PYRIDIUM) 200 MG tablet Take 1 tablet (200 mg total) by mouth 2 (two) times daily as needed.   triamcinolone (KENALOG) 0.1 % Apply 1 application topically as needed.   clotrimazole (LOTRIMIN) 1 % cream Apply 1 Application topically 2 (two) times daily. (Patient not taking: Reported on 11/18/2022)   No facility-administered encounter medications on file as of 11/18/2022.    Allergies (verified) Codeine, Compazine, Lunesta [eszopiclone], Merthiolate [thimerosal (thiomersal)], Nickel, and Prochlorperazine edisylate   History: Past Medical History:  Diagnosis Date   ASTHMA, UNSPECIFIED, UNSPECIFIED STATUS    Closed fracture of right distal fibula 05/09/2017   DEGENERATIVE JOINT DISEASE    Family history of adverse reaction to anesthesia    ponv   Hyperlipidemia    Hypertension    NECK PAIN 11/05/2009   OSTEOPENIA    POSITIVE PPD    SLEEP DISORDER, HX OF    UNSPECIFIED PAROXYSMAL TACHYCARDIA    Past Surgical History:  Procedure Laterality Date   CESAREAN SECTION  1984   DILATION AND CURETTAGE OF UTERUS     x 3   ORIF FIBULA FRACTURE Right 05/09/2017   Procedure: OPEN REDUCTION INTERNAL FIXATION (ORIF) RIGHT DISTAL FIBULA FRACTURE;  Surgeon: Teryl Lucy, MD;  Location: [facility];  Service: Orthopedics;  Laterality: Right;   Family History  Problem Relation Age of Onset   Heart failure Mother    Stroke Mother    Atrial fibrillation Mother    Heart attack Father    Heart failure Father    Diabetes Father    Hypertension Father     Pancreatitis Daughter    Heart disease Daughter 8       Restrictive cardiomyopathy status post heart transplant   Atrial fibrillation Maternal Grandmother    Heart failure Maternal Grandmother        CHF   Hypertension Maternal Grandmother    Prostate cancer Maternal Grandfather    Heart attack Brother 37       half-brother   Breast cancer Neg Hx    Social History   Socioeconomic History   Marital status: Married    Spouse name: Not on file   Number of children: 3   Years of education: Not on file   Highest education level: Not on file  Occupational History   Occupation: PATIENT CARE COORDIN    Employer: [employer]  Tobacco Use   Smoking status: Former    Packs/day: .5    Types: Cigarettes    Quit date: 1992    Years since quitting: 32.5   Smokeless tobacco: Never  Vaping Use   Vaping Use: Never used  Substance and Sexual Activity   Alcohol use: No   Drug use: No   Sexual activity: Not Currently  Other Topics Concern   Not on file  Social History Narrative   Not on file   Social Determinants of Health   Financial Resource Strain: Low Risk  (11/18/2022)   Overall Financial Resource Strain (CARDIA)    Difficulty of Paying Living Expenses: Not hard at all  Food Insecurity: No Food Insecurity (11/18/2022)   Hunger Vital Sign    Worried About Running Out of Food in the Last Year: Never true    Ran Out of Food in the Last Year: Never true  Transportation Needs: No Transportation Needs (11/18/2022)   PRAPARE - Administrator, Civil Service (Medical): No    Lack of Transportation (Non-Medical): No  Physical Activity: Sufficiently Active (11/18/2022)   Exercise Vital Sign    Days of Exercise per Week: 7 days    Minutes of Exercise per Session: 40 min  Stress: No Stress Concern Present (11/18/2022)   [employer] of Occupational Health - Occupational Stress Questionnaire    Feeling of Stress : Not at all  Social Connections: Socially  Integrated (11/18/2022)   Social Connection and Isolation Panel [NHANES]    Frequency of Communication with Friends and Family: More than three times a week    Frequency of Social Gatherings with Friends and Family: More than three times a week    Attends Religious Services: More than 4 times per year    Active Member of [employer] or Organizations: Yes    Attends Engineer, structural: More than 4 times per year    Marital Status: Married    Tobacco Counseling Counseling given: Not Answered   Clinical Intake:  Pre-visit preparation completed: Yes  Pain : No/denies pain     Nutritional Status: BMI >  30  Obese Diabetes: No  How often do you need to have someone help you when you read instructions, pamphlets, or other written materials from your doctor or pharmacy?: 1 - Never What is the last grade level you completed in school?: 2 years college  Interpreter Needed?: No      Activities of Daily Living    11/18/2022   10:59 AM 11/18/2022   10:58 AM  In your present state of health, do you have any difficulty performing the following activities:  Hearing?  0  Vision?  0  Difficulty concentrating or making decisions?  0  Walking or climbing stairs?  0  Dressing or bathing?  0  Doing errands, shopping?  0  Preparing Food and eating ? N   Using the Toilet? N   In the past six months, have you accidently leaked urine? N   Do you have problems with loss of bowel control? N   Managing your Medications? N   Managing your Finances? N   Housekeeping or managing your Housekeeping? N     Patient Care Team: Pincus Sanes, MD as PCP - General (Internal Medicine) End, Cristal Deer, MD as PCP - Cardiology (Cardiology) Rachael Fee, MD (Gastroenterology) Waymon Budge, MD (Pulmonary Disease) Huel Cote, MD (Obstetrics and Gynecology)  Indicate any recent Medical Services you may have received from other than [facility] providers in the past year (date may be  approximate).     Assessment:   This is a routine wellness examination for [place].  Hearing/Vision screen Vision Screening - Comments:: Annual eye exam, patient has not been this year.  Dietary issues and exercise activities discussed: Current Exercise Habits: Home exercise routine, Type of exercise: walking, Time (Minutes): 40, Frequency (Times/Week): 7, Weekly Exercise (Minutes/Week): 280, Intensity: Mild   Goals Addressed               This Visit's Progress     Patient Stated (pt-stated)        Patient states her goals is to stay active, increase ambulating       Depression Screen    11/18/2022   10:57 AM 11/18/2022   10:52 AM 09/27/2022   10:28 AM 03/28/2022   10:58 AM 11/03/2021   12:09 PM 08/30/2021   11:21 AM 07/27/2020   12:38 PM  PHQ 2/9 Scores  PHQ - 2 Score 0 0 0 0 0 0 0  PHQ- 9 Score   0 0   1    Fall Risk    11/18/2022   10:52 AM 09/27/2022   10:28 AM 03/28/2022   10:58 AM 11/03/2021   12:04 PM 08/30/2021   11:21 AM  Fall Risk   Falls in the past year? 0 0 0 0 0  Number falls in past yr: 0 0 0 0   Injury with Fall? 0 0 0 0   Risk for fall due to : No Fall Risks No Fall Risks No Fall Risks    Follow up Falls evaluation completed Falls evaluation completed Falls evaluation completed Education provided;Falls prevention discussed;Falls evaluation completed     MEDICARE RISK AT HOME:  Medicare Risk at Home - 11/18/22 1101     Grab bars in the bathroom? Yes             TIMED UP AND GO:  Was the test performed?  No    Cognitive Function:        11/18/2022   11:04 AM 11/03/2021   12:05 PM  6CIT Screen  What Year? 0 points 0 points  What month? 0 points 0 points  What time? 0 points 0 points  Count back from 20 0 points 0 points  Months in reverse 0 points 0 points  Repeat phrase 0 points 0 points  Total Score 0 points 0 points    Immunizations Immunization History  Administered Date(s) Administered   Influenza Split 02/21/2012    Influenza,inj,Quad PF,6+ Mos 02/20/2013   Influenza-Unspecified 02/17/2015, 02/16/2016, 02/22/2017   PFIZER(Purple Top)SARS-COV-2 Vaccination 06/04/2019, 06/25/2019   PNEUMOCOCCAL CONJUGATE-20 07/27/2020   Pneumococcal Polysaccharide-23 11/05/2013   Td 08/28/2008   Tdap 01/11/2016   Zoster Recombinat (Shingrix) 09/14/2017, 07/26/2019   Zoster, Live 02/17/2015    TDAP status: Up to date  Flu Vaccine status: Up to date  Pneumococcal vaccine status: Up to date  Covid-19 vaccine status: Completed vaccines  Qualifies for Shingles Vaccine? Yes   Zostavax completed Yes   Shingrix Completed?: Yes  Screening Tests Health Maintenance  Topic Date Due   MAMMOGRAM  12/17/2021   COVID-19 Vaccine (3 - 2023-24 season) 01/21/2022   DEXA SCAN  09/27/2023 (Originally 08/06/2022)   Colonoscopy  09/27/2023 (Originally 01/12/2000)   INFLUENZA VACCINE  12/22/2022   Medicare Annual Wellness (AWV)  11/18/2023   DTaP/Tdap/Td (3 - Td or Tdap) 01/10/2026   Pneumonia Vaccine 65+ Years old  Completed   Hepatitis C Screening  Completed   Zoster Vaccines- Shingrix  Completed   HPV VACCINES  Aged Out    Health Maintenance  Health Maintenance Due  Topic Date Due   MAMMOGRAM  12/17/2021   COVID-19 Vaccine (3 - 2023-24 season) 01/21/2022    Colorectal cancer screening: No longer required.  Patient refused  Mammogram status: Completed patient states she got her Mammogram done last year, waiting on report. Repeat every year  Bone density scan- patient stated she wanted to wait on this scan. Talked to her doctor about not getting it right now    Lung Cancer Screening: (Low Dose CT Chest recommended if Age 67-80 years, 20 pack-year currently smoking OR have quit w/in 15years.) does not qualify.   Lung Cancer Screening Referral: na/  Additional Screening:  Hepatitis C Screening: does qualify; Completed 07/07/2017  Vision Screening: Recommended annual ophthalmology exams for early detection of  glaucoma and other disorders of the eye. Is the patient up to date with their annual eye exam?  Yes  Who is the provider or what is the name of the office in which the patient attends annual eye exams? Patient is currently looking for a eye doctor If pt is not established with a provider, would they like to be referred to a provider to establish care? No .   Dental Screening: Recommended annual dental exams for proper oral hygiene  Diabetic Foot Exam: Diabetic Foot Exam: Overdue, Pt has been advised about the importance in completing this exam. Pt is scheduled for diabetic foot exam on .04/06/2023.  Community Resource Referral / Chronic Care Management: CRR required this visit?  No   CCM required this visit?  No     Plan:     I have personally reviewed and noted the following in the patient's chart:   Medical and social history Use of alcohol, tobacco or illicit drugs  Current medications and supplements including opioid prescriptions. Patient is not currently taking opioid prescriptions. Functional ability and status Nutritional status Physical activity Advanced directives List of other physicians Hospitalizations, surgeries, and ER visits in previous 12 months Vitals Screenings to  include cognitive, depression, and falls Referrals and appointments  In addition, I have reviewed and discussed with patient certain preventive protocols, quality metrics, and best practice recommendations. A written personalized care plan for preventive services as well as general preventive health recommendations were provided to patient.     Tamela Oddi, CMA   11/18/2022   After Visit Summary: (Mail) Due to this being a telephonic visit, the after visit summary with patients personalized plan was offered to patient via mail   Nurse Notes:  Ms. Matuska , Thank you for taking time to come for your Medicare Wellness Visit. I appreciate your ongoing commitment to your health goals. Please  review the following plan we discussed and let me know if I can assist you in the future.   These are the goals we discussed:  Goals       Increase physical activity      Loose  weight      Patient Stated (pt-stated)      Patient states her goals is to stay active, increase ambulating         This is a list of the screening recommended for you and due dates:  Health Maintenance  Topic Date Due   Mammogram  12/17/2021   COVID-19 Vaccine (3 - 2023-24 season) 01/21/2022   DEXA scan (bone density measurement)  09/27/2023*   Colon Cancer Screening  09/27/2023*   Flu Shot  12/22/2022   Medicare Annual Wellness Visit  11/18/2023   DTaP/Tdap/Td vaccine (3 - Td or Tdap) 01/10/2026   Pneumonia Vaccine  Completed   Hepatitis C Screening  Completed   Zoster (Shingles) Vaccine  Completed   HPV Vaccine  Aged Out  *Topic was postponed. The date shown is not the original due date.

## 2023-01-20 ENCOUNTER — Other Ambulatory Visit: Payer: Self-pay | Admitting: Internal Medicine

## 2023-01-27 ENCOUNTER — Other Ambulatory Visit: Payer: Self-pay | Admitting: Internal Medicine

## 2023-03-21 IMAGING — US US BREAST*R* LIMITED INC AXILLA
1 series · 4 of 4 positions shown · non-contrast
Comparison: Previous exam(s).

ACR Breast Density Category a: The breast tissue is almost entirely
fatty.

CLINICAL DATA: Palpable lump along the far upper inner right
breast, which the patient has felt for years, with some associated
tenderness.

EXAM:
DIGITAL DIAGNOSTIC UNILATERAL RIGHT MAMMOGRAM WITH TOMOSYNTHESIS AND
CAD; ULTRASOUND RIGHT BREAST LIMITED
TECHNIQUE: Right digital diagnostic mammography and breast tomosynthesis was
performed. The images were evaluated with computer-aided detection.;
Targeted ultrasound examination of the right breast was performed

[Series 1: us breast*right* limited inc axilla · 0.06mm/px · 4 of 4 slices shown]
[im 1/4]
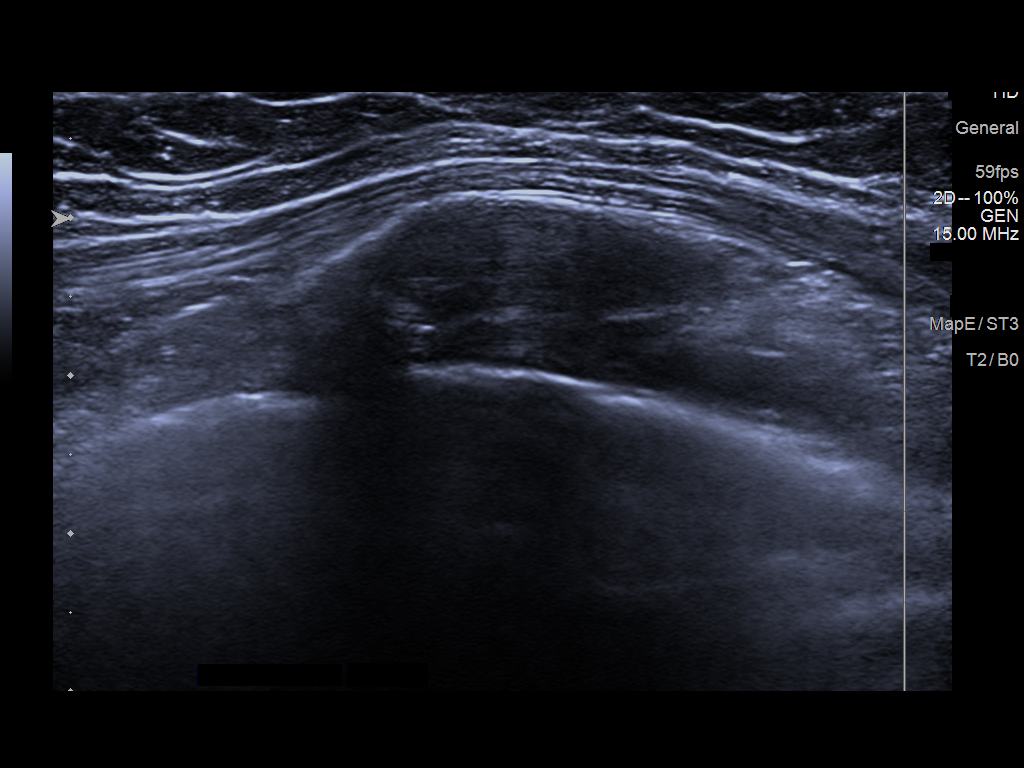
[im 2/4]
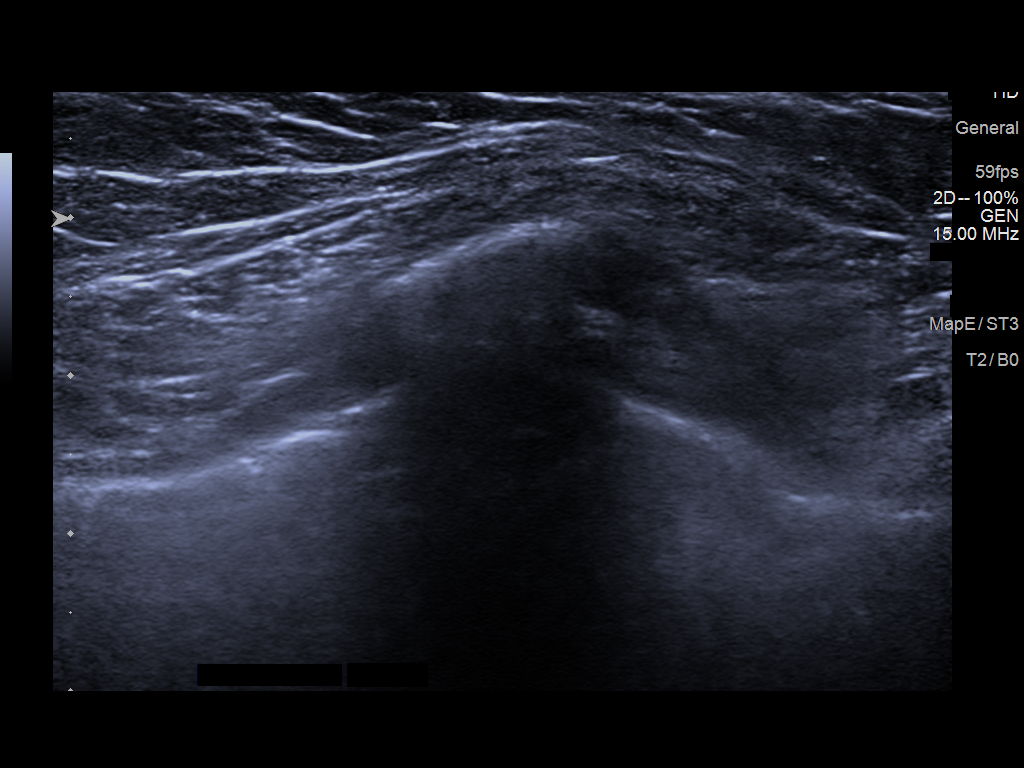
[im 3/4]
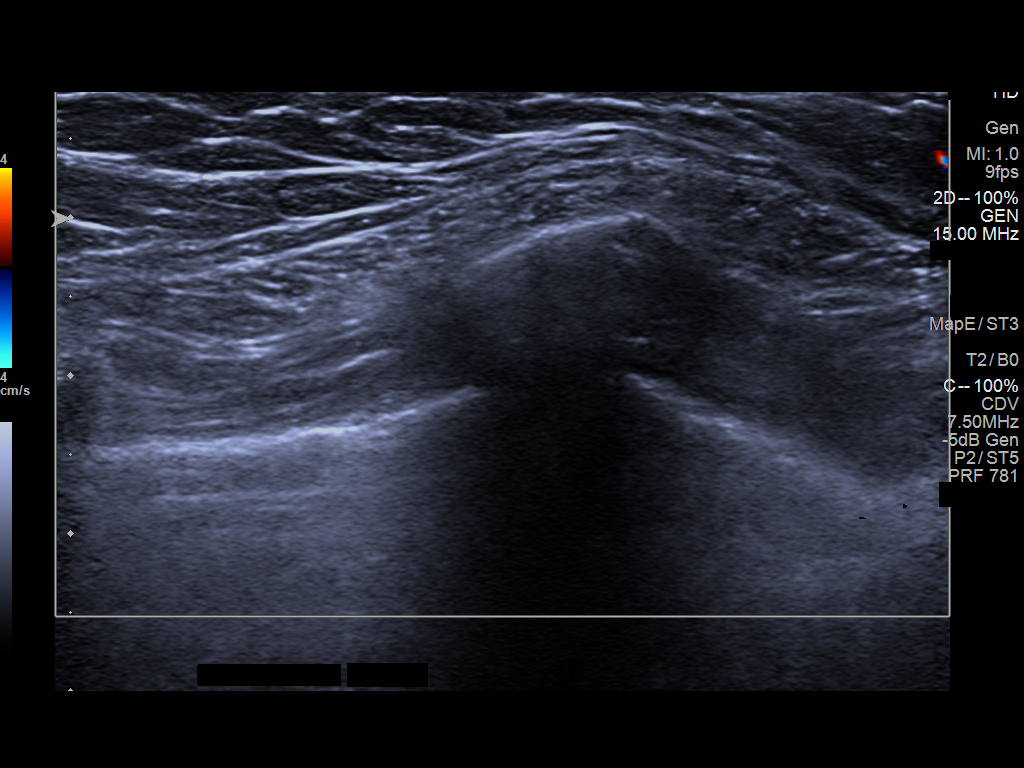
[im 4/4]
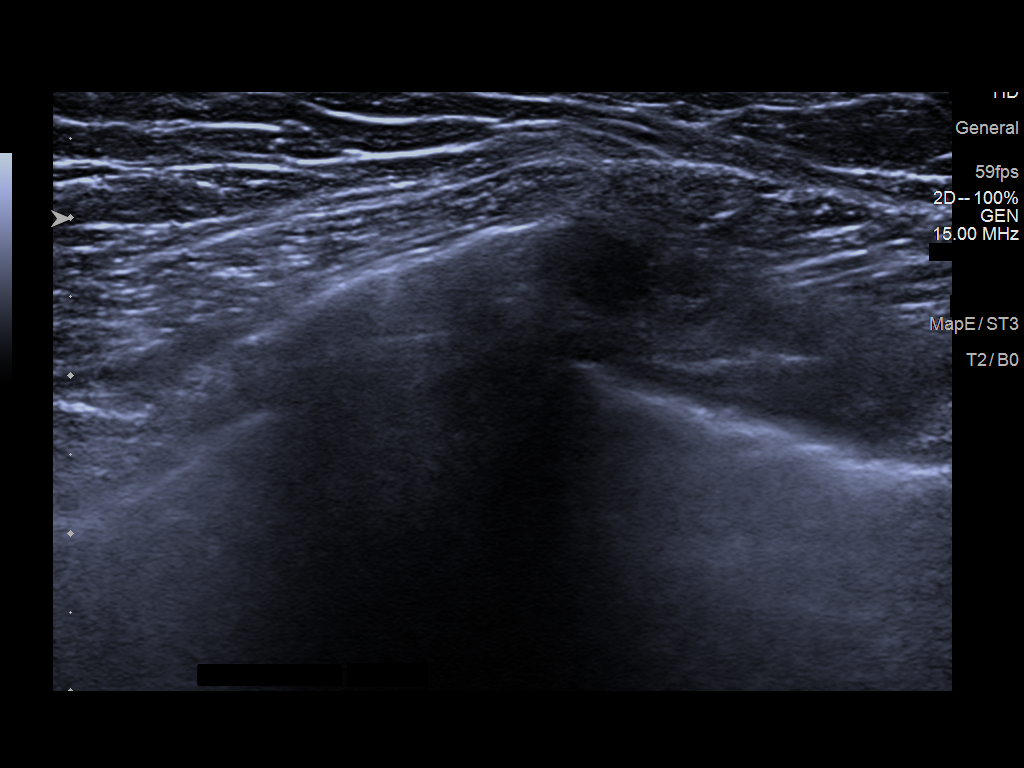

[4 of 4 positions shown; findings below may reference images not displayed]

FINDINGS: There are no masses, areas of architectural distortion, areas of
significant asymmetry or suspicious calcifications. No mammographic
change.

On physical exam, there is a firm smooth lump along the upper inner
peripheral margin of the right breast, at is consistent with the
underlying costal cartilage. There is no defined breast mass.

Targeted ultrasound is performed, showing normal tissue the far
upper inner right breast in the area of the palpable abnormality.
Palpable abnormality corresponds to underlying rib cartilage.
IMPRESSION: 1. No evidence of breast malignancy.
2. Palpable abnormality appears to correspond to a prominent right
rib cartilage.

RECOMMENDATION:
Annual screening mammography. Last screening study performed on
05/10/2021.

I have discussed the findings and recommendations with the patient.
If applicable, a reminder letter will be sent to the patient
regarding the next appointment.

BI-RADS CATEGORY  1: Negative.

## 2023-03-26 ENCOUNTER — Encounter: Payer: Self-pay | Admitting: Internal Medicine

## 2023-03-26 NOTE — Progress Notes (Unsigned)
Subjective:    Patient ID: Carrie Dalton, female    DOB: 04/15/1956, 67 y.o.   MRN: [id number]     HPI Bonita is here for follow up of her chronic medical problems.  Has a dry spot on her right forearm - has been there 5-6 months.  She is wondering if she should see dermatology.    Medications and allergies reviewed with patient and updated if appropriate.  Current Outpatient Medications on File Prior to Visit  Medication Sig Dispense Refill   albuterol (VENTOLIN HFA) 108 (90 Base) MCG/ACT inhaler INHALE 2 PUFFS INTO THE LUNGS EVERY 6 HOURS AS NEEDED FOR WHEEZING OR SHORTNESS OF BREATH. 18 g 12   alendronate (FOSAMAX) 70 MG tablet Take 1 tablet (70 mg total) by mouth every 7 (seven) days. Take with a full glass of water on an empty stomach. 12 tablet 3   aspirin 81 MG tablet Take 81 mg by mouth daily.     budesonide-formoterol (SYMBICORT) 80-4.5 MCG/ACT inhaler Inhale 2 puffs into the lungs 2 (two) times daily as needed.     cholecalciferol (VITAMIN D) 1000 UNITS tablet Take 1,000 Units by mouth daily.     clonazePAM (KLONOPIN) 1 MG tablet TAKE 2 TABLETS BY MOUTH AT BEDTIME FOR SLEEP 60 tablet 5   Fluticasone-Umeclidin-Vilant (TRELEGY ELLIPTA) 100-62.5-25 MCG/ACT AEPB Inhale 1 puff into the lungs daily. 14 each 0   Nebivolol HCl 20 MG TABS Take 1/2 (one-half) tablet by mouth once daily 45 tablet 0   pantoprazole (PROTONIX) 40 MG tablet Take 1 tablet (40 mg total) by mouth daily as needed. 90 tablet 1   phenazopyridine (PYRIDIUM) 200 MG tablet Take 1 tablet (200 mg total) by mouth 2 (two) times daily as needed. 60 tablet 11   triamcinolone (KENALOG) 0.1 % Apply 1 application topically as needed.     No current facility-administered medications on file prior to visit.     Review of Systems  Constitutional:  Negative for fever.  Respiratory:  Negative for cough, shortness of breath and wheezing.   Cardiovascular:  Positive for leg swelling (right ankle from prior fx).  Negative for chest pain and palpitations.  Gastrointestinal:        Genella Rife controlled  Neurological:  Positive for headaches (occ). Negative for light-headedness.       Objective:   Vitals:   03/27/23 1050  BP: 126/74  Pulse: 67  Temp: 98.4 F (36.9 C)  SpO2: 97%   BP Readings from Last 3 Encounters:  03/27/23 126/74  09/27/22 126/78  05/30/22 120/70   Wt Readings from Last 3 Encounters:  03/27/23 186 lb (84.4 kg)  11/18/22 176 lb (79.8 kg)  09/27/22 179 lb (81.2 kg)   Body mass index is 34.02 kg/m.    Physical Exam Constitutional:      General: She is not in acute distress.    Appearance: Normal appearance.  HENT:     Head: Normocephalic and atraumatic.  Eyes:     Conjunctiva/sclera: Conjunctivae normal.  Cardiovascular:     Rate and Rhythm: Normal rate and regular rhythm.     Heart sounds: Normal heart sounds.  Pulmonary:     Effort: Pulmonary effort is normal. No respiratory distress.     Breath sounds: Normal breath sounds. No wheezing.  Musculoskeletal:     Cervical back: Neck supple.     Right lower leg: No edema.     Left lower leg: No edema.  Lymphadenopathy:  Cervical: No cervical adenopathy.  Skin:    General: Skin is warm and dry.     Findings: No rash.  Neurological:     Mental Status: She is alert. Mental status is at baseline.  Psychiatric:        Mood and Affect: Mood normal.        Behavior: Behavior normal.        Lab Results  Component Value Date   WBC 7.1 09/27/2022   HGB 14.5 09/27/2022   HCT 42.5 09/27/2022   PLT 264.0 09/27/2022   GLUCOSE 110 (H) 09/27/2022   CHOL 208 (H) 09/27/2022   TRIG 143.0 09/27/2022   HDL 42.70 09/27/2022   LDLDIRECT 161.1 03/13/2012   LDLCALC 137 (H) 09/27/2022   ALT 9 09/27/2022   AST 13 09/27/2022   NA 139 09/27/2022   K 4.6 09/27/2022   CL 104 09/27/2022   CREATININE 0.85 09/27/2022   BUN 10 09/27/2022   CO2 27 09/27/2022   TSH 1.43 09/27/2022   PSA WNL 03/24/2014   HGBA1C 5.5  09/27/2022   The 10-year ASCVD risk score (Arnett DK, et al., 2019) is: 10.8%   Values used to calculate the score:     Age: 42 years     Sex: Female     Is Non-Hispanic African American: No     Diabetic: No     Tobacco smoker: No     Systolic Blood Pressure: 126 mmHg     Is BP treated: Yes     HDL Cholesterol: 42.7 mg/dL     Total Cholesterol: 208 mg/dL   Assessment & Plan:    See Problem List for Assessment and Plan of chronic medical problems.

## 2023-03-26 NOTE — Patient Instructions (Addendum)
      Blood work was ordered.   The lab is on the first floor.    Medications changes include :  none       Return in about 6 months (around 09/24/2023) for follow up.

## 2023-03-27 ENCOUNTER — Ambulatory Visit (INDEPENDENT_AMBULATORY_CARE_PROVIDER_SITE_OTHER): Payer: Medicare Other | Admitting: Internal Medicine

## 2023-03-27 VITALS — BP 126/74 | HR 67 | Temp 98.4°F | Ht 62.0 in | Wt 186.0 lb

## 2023-03-27 DIAGNOSIS — R7303 Prediabetes: Secondary | ICD-10-CM

## 2023-03-27 DIAGNOSIS — K219 Gastro-esophageal reflux disease without esophagitis: Secondary | ICD-10-CM | POA: Diagnosis not present

## 2023-03-27 DIAGNOSIS — M85851 Other specified disorders of bone density and structure, right thigh: Secondary | ICD-10-CM | POA: Diagnosis not present

## 2023-03-27 DIAGNOSIS — I10 Essential (primary) hypertension: Secondary | ICD-10-CM | POA: Diagnosis not present

## 2023-03-27 DIAGNOSIS — E7849 Other hyperlipidemia: Secondary | ICD-10-CM

## 2023-03-27 LAB — COMPREHENSIVE METABOLIC PANEL
ALT: 10 U/L (ref 0–35)
AST: 14 U/L (ref 0–37)
Albumin: 4.1 g/dL (ref 3.5–5.2)
Alkaline Phosphatase: 69 U/L (ref 39–117)
BUN: 12 mg/dL (ref 6–23)
CO2: 29 meq/L (ref 19–32)
Calcium: 9.8 mg/dL (ref 8.4–10.5)
Chloride: 105 meq/L (ref 96–112)
Creatinine, Ser: 0.91 mg/dL (ref 0.40–1.20)
GFR: 64.96 mL/min (ref >60.00)
Glucose, Bld: 106 mg/dL — ABNORMAL HIGH (ref 70–99)
Potassium: 4.9 meq/L (ref 3.5–5.1)
Sodium: 140 meq/L (ref 135–145)
Total Bilirubin: 0.3 mg/dL (ref 0.2–1.2)
Total Protein: 6.9 g/dL (ref 6.0–8.3)

## 2023-03-27 LAB — LIPID PANEL
Cholesterol: 211 mg/dL — ABNORMAL HIGH (ref 0–200)
HDL: 50.5 mg/dL (ref >39.00)
LDL Cholesterol: 138 mg/dL — ABNORMAL HIGH (ref 0–99)
NonHDL: 160.64
Total CHOL/HDL Ratio: 4
Triglycerides: 111 mg/dL (ref 0.0–149.0)
VLDL: 22.2 mg/dL (ref 0.0–40.0)

## 2023-03-27 LAB — HEMOGLOBIN A1C: Hgb A1c MFr Bld: 5.5 % (ref 4.6–6.5)

## 2023-03-27 NOTE — Assessment & Plan Note (Signed)
Chronic Check lipid panel Stressed regular exercise, healthy diet Coronary artery calcium score is 0 Elevated ASCVD risk Prefers to not take any medication Continue lifestyle control

## 2023-03-27 NOTE — Assessment & Plan Note (Signed)
Chronic Check a1c Low sugar / carb diet Stressed regular exercise  

## 2023-03-27 NOTE — Assessment & Plan Note (Signed)
Chronic Blood pressure well controlled CMP Continue nebivolol 10 mg daily 

## 2023-03-27 NOTE — Assessment & Plan Note (Addendum)
Chronic Taking fosamax 70 mg weekly - started 09/2022-has had some bony pain related to that Initially was not taking calcium and vitamin D, but did restart it-stressed she needs to be taking that daily Bone pain has improved so she will continue the medication for now-if bone pain persists may need to hold medication to see if bone pain goes away

## 2023-03-27 NOTE — Assessment & Plan Note (Signed)
Chronic GERD controlled Continue pantoprazole 40 mg daily prn  

## 2023-05-18 ENCOUNTER — Other Ambulatory Visit: Payer: Self-pay | Admitting: Internal Medicine

## 2023-05-19 ENCOUNTER — Telehealth: Payer: Self-pay

## 2023-05-19 MED ORDER — CLONAZEPAM 1 MG PO TABS: ORAL_TABLET | ORAL | 5 refills | Status: DC

## 2023-05-19 NOTE — Telephone Encounter (Signed)
Clonazepam refilled 

## 2023-05-19 NOTE — Telephone Encounter (Signed)
Pt is aware and voiced her understanding. Nothing further needed.

## 2023-05-19 NOTE — Telephone Encounter (Signed)
Pt called in. She stated that Clonazaepam was sent to [facility] on 04/23/2023 with 5 refills, however expiration date for Rx is 04/23/2023. However after researching her chart, it looks like last Rx was sent 10/31/2022 with 5 refills with expiration date of 04/29/2023. It appears that a new Rx is needed. Preferred pharmacy is [facility] in [place].    Dr. Maple Hudson, please advise. Thanks

## 2023-05-19 NOTE — Telephone Encounter (Signed)
Pt is requesting refill. The rx has a note that the RX was renewed today. Please advise.

## 2023-06-30 ENCOUNTER — Ambulatory Visit: Payer: Medicare Other | Attending: Internal Medicine | Admitting: Internal Medicine

## 2023-06-30 NOTE — Progress Notes (Deleted)
  Cardiology Office Note:  .   Date:  06/30/2023  ID:  Carrie Dalton, DOB [birth year], MRN [id number] PCP: Geofm Glade JINNY, MD  [facility] Providers Cardiologist:  Lonni Hanson, MD { Click to update primary MD,subspecialty MD or APP then REFRESH:1}    History of Present Illness: .   Carrie Dalton is a 67 y.o. female with history of PSVT, hypertension, hyperlipidemia, prediabetes, asthma, GERD, and osteopenia, who presents for follow-up of PSVT.  I last saw her in 08/2021, at which time she was feeling well without further palpitations, chest pain, or flushing.  She was continued on nebivolol ; no medication changes or additional testing were pursued.  ROS: See HPI  Studies Reviewed: SABRA        EP Procedures and Devices: Event monitor (05/06/2021): Predominantly sinus rhythm with rare PACs and PVCs.  Brief episodes of PSVT and NSVT noted, lasting up to 10 beats and 7 beats, respectively.   Non-Invasive Evaluation(s): TTE (05/06/2021): Normal LV size and wall thickness.  LVEF 55-60% with grade 1 diastolic dysfunction.  Normal RV size and function.  Normal biatrial size.  Trivial mitral regurgitation.  No significant valvular abnormalities. Coronary CTA (04/12/2021): Normal study without CAD.  CAC 0.  No significant extracardiac findings.  Risk Assessment/Calculations:   {Does this patient have ATRIAL FIBRILLATION?:[phone]} No BP recorded.  {Refresh Note OR Click here to enter BP  :1}***       Physical Exam:   VS:  There were no vitals taken for this visit.   Wt Readings from Last 3 Encounters:  03/27/23 186 lb (84.4 kg)  11/18/22 176 lb (79.8 kg)  09/27/22 179 lb (81.2 kg)    General:  NAD. Neck: No JVD or HJR. Lungs: Clear to auscultation bilaterally without wheezes or crackles. Heart: Regular rate and rhythm without murmurs, rubs, or gallops. Abdomen: Soft, nontender, nondistended. Extremities: No lower extremity edema.  ASSESSMENT AND PLAN: .    ***    {Are you  ordering a CV Procedure (e.g. stress test, cath, DCCV, TEE, etc)?   Press F2        :[id number]}  Dispo: ***  Signed, Lonni Hanson, MD

## 2023-07-21 ENCOUNTER — Ambulatory Visit: Payer: Medicare Other | Admitting: Student

## 2023-07-21 ENCOUNTER — Other Ambulatory Visit: Payer: Self-pay | Admitting: Internal Medicine

## 2023-08-25 ENCOUNTER — Ambulatory Visit: Payer: Medicare Other | Admitting: Cardiology

## 2023-09-06 ENCOUNTER — Other Ambulatory Visit: Payer: Self-pay | Admitting: Internal Medicine

## 2023-09-18 LAB — HM MAMMOGRAPHY

## 2023-09-21 ENCOUNTER — Encounter: Payer: Self-pay | Admitting: Internal Medicine

## 2023-09-24 ENCOUNTER — Encounter: Payer: Self-pay | Admitting: Internal Medicine

## 2023-09-24 NOTE — Patient Instructions (Addendum)
      Blood work was ordered.       Medications changes include :   None    A referral was ordered and someone will call you to schedule an appointment.     Return in about 6 months (around 03/27/2024) for follow up.

## 2023-09-24 NOTE — Progress Notes (Signed)
      Subjective:    Patient ID: Carrie Dalton, female    DOB: 04/04/1956, 68 y.o.   MRN: [id number]     HPI [name] is here for follow up of her chronic medical problems.  Get dexa?-Due  Medications and allergies reviewed with patient and updated if appropriate.  Current Outpatient Medications on File Prior to Visit  Medication Sig Dispense Refill  . albuterol  (VENTOLIN  HFA) 108 (90 Base) MCG/ACT inhaler INHALE 2 PUFFS INTO THE LUNGS EVERY 6 HOURS AS NEEDED FOR WHEEZING OR SHORTNESS OF BREATH. 18 g 12  . alendronate  (FOSAMAX ) 70 MG tablet TAKE 1 TABLET BY MOUTH ONCE A WEEK. TAKE WITH A FULL GLASS OF WATER ON AN EMPTY STOMACH 12 tablet 0  . aspirin 81 MG tablet Take 81 mg by mouth daily.    . budesonide -formoterol  (SYMBICORT ) 80-4.5 MCG/ACT inhaler Inhale 2 puffs into the lungs 2 (two) times daily as needed.    . cholecalciferol (VITAMIN D ) 1000 UNITS tablet Take 1,000 Units by mouth daily.    . clonazePAM  (KLONOPIN ) 1 MG tablet TAKE 2 TABLETS BY MOUTH AT BEDTIME FOR SLEEP 60 tablet 5  . Fluticasone -Umeclidin-Vilant (TRELEGY ELLIPTA ) 100-62.5-25 MCG/ACT AEPB Inhale 1 puff into the lungs daily. 14 each 0  . Nebivolol  HCl 20 MG TABS Take 1/2 (one-half) tablet by mouth once daily 45 tablet 0  . pantoprazole  (PROTONIX ) 40 MG tablet Take 1 tablet (40 mg total) by mouth daily as needed. 90 tablet 1  . phenazopyridine  (PYRIDIUM ) 200 MG tablet Take 1 tablet (200 mg total) by mouth 2 (two) times daily as needed. 60 tablet 11  . triamcinolone (KENALOG) 0.1 % Apply 1 application topically as needed.     No current facility-administered medications on file prior to visit.     Review of Systems     Objective:  There were no vitals filed for this visit. BP Readings from Last 3 Encounters:  03/27/23 126/74  09/27/22 126/78  05/30/22 120/70   Wt Readings from Last 3 Encounters:  03/27/23 186 lb (84.4 kg)  11/18/22 176 lb (79.8 kg)  09/27/22 179 lb (81.2 kg)   There is no height or  weight on file to calculate BMI.    Physical Exam     Lab Results  Component Value Date   WBC 7.1 09/27/2022   HGB 14.5 09/27/2022   HCT 42.5 09/27/2022   PLT 264.0 09/27/2022   GLUCOSE 106 (H) 03/27/2023   CHOL 211 (H) 03/27/2023   TRIG 111.0 03/27/2023   HDL 50.50 03/27/2023   LDLDIRECT 161.1 03/13/2012   LDLCALC 138 (H) 03/27/2023   ALT 10 03/27/2023   AST 14 03/27/2023   NA 140 03/27/2023   K 4.9 03/27/2023   CL 105 03/27/2023   CREATININE 0.91 03/27/2023   BUN 12 03/27/2023   CO2 29 03/27/2023   TSH 1.43 09/27/2022   PSA WNL 03/24/2014   HGBA1C 5.5 03/27/2023     Assessment & Plan:    See Problem List for Assessment and Plan of chronic medical problems.    This encounter was created in error - please disregard.

## 2023-09-25 ENCOUNTER — Encounter: Payer: Medicare Other | Admitting: Internal Medicine

## 2023-09-25 DIAGNOSIS — K219 Gastro-esophageal reflux disease without esophagitis: Secondary | ICD-10-CM

## 2023-09-25 DIAGNOSIS — R7303 Prediabetes: Secondary | ICD-10-CM

## 2023-09-25 DIAGNOSIS — M810 Age-related osteoporosis without current pathological fracture: Secondary | ICD-10-CM

## 2023-09-25 DIAGNOSIS — I10 Essential (primary) hypertension: Secondary | ICD-10-CM

## 2023-09-25 DIAGNOSIS — E66811 Obesity, class 1: Secondary | ICD-10-CM

## 2023-09-25 DIAGNOSIS — E7849 Other hyperlipidemia: Secondary | ICD-10-CM

## 2023-09-25 DIAGNOSIS — E559 Vitamin D deficiency, unspecified: Secondary | ICD-10-CM

## 2023-09-25 NOTE — Assessment & Plan Note (Signed)
Chronic Encouraged regular exercise, healthy diet and decreased portions 

## 2023-09-25 NOTE — Assessment & Plan Note (Signed)
 Chronic DEXA due Taking fosamax  70 mg weekly - started 09/2022 Taking calcium  and vitamin D  Check vitamin D  level Stressed regular exercise-weightbearing exercise, resistance training

## 2023-09-25 NOTE — Assessment & Plan Note (Signed)
 Chronic Lab Results  Component Value Date   HGBA1C 5.5 03/27/2023   Check a1c Low sugar / carb diet Stressed regular exercise

## 2023-09-25 NOTE — Assessment & Plan Note (Signed)
Chronic Blood pressure well controlled CMP Continue nebivolol 10 mg daily 

## 2023-09-25 NOTE — Assessment & Plan Note (Signed)
 Chronic Check lipid panel Stressed regular exercise, healthy diet Coronary artery calcium score is 0 Elevated ASCVD risk Prefers to not take any medication Continue lifestyle control

## 2023-09-25 NOTE — Assessment & Plan Note (Signed)
 Chronic Taking vitamin D daily Check vitamin D level

## 2023-09-25 NOTE — Assessment & Plan Note (Signed)
Chronic GERD controlled Continue pantoprazole 40 mg daily prn  

## 2023-11-03 ENCOUNTER — Other Ambulatory Visit: Payer: Self-pay | Admitting: Internal Medicine

## 2023-11-07 ENCOUNTER — Encounter: Payer: Self-pay | Admitting: Cardiology

## 2023-11-07 ENCOUNTER — Ambulatory Visit: Attending: Cardiology | Admitting: Physician Assistant

## 2023-11-07 VITALS — BP 142/84 | HR 68 | Ht 62.0 in | Wt 176.0 lb

## 2023-11-07 DIAGNOSIS — R079 Chest pain, unspecified: Secondary | ICD-10-CM | POA: Insufficient documentation

## 2023-11-07 DIAGNOSIS — I10 Essential (primary) hypertension: Secondary | ICD-10-CM | POA: Insufficient documentation

## 2023-11-07 DIAGNOSIS — I471 Supraventricular tachycardia, unspecified: Secondary | ICD-10-CM | POA: Insufficient documentation

## 2023-11-07 DIAGNOSIS — I4729 Other ventricular tachycardia: Secondary | ICD-10-CM | POA: Insufficient documentation

## 2023-11-07 NOTE — Progress Notes (Signed)
 Cardiology Office Note    Date:  11/07/2023   ID:  Carrie Dalton, DOB [birth year], MRN [id number]  PCP:  Colene Dauphin, MD  Cardiologist:  Sammy Crisp, MD  Electrophysiologist:  None   Chief Complaint: Follow up  History of Present Illness:   Carrie Dalton is a 68 y.o. female with history of hypertension, hyperlipidemia, prediabetes, asthma, GERD, and osteopenia who presents for follow up on chest pain, NSVT and PSVT.    Patient was referred by PCP and established with Dr. Nolan Battle 03/2021 for further evaluation of chest tightness with associated shortness of breath, flushing, and diaphoresis.  Coronary CTA done 11/2020 showed calcium  score of 0 with no evidence of CAD.  Echo done 04/2021 showed EF 55 to 60% with G1 DD.  She was seen in clinic 04/2021 and reported occasional flutters in her chest.  ZIO monitor done in 05/2021 showed rare PACs and PVCs as well as a brief episode of PSVT and NSVT lasting up to 10 beats.   Patient was most recently seen by Dr. Nolan Battle 09/03/2021 and was overall doing well from a cardiac perspective.  She denied any further episodes of flushing and chest pain.  No further testing or medication changes were indicated at that time.  Patient seen in clinic today doing well from a cardiac perspective without symptoms of angina or cardiac decompensation.  She reports ongoing episodes of flushing and feeling warm which is being managed by her PCP.  She denies any further episodes of chest pain, palpitations, and shortness of breath.  She denies lightheadedness, dizziness, and lower extremity swelling.  She is scheduled to see her PCP next month and will have labs drawn at that time.  Labs independently reviewed: 03/27/2023-NA 140, K4.9, BUN 12, creatinine 0.91, normal LFTs, A1c 5.5, TC 211, TG 111, HDL 50, LDL 138 09/27/2022-Hgb 14.5, HCT 42.5, platelets 264  Objective   Past Medical History:  Diagnosis Date   ASTHMA, UNSPECIFIED, UNSPECIFIED STATUS    Closed fracture of  right distal fibula 05/09/2017   DEGENERATIVE JOINT DISEASE    Family history of adverse reaction to anesthesia    ponv   Hyperlipidemia    Hypertension    NECK PAIN 11/05/2009   OSTEOPENIA    POSITIVE PPD    SLEEP DISORDER, HX OF    UNSPECIFIED PAROXYSMAL TACHYCARDIA     Current Medications: Current Meds  Medication Sig   albuterol  (VENTOLIN  HFA) 108 (90 Base) MCG/ACT inhaler INHALE 2 PUFFS INTO THE LUNGS EVERY 6 HOURS AS NEEDED FOR WHEEZING OR SHORTNESS OF BREATH. (Patient taking differently: as needed. INHALE 2 PUFFS INTO THE LUNGS EVERY 6 HOURS AS NEEDED FOR WHEEZING OR SHORTNESS OF BREATH.)   alendronate  (FOSAMAX ) 70 MG tablet TAKE 1 TABLET BY MOUTH ONCE A WEEK. TAKE WITH A FULL GLASS OF WATER ON AN EMPTY STOMACH   budesonide -formoterol  (SYMBICORT ) 80-4.5 MCG/ACT inhaler Inhale 2 puffs into the lungs 2 (two) times daily as needed.   clonazePAM  (KLONOPIN ) 1 MG tablet TAKE 2 TABLETS BY MOUTH AT BEDTIME FOR SLEEP   Fluticasone -Umeclidin-Vilant (TRELEGY ELLIPTA ) 100-62.5-25 MCG/ACT AEPB Inhale 1 puff into the lungs daily.   Nebivolol  HCl 20 MG TABS Take 1/2 (one-half) tablet by mouth once daily   pantoprazole  (PROTONIX ) 40 MG tablet Take 1 tablet (40 mg total) by mouth daily as needed.   phenazopyridine  (PYRIDIUM ) 200 MG tablet Take 1 tablet (200 mg total) by mouth 2 (two) times daily as needed.   triamcinolone (KENALOG) 0.1 %  Apply 1 application topically as needed.    Allergies:   Codeine , Compazine, Lunesta  [eszopiclone ], Merthiolate [thimerosal (thiomersal)], Nickel, and Prochlorperazine edisylate   Social History   Socioeconomic History   Marital status: Married    Spouse name: Not on file   Number of children: 3   Years of education: Not on file   Highest education level: Not on file  Occupational History   Occupation: PATIENT CARE COORDIN    Employer: [employer]  Tobacco Use   Smoking status: Former    Current packs/day: 0.00    Types: Cigarettes    Quit  date: 1992    Years since quitting: 33.4   Smokeless tobacco: Never  Vaping Use   Vaping status: Never Used  Substance and Sexual Activity   Alcohol use: No   Drug use: No   Sexual activity: Not Currently  Other Topics Concern   Not on file  Social History Narrative   Not on file   Social Drivers of Health   Financial Resource Strain: Low Risk  (11/18/2022)   Overall Financial Resource Strain (CARDIA)    Difficulty of Paying Living Expenses: Not hard at all  Food Insecurity: No Food Insecurity (11/18/2022)   Hunger Vital Sign    Worried About Running Out of Food in the Last Year: Never true    Ran Out of Food in the Last Year: Never true  Transportation Needs: No Transportation Needs (11/18/2022)   PRAPARE - Administrator, Civil Service (Medical): No    Lack of Transportation (Non-Medical): No  Physical Activity: Sufficiently Active (11/18/2022)   Exercise Vital Sign    Days of Exercise per Week: 7 days    Minutes of Exercise per Session: 40 min  Stress: No Stress Concern Present (11/18/2022)   [employer] of Occupational Health - Occupational Stress Questionnaire    Feeling of Stress : Not at all  Social Connections: Socially Integrated (11/18/2022)   Social Connection and Isolation Panel    Frequency of Communication with Friends and Family: More than three times a week    Frequency of Social Gatherings with Friends and Family: More than three times a week    Attends Religious Services: More than 4 times per year    Active Member of [employer] or Organizations: Yes    Attends Engineer, structural: More than 4 times per year    Marital Status: Married     Family History:  The patient's family history includes Atrial fibrillation in her maternal grandmother and mother; Diabetes in her father; Heart attack in her father; Heart attack (age of onset: 26) in her brother; Heart disease (age of onset: 61) in her daughter; Heart failure in her father, maternal  grandmother, and mother; Hypertension in her father and maternal grandmother; Pancreatitis in her daughter; Prostate cancer in her maternal grandfather; Stroke in her mother. There is no history of Breast cancer.  ROS:   12-point review of systems is negative unless otherwise noted in the HPI.   EKGs/Other Studies Reviewed:    Studies reviewed were summarized above. The additional studies were reviewed today:  Event monitor (05/06/2021):  Predominantly sinus rhythm with rare PACs and PVCs. Brief episodes of PSVT and NSVT noted, lasting up to 10 beats and 7 beats, respectively.  TTE (05/06/2021):  Normal LV size and wall thickness.  LVEF 55-60% with grade 1 diastolic dysfunction.  Normal RV size and function.  Normal biatrial size.  Trivial mitral regurgitation.  No significant  valvular abnormalities.  Coronary CTA (04/12/2021):  Normal study without CAD.  CAC 0.  No significant extracardiac findings.  EKG:  EKG personally reviewed by me today EKG Interpretation Date/Time:  Tuesday November 07 2023 13:49:05 EDT Ventricular Rate:  68 PR Interval:  164 QRS Duration:  82 QT Interval:  390 QTC Calculation: 414 R Axis:   29  Text Interpretation: Normal sinus rhythm Normal ECG When compared with ECG of 08-May-2017 07:16, Nonspecific T wave abnormality has replaced inverted T waves in Anterior leads Confirmed by Gildardo Labrador ([id number]) on 11/07/2023 2:00:41 PM  PHYSICAL EXAM:    VS:  BP (!) 142/84   Pulse 68   Ht 5' 2 (1.575 m)   Wt 176 lb (79.8 kg)   SpO2 98%   BMI 32.19 kg/m   BMI: Body mass index is 32.19 kg/m.  Physical Exam Vitals and nursing note reviewed.  Constitutional:      General: She is not in acute distress.    Appearance: Normal appearance.   Cardiovascular:     Rate and Rhythm: Normal rate and regular rhythm.     Heart sounds: No murmur heard. Pulmonary:     Effort: Pulmonary effort is normal. No respiratory distress.     Breath sounds: No wheezing or rales.    Musculoskeletal:     Right lower leg: No edema.     Left lower leg: No edema.   Skin:    General: Skin is warm and dry.   Neurological:     General: No focal deficit present.     Mental Status: She is alert and oriented to person, place, and time. Mental status is at baseline.   Psychiatric:        Mood and Affect: Mood normal.        Behavior: Behavior normal.     Wt Readings from Last 3 Encounters:  11/07/23 176 lb (79.8 kg)  03/27/23 186 lb (84.4 kg)  11/18/22 176 lb (79.8 kg)         ASSESSMENT & PLAN:   Chest pain - No further episodes of chest pain. Coronary CTA done 03/2021 without CAD. No further workup indicated at this time.   PSVT and NSVT - Patient is asymptomatic at this time. Event monitor 05/2021 showed brief runs. She is continued on nebivolol .   Hypertension - BP mildly elevated today. No medication changes recommended today. Discussed lifestyle modifications including diet and exercise. Ongoing management per PCP who she is scheduled to see next month.     Disposition: F/u with Dr. Nolan Battle or an APP in 1 year.   Medication Adjustments/Labs and Tests Ordered: Current medicines are reviewed at length with the patient today.  Concerns regarding medicines are outlined above. Medication changes, Labs and Tests ordered today are summarized above and listed in the Patient Instructions accessible in Encounters.   Beather Liming, PA-C 11/07/2023 4:13 PM     [facility] [place] [address] [place], [place] [address] [phone]

## 2023-11-07 NOTE — Patient Instructions (Signed)
 Medication Instructions:  Your physician recommends that you continue on your current medications as directed. Please refer to the Current Medication list given to you today.   *If you need a refill on your cardiac medications before your next appointment, please call your pharmacy*  Lab Work: No labs ordered today  If you have labs (blood work) drawn today and your tests are completely normal, you will receive your results only by: MyChart Message (if you have MyChart) OR A paper copy in the mail If you have any lab test that is abnormal or we need to change your treatment, we will call you to review the results.  Testing/Procedures: No test ordered today   Follow-Up: At [facility], you and your health needs are our priority.  As part of our continuing mission to provide you with exceptional heart care, our providers are all part of one team.  This team includes your primary Cardiologist (physician) and Advanced Practice Providers or APPs (Physician Assistants and Nurse Practitioners) who all work together to provide you with the care you need, when you need it.  Your next appointment:   12 month(s)  Provider:   Sammy Crisp, MD or Gildardo Labrador, PA-C

## 2023-11-16 ENCOUNTER — Encounter: Payer: Self-pay | Admitting: Internal Medicine

## 2023-11-16 ENCOUNTER — Other Ambulatory Visit: Payer: Self-pay | Admitting: Internal Medicine

## 2023-11-16 ENCOUNTER — Encounter: Payer: Self-pay | Admitting: *Deleted

## 2023-11-16 NOTE — Patient Instructions (Addendum)
      Blood work was ordered.       Medications changes include :   start telmisartan 20 mg daily for your BP       Return in about 6 months (around 05/18/2024) for Physical Exam, Schedule DEXA-[place].

## 2023-11-16 NOTE — Progress Notes (Signed)
 Subjective:    Patient ID: Carrie Dalton, female    DOB: Jul 01, 1955, 68 y.o.   MRN: [id number]     HPI Edith is here for follow up of her chronic medical problems.  BP was elevated recently at cardiology and she has been checking it at home.  At home BP has been elevated 154/87, 147/82, 147/84, 124/72  Doing intermittent fasting  Medications and allergies reviewed with patient and updated if appropriate.  Current Outpatient Medications on File Prior to Visit  Medication Sig Dispense Refill   albuterol  (VENTOLIN  HFA) 108 (90 Base) MCG/ACT inhaler INHALE 2 PUFFS INTO THE LUNGS EVERY 6 HOURS AS NEEDED FOR WHEEZING OR SHORTNESS OF BREATH. (Patient taking differently: as needed. INHALE 2 PUFFS INTO THE LUNGS EVERY 6 HOURS AS NEEDED FOR WHEEZING OR SHORTNESS OF BREATH.) 18 g 12   alendronate  (FOSAMAX ) 70 MG tablet TAKE 1 TABLET BY MOUTH ONCE A WEEK. TAKE WITH A FULL GLASS OF WATER ON AN EMPTY STOMACH 12 tablet 0   aspirin 81 MG tablet Take 81 mg by mouth daily.     budesonide -formoterol  (SYMBICORT ) 80-4.5 MCG/ACT inhaler Inhale 2 puffs into the lungs 2 (two) times daily as needed.     cholecalciferol (VITAMIN D ) 1000 UNITS tablet Take 1,000 Units by mouth daily.     clonazePAM  (KLONOPIN ) 1 MG tablet TAKE 2 TABLETS BY MOUTH AT BEDTIME FOR SLEEP 60 tablet 5   Fluticasone -Umeclidin-Vilant (TRELEGY ELLIPTA ) 100-62.5-25 MCG/ACT AEPB Inhale 1 puff into the lungs daily. 14 each 0   Nebivolol  HCl 20 MG TABS Take 1/2 (one-half) tablet by mouth once daily 45 tablet 0   pantoprazole  (PROTONIX ) 40 MG tablet Take 1 tablet (40 mg total) by mouth daily as needed. 90 tablet 1   phenazopyridine  (PYRIDIUM ) 200 MG tablet Take 1 tablet (200 mg total) by mouth 2 (two) times daily as needed. 60 tablet 11   triamcinolone (KENALOG) 0.1 % Apply 1 application topically as needed.     No current facility-administered medications on file prior to visit.     Review of Systems  Constitutional:  Negative for  fever.  Respiratory:  Positive for shortness of breath (wtih moderate exertion). Negative for cough and wheezing.   Cardiovascular:  Negative for chest pain, palpitations and leg swelling.  Neurological:  Positive for headaches (few headaches). Negative for dizziness and light-headedness.       Objective:   Vitals:   11/17/23 1044  BP: (!) 140/80  Pulse: 84  Temp: 98.2 F (36.8 C)  SpO2: 98%   BP Readings from Last 3 Encounters:  11/17/23 (!) 140/80  11/07/23 (!) 142/84  03/27/23 126/74   Wt Readings from Last 3 Encounters:  11/17/23 176 lb (79.8 kg)  11/07/23 176 lb (79.8 kg)  03/27/23 186 lb (84.4 kg)   Body mass index is 32.19 kg/m.    Physical Exam Constitutional:      General: She is not in acute distress.    Appearance: Normal appearance.  HENT:     Head: Normocephalic and atraumatic.   Eyes:     Conjunctiva/sclera: Conjunctivae normal.    Cardiovascular:     Rate and Rhythm: Normal rate and regular rhythm.     Heart sounds: Normal heart sounds.  Pulmonary:     Effort: Pulmonary effort is normal. No respiratory distress.     Breath sounds: Normal breath sounds. No wheezing.   Musculoskeletal:     Cervical back: Neck supple.  Right lower leg: No edema.     Left lower leg: No edema.  Lymphadenopathy:     Cervical: No cervical adenopathy.   Skin:    General: Skin is warm and dry.     Findings: No rash.   Neurological:     Mental Status: She is alert. Mental status is at baseline.   Psychiatric:        Mood and Affect: Mood normal.        Behavior: Behavior normal.        Lab Results  Component Value Date   WBC 7.1 09/27/2022   HGB 14.5 09/27/2022   HCT 42.5 09/27/2022   PLT 264.0 09/27/2022   GLUCOSE 106 (H) 03/27/2023   CHOL 211 (H) 03/27/2023   TRIG 111.0 03/27/2023   HDL 50.50 03/27/2023   LDLDIRECT 161.1 03/13/2012   LDLCALC 138 (H) 03/27/2023   ALT 10 03/27/2023   AST 14 03/27/2023   NA 140 03/27/2023   K 4.9 03/27/2023    CL 105 03/27/2023   CREATININE 0.91 03/27/2023   BUN 12 03/27/2023   CO2 29 03/27/2023   TSH 1.43 09/27/2022   PSA WNL 03/24/2014   HGBA1C 5.5 03/27/2023     Assessment & Plan:    See Problem List for Assessment and Plan of chronic medical problems.

## 2023-11-17 ENCOUNTER — Ambulatory Visit (INDEPENDENT_AMBULATORY_CARE_PROVIDER_SITE_OTHER): Admitting: Internal Medicine

## 2023-11-17 ENCOUNTER — Other Ambulatory Visit

## 2023-11-17 ENCOUNTER — Telehealth: Payer: Self-pay

## 2023-11-17 ENCOUNTER — Telehealth: Payer: Self-pay | Admitting: Internal Medicine

## 2023-11-17 VITALS — BP 140/80 | HR 84 | Temp 98.2°F | Ht 62.0 in | Wt 176.0 lb

## 2023-11-17 DIAGNOSIS — R7303 Prediabetes: Secondary | ICD-10-CM

## 2023-11-17 DIAGNOSIS — F5101 Primary insomnia: Secondary | ICD-10-CM

## 2023-11-17 DIAGNOSIS — K219 Gastro-esophageal reflux disease without esophagitis: Secondary | ICD-10-CM | POA: Diagnosis not present

## 2023-11-17 DIAGNOSIS — M810 Age-related osteoporosis without current pathological fracture: Secondary | ICD-10-CM

## 2023-11-17 DIAGNOSIS — E559 Vitamin D deficiency, unspecified: Secondary | ICD-10-CM | POA: Diagnosis not present

## 2023-11-17 DIAGNOSIS — I10 Essential (primary) hypertension: Secondary | ICD-10-CM

## 2023-11-17 DIAGNOSIS — E2839 Other primary ovarian failure: Secondary | ICD-10-CM

## 2023-11-17 DIAGNOSIS — E7849 Other hyperlipidemia: Secondary | ICD-10-CM

## 2023-11-17 LAB — CBC WITH DIFFERENTIAL/PLATELET
Basophils Absolute: 0 10*3/uL (ref 0.0–0.1)
Basophils Relative: 0.6 % (ref 0.0–3.0)
Eosinophils Absolute: 0.6 10*3/uL (ref 0.0–0.7)
Eosinophils Relative: 8.1 % — ABNORMAL HIGH (ref 0.0–5.0)
HCT: 42.1 % (ref 36.0–46.0)
Hemoglobin: 14.3 g/dL (ref 12.0–15.0)
Lymphocytes Relative: 18.5 % (ref 12.0–46.0)
Lymphs Abs: 1.4 10*3/uL (ref 0.7–4.0)
MCHC: 33.8 g/dL (ref 30.0–36.0)
MCV: 87 fl (ref 78.0–100.0)
Monocytes Absolute: 0.5 10*3/uL (ref 0.1–1.0)
Monocytes Relative: 6.9 % (ref 3.0–12.0)
Neutro Abs: 4.9 10*3/uL (ref 1.4–7.7)
Neutrophils Relative %: 65.9 % (ref 43.0–77.0)
Platelets: 273 10*3/uL (ref 150.0–400.0)
RBC: 4.84 Mil/uL (ref 3.87–5.11)
RDW: 13.4 % (ref 11.5–15.5)
WBC: 7.4 10*3/uL (ref 4.0–10.5)

## 2023-11-17 LAB — LIPID PANEL
Cholesterol: 206 mg/dL — ABNORMAL HIGH (ref 0–200)
HDL: 42.6 mg/dL (ref >39.00)
LDL Cholesterol: 142 mg/dL — ABNORMAL HIGH (ref 0–99)
NonHDL: 163.13
Total CHOL/HDL Ratio: 5
Triglycerides: 106 mg/dL (ref 0.0–149.0)
VLDL: 21.2 mg/dL (ref 0.0–40.0)

## 2023-11-17 LAB — COMPREHENSIVE METABOLIC PANEL WITH GFR
ALT: 11 U/L (ref 0–35)
AST: 15 U/L (ref 0–37)
Albumin: 4.5 g/dL (ref 3.5–5.2)
Alkaline Phosphatase: 50 U/L (ref 39–117)
BUN: 8 mg/dL (ref 6–23)
CO2: 27 meq/L (ref 19–32)
Calcium: 9.4 mg/dL (ref 8.4–10.5)
Chloride: 107 meq/L (ref 96–112)
Creatinine, Ser: 0.74 mg/dL (ref 0.40–1.20)
GFR: 82.88 mL/min (ref >60.00)
Glucose, Bld: 119 mg/dL — ABNORMAL HIGH (ref 70–99)
Potassium: 4.6 meq/L (ref 3.5–5.1)
Sodium: 140 meq/L (ref 135–145)
Total Bilirubin: 0.5 mg/dL (ref 0.2–1.2)
Total Protein: 7.1 g/dL (ref 6.0–8.3)

## 2023-11-17 LAB — HEMOGLOBIN A1C: Hgb A1c MFr Bld: 5.6 % (ref 4.6–6.5)

## 2023-11-17 LAB — VITAMIN D 25 HYDROXY (VIT D DEFICIENCY, FRACTURES): VITD: 33.2 ng/mL (ref 30.00–100.00)

## 2023-11-17 MED ORDER — TELMISARTAN 20 MG PO TABS: 20.0000 mg | ORAL_TABLET | Freq: Every day | ORAL | 1 refills | Status: DC

## 2023-11-17 MED ORDER — CLONAZEPAM 1 MG PO TABS: ORAL_TABLET | ORAL | 5 refills | Status: DC

## 2023-11-17 NOTE — Assessment & Plan Note (Addendum)
 Chronic Blood pressure not ideally controlled CMP, CBC Continue nebivolol  10 mg daily Start telmisartan 20 mg daily Advised to keep monitoring BP at home

## 2023-11-17 NOTE — Telephone Encounter (Signed)
 Pt is requesting refill on Klonopin  1mg . Pt last seen 05/2023 with no pending appt.   CY, please advise. Thanks

## 2023-11-17 NOTE — Assessment & Plan Note (Addendum)
 Chronic DEXA due-ordered Taking fosamax  70 mg weekly - started 09/2022-plan to continue for 5 years Taking calcium  and vitamin D  Check vitamin D  level Stressed regular exercise-weightbearing exercise, resistance training

## 2023-11-17 NOTE — Assessment & Plan Note (Signed)
Chronic Taking clonazepam nightly

## 2023-11-17 NOTE — Assessment & Plan Note (Signed)
Chronic GERD controlled Continue pantoprazole 40 mg daily prn  

## 2023-11-17 NOTE — Telephone Encounter (Signed)
 Clonazepam  refilled [facility], [place]. She has office visit scheduled in August

## 2023-11-17 NOTE — Assessment & Plan Note (Signed)
 Chronic Lab Results  Component Value Date   HGBA1C 5.5 03/27/2023   Check a1c Low sugar / carb diet Stressed regular exercise

## 2023-11-17 NOTE — Assessment & Plan Note (Signed)
 Chronic Taking vitamin D daily Check vitamin D level

## 2023-11-17 NOTE — Assessment & Plan Note (Signed)
 Chronic Check lipid panel Stressed regular exercise, healthy diet Coronary artery calcium score is 0 Elevated ASCVD risk Prefers to not take any medication Continue lifestyle control

## 2023-11-18 ENCOUNTER — Ambulatory Visit: Payer: Self-pay | Admitting: Internal Medicine

## 2023-11-21 ENCOUNTER — Ambulatory Visit (INDEPENDENT_AMBULATORY_CARE_PROVIDER_SITE_OTHER)

## 2023-11-21 VITALS — Ht 62.0 in

## 2023-11-21 DIAGNOSIS — Z0000 Encounter for general adult medical examination without abnormal findings: Secondary | ICD-10-CM | POA: Diagnosis not present

## 2023-11-21 NOTE — Progress Notes (Signed)
 Subjective:   Carrie Dalton is a 68 y.o. who presents for a Medicare Wellness preventive visit.  As a reminder, Annual Wellness Visits don't include a physical exam, and some assessments may be limited, especially if this visit is performed virtually. We may recommend an in-person follow-up visit with your provider if needed.  Visit Complete: Virtual I connected with  Carrie Dalton on 11/21/23 by a audio enabled telemedicine application and verified that I am speaking with the correct person using two identifiers.  Patient Location: Home  Provider Location: Home Office  I discussed the limitations of evaluation and management by telemedicine. The patient expressed understanding and agreed to proceed.  Vital Signs: Because this visit was a virtual/telehealth visit, some criteria may be missing or patient reported. Any vitals not documented were not able to be obtained and vitals that have been documented are patient reported.  VideoDeclined- This patient declined Librarian, academic. Therefore the visit was completed with audio only.  Persons Participating in Visit: Patient.  AWV Questionnaire: No: Patient Medicare AWV questionnaire was not completed prior to this visit.  Cardiac Risk Factors include: advanced age (>71men, >86 women);dyslipidemia;hypertension;obesity (BMI >30kg/m2)     Objective:    Today's Vitals   11/21/23 0848  Height: 5' 2 (1.575 m)   Body mass index is 32.19 kg/m.     11/21/2023    9:27 AM 11/18/2022   10:52 AM 11/03/2021   12:07 PM 08/29/2017    3:13 PM 05/09/2017    8:42 AM 05/04/2017   11:14 AM  Advanced Directives  Does Patient Have a Medical Advance Directive? No No No No  No  No   Would patient like information on creating a medical advance directive?  No - Patient declined No - Patient declined No - Patient declined  No - Patient declined       Data saved with a previous flowsheet row definition    Current  Medications (verified) Outpatient Encounter Medications as of 11/21/2023  Medication Sig   albuterol  (VENTOLIN  HFA) 108 (90 Base) MCG/ACT inhaler INHALE 2 PUFFS INTO THE LUNGS EVERY 6 HOURS AS NEEDED FOR WHEEZING OR SHORTNESS OF BREATH. (Patient taking differently: as needed. INHALE 2 PUFFS INTO THE LUNGS EVERY 6 HOURS AS NEEDED FOR WHEEZING OR SHORTNESS OF BREATH.)   alendronate  (FOSAMAX ) 70 MG tablet TAKE 1 TABLET BY MOUTH ONCE A WEEK. TAKE WITH A FULL GLASS OF WATER ON AN EMPTY STOMACH   aspirin 81 MG tablet Take 81 mg by mouth daily.   budesonide -formoterol  (SYMBICORT ) 80-4.5 MCG/ACT inhaler Inhale 2 puffs into the lungs 2 (two) times daily as needed.   cholecalciferol (VITAMIN D ) 1000 UNITS tablet Take 1,000 Units by mouth daily.   clonazePAM  (KLONOPIN ) 1 MG tablet TAKE 2 TABLETS BY MOUTH AT BEDTIME FOR SLEEP   Fluticasone -Umeclidin-Vilant (TRELEGY ELLIPTA ) 100-62.5-25 MCG/ACT AEPB Inhale 1 puff into the lungs daily.   Nebivolol  HCl 20 MG TABS Take 1/2 (one-half) tablet by mouth once daily   pantoprazole  (PROTONIX ) 40 MG tablet Take 1 tablet (40 mg total) by mouth daily as needed.   phenazopyridine  (PYRIDIUM ) 200 MG tablet Take 1 tablet (200 mg total) by mouth 2 (two) times daily as needed.   telmisartan (MICARDIS) 20 MG tablet Take 1 tablet (20 mg total) by mouth daily.   triamcinolone (KENALOG) 0.1 % Apply 1 application topically as needed.   No facility-administered encounter medications on file as of 11/21/2023.    Allergies (verified) Codeine , Compazine, Lunesta  [  eszopiclone ], Merthiolate [thimerosal (thiomersal)], Nickel, and Prochlorperazine edisylate   History: Past Medical History:  Diagnosis Date   ASTHMA, UNSPECIFIED, UNSPECIFIED STATUS    Closed fracture of right distal fibula 05/09/2017   DEGENERATIVE JOINT DISEASE    Family history of adverse reaction to anesthesia    ponv   Hyperlipidemia    Hypertension    NECK PAIN 11/05/2009   OSTEOPENIA    POSITIVE PPD    SLEEP  DISORDER, HX OF    UNSPECIFIED PAROXYSMAL TACHYCARDIA    Past Surgical History:  Procedure Laterality Date   CESAREAN SECTION  1984   DILATION AND CURETTAGE OF UTERUS     x 3   ORIF FIBULA FRACTURE Right 05/09/2017   Procedure: OPEN REDUCTION INTERNAL FIXATION (ORIF) RIGHT DISTAL FIBULA FRACTURE;  Surgeon: Josefina Chew, MD;  Location: [facility];  Service: Orthopedics;  Laterality: Right;   Family History  Problem Relation Age of Onset   Heart failure Mother    Stroke Mother    Atrial fibrillation Mother    Heart attack Father    Heart failure Father    Diabetes Father    Hypertension Father    Pancreatitis Daughter    Heart disease Daughter 12       Restrictive cardiomyopathy status post heart transplant   Atrial fibrillation Maternal Grandmother    Heart failure Maternal Grandmother        CHF   Hypertension Maternal Grandmother    Prostate cancer Maternal Grandfather    Heart attack Brother 39       half-brother   Breast cancer Neg Hx    Social History   Socioeconomic History   Marital status: Married    Spouse name: Darryle   Number of children: 3   Years of education: Not on file   Highest education level: Not on file  Occupational History   Occupation: RETIRED/PATIENT CARE COORDIN    Employer: [employer]  Tobacco Use   Smoking status: Former    Current packs/day: 0.00    Types: Cigarettes    Quit date: 1992    Years since quitting: 33.5   Smokeless tobacco: Never  Vaping Use   Vaping status: Never Used  Substance and Sexual Activity   Alcohol use: No   Drug use: No   Sexual activity: Not Currently  Other Topics Concern   Not on file  Social History Narrative   Lives with husband/2025   Social Drivers of Health   Financial Resource Strain: Low Risk  (11/21/2023)   Overall Financial Resource Strain (CARDIA)    Difficulty of Paying Living Expenses: Not hard at all  Food Insecurity: No Food Insecurity (11/21/2023)   Hunger  Vital Sign    Worried About Running Out of Food in the Last Year: Never true    Ran Out of Food in the Last Year: Never true  Transportation Needs: No Transportation Needs (11/21/2023)   PRAPARE - Administrator, Civil Service (Medical): No    Lack of Transportation (Non-Medical): No  Physical Activity: Inactive (11/21/2023)   Exercise Vital Sign    Days of Exercise per Week: 0 days    Minutes of Exercise per Session: 0 min  Stress: No Stress Concern Present (11/21/2023)   [employer] of Occupational Health - Occupational Stress Questionnaire    Feeling of Stress: Not at all  Social Connections: Socially Integrated (11/21/2023)   Social Connection and Isolation Panel    Frequency of Communication with Friends  and Family: More than three times a week    Frequency of Social Gatherings with Friends and Family: Twice a week    Attends Religious Services: More than 4 times per year    Active Member of [employer] or Organizations: Yes    Attends Engineer, structural: More than 4 times per year    Marital Status: Married    Tobacco Counseling Counseling given: Not Answered    Clinical Intake:     Pain : No/denies pain     BMI - recorded: 32.19 Nutritional Status: BMI > 30  Obese Nutritional Risks: None Diabetes: No  Lab Results  Component Value Date   HGBA1C 5.6 11/17/2023   HGBA1C 5.5 03/27/2023   HGBA1C 5.5 09/27/2022     How often do you need to have someone help you when you read instructions, pamphlets, or other written materials from your doctor or pharmacy?: 1 - Never  Interpreter Needed?: No  Information entered by :: Demi Trieu, RMA   Activities of Daily Living     11/21/2023    9:25 AM  In your present state of health, do you have any difficulty performing the following activities:  Hearing? 0  Vision? 0  Difficulty concentrating or making decisions? 0  Walking or climbing stairs? 0  Dressing or bathing? 0  Doing errands, shopping? 0   Preparing Food and eating ? N  Using the Toilet? N  In the past six months, have you accidently leaked urine? N  Do you have problems with loss of bowel control? N  Managing your Medications? N  Managing your Finances? N  Housekeeping or managing your Housekeeping? N    Patient Care Team: Geofm Glade PARAS, MD as PCP - General (Internal Medicine) End, Lonni, MD as PCP - Cardiology (Cardiology) Teressa Toribio SQUIBB, MD (Inactive) (Gastroenterology) Neysa Reggy BIRCH, MD (Pulmonary Disease) Estelle Service, MD (Obstetrics and Gynecology)  I have updated your Care Teams any recent Medical Services you may have received from other providers in the past year.     Assessment:   This is a routine wellness examination for Laughlin.  Hearing/Vision screen Hearing Screening - Comments:: Denies hearing difficulties   Vision Screening - Comments:: Wears eyeglasses/ no eye doctor   Goals Addressed               This Visit's Progress     Patient Stated (pt-stated)   On track     Patient states her goals is to stay active, increase ambulating        Depression Screen     11/21/2023    9:31 AM 11/18/2022   10:57 AM 11/18/2022   10:52 AM 09/27/2022   10:28 AM 03/28/2022   10:58 AM 11/03/2021   12:09 PM 08/30/2021   11:21 AM  PHQ 2/9 Scores  PHQ - 2 Score 0 0 0 0 0 0 0  PHQ- 9 Score 0   0 0      Fall Risk     11/21/2023    9:27 AM 11/18/2022   10:52 AM 09/27/2022   10:28 AM 03/28/2022   10:58 AM 11/03/2021   12:04 PM  Fall Risk   Falls in the past year? 0 0 0 0 0  Number falls in past yr: 0 0 0 0 0  Injury with Fall? 0 0 0 0 0  Risk for fall due to :  No Fall Risks No Fall Risks No Fall Risks   Follow up Falls evaluation  completed;Falls prevention discussed Falls evaluation completed Falls evaluation completed Falls evaluation completed  Education provided;Falls prevention discussed;Falls evaluation completed      Data saved with a previous flowsheet row definition    MEDICARE  RISK AT HOME:  Medicare Risk at Home Any stairs in or around the home?: Yes (2 story home) If so, are there any without handrails?: No Home free of loose throw rugs in walkways, pet beds, electrical cords, etc?: Yes Adequate lighting in your home to reduce risk of falls?: Yes Life alert?: No Use of a cane, walker or w/c?: No Grab bars in the bathroom?: No Shower chair or bench in shower?: Yes Elevated toilet seat or a handicapped toilet?: No  TIMED UP AND GO:  Was the test performed?  No  Cognitive Function: Declined/Normal: No cognitive concerns noted by patient or family. Patient alert, oriented, able to answer questions appropriately and recall recent events. No signs of memory loss or confusion.        11/18/2022   11:04 AM 11/03/2021   12:05 PM  6CIT Screen  What Year? 0 points 0 points  What month? 0 points 0 points  What time? 0 points 0 points  Count back from 20 0 points 0 points  Months in reverse 0 points 0 points  Repeat phrase 0 points 0 points  Total Score 0 points 0 points    Immunizations Immunization History  Administered Date(s) Administered   Influenza Split 02/21/2012   Influenza,inj,Quad PF,6+ Mos 02/20/2013   Influenza-Unspecified 02/17/2015, 02/16/2016, 02/22/2017   PFIZER(Purple Top)SARS-COV-2 Vaccination 06/04/2019, 06/25/2019   PNEUMOCOCCAL CONJUGATE-20 07/27/2020   Pneumococcal Polysaccharide-23 11/05/2013   Td 08/28/2008   Tdap 01/11/2016   Zoster Recombinant(Shingrix) 09/14/2017, 07/26/2019   Zoster, Live 02/17/2015    Screening Tests Health Maintenance  Topic Date Due   Colonoscopy  Never done   MAMMOGRAM  12/17/2021   DEXA SCAN  08/06/2022   COVID-19 Vaccine (3 - 2024-25 season) 12/02/2023 (Originally 01/22/2023)   INFLUENZA VACCINE  12/22/2023   Medicare Annual Wellness (AWV)  11/20/2024   DTaP/Tdap/Td (3 - Td or Tdap) 01/10/2026   Pneumococcal Vaccine: 50+ Years  Completed   Hepatitis C Screening  Completed   Zoster Vaccines-  Shingrix  Completed   Hepatitis B Vaccines  Aged Out   HPV VACCINES  Aged Out   Meningococcal B Vaccine  Aged Out    Health Maintenance  Health Maintenance Due  Topic Date Due   Colonoscopy  Never done   MAMMOGRAM  12/17/2021   DEXA SCAN  08/06/2022   Health Maintenance Items Addressed: See Nurse Notes at the end of this note  Additional Screening:  Vision Screening: Recommended annual ophthalmology exams for early detection of glaucoma and other disorders of the eye. Would you like a referral to an eye doctor? Yes    Dental Screening: Recommended annual dental exams for proper oral hygiene  Community Resource Referral / Chronic Care Management: CRR required this visit?  No   CCM required this visit?  No   Plan:    I have personally reviewed and noted the following in the patient's chart:   Medical and social history Use of alcohol, tobacco or illicit drugs  Current medications and supplements including opioid prescriptions. Patient is not currently taking opioid prescriptions. Functional ability and status Nutritional status Physical activity Advanced directives List of other physicians Hospitalizations, surgeries, and ER visits in previous 12 months Vitals Screenings to include cognitive, depression, and falls Referrals and appointments  In  addition, I have reviewed and discussed with patient certain preventive protocols, quality metrics, and best practice recommendations. A written personalized care plan for preventive services as well as general preventive health recommendations were provided to patient.   Shantana Christon L Monserrat Vidaurri, CMA   11/21/2023   After Visit Summary: (MyChart) Due to this being a telephonic visit, the after visit summary with patients personalized plan was offered to patient via MyChart   Notes: Patient is due for a DEXA and order has been placed.  Patient stated that she has to reschedule that density screening.  She also stated that she has had  her mammogram last month, which I have sent a request for today.  Patient declines a colonoscopy for now.  She also declines the Flu vaccine and Covid.  Patient had no other concerns to address today.

## 2023-11-21 NOTE — Patient Instructions (Signed)
 Carrie Dalton , Thank you for taking time out of your busy schedule to complete your Annual Wellness Visit with me. I enjoyed our conversation and look forward to speaking with you again next year. I, as well as your care team,  appreciate your ongoing commitment to your health goals. Please review the following plan we discussed and let me know if I can assist you in the future. Your Game plan/ To Do List   Follow up Visits: Next Medicare AWV with our clinical staff: 11/21/2024.   Have you seen your provider in the last 6 months (3 months if uncontrolled diabetes)? Yes Next Office Visit with your provider: Patient stated that she will call to schedule her next office visit.  Her last visit was on 11/17/2023.  Clinician Recommendations:  Aim for 30 minutes of exercise or brisk walking, 6-8 glasses of water, and 5 servings of fruits and vegetables each day. Remember to get scheduled for your Bone density soon.      This is a list of the screening recommended for you and due dates:  Health Maintenance  Topic Date Due   Colon Cancer Screening  Never done   Mammogram  12/17/2021   DEXA scan (bone density measurement)  08/06/2022   COVID-19 Vaccine (3 - 2024-25 season) 12/02/2023*   Flu Shot  12/22/2023   Medicare Annual Wellness Visit  11/20/2024   DTaP/Tdap/Td vaccine (3 - Td or Tdap) 01/10/2026   Pneumococcal Vaccine for age over 67  Completed   Hepatitis C Screening  Completed   Zoster (Shingles) Vaccine  Completed   Hepatitis B Vaccine  Aged Out   HPV Vaccine  Aged Out   Meningitis B Vaccine  Aged Out  *Topic was postponed. The date shown is not the original due date.    Advanced directives: (Declined) Advance directive discussed with you today. Even though you declined this today, please call our office should you change your mind, and we can give you the proper paperwork for you to fill out. Advance Care Planning is important because it:  [x]  Makes sure you receive the medical care that  is consistent with your values, goals, and preferences  [x]  It provides guidance to your family and loved ones and reduces their decisional burden about whether or not they are making the right decisions based on your wishes.  Follow the link provided in your after visit summary or read over the paperwork we have mailed to you to help you started getting your Advance Directives in place. If you need assistance in completing these, please reach out to us  so that we can help you!  See attachments for Preventive Care and Fall Prevention Tips.

## 2023-11-23 ENCOUNTER — Telehealth: Payer: Self-pay | Admitting: Internal Medicine

## 2023-11-23 ENCOUNTER — Other Ambulatory Visit: Payer: Self-pay | Admitting: Internal Medicine

## 2023-11-23 DIAGNOSIS — I10 Essential (primary) hypertension: Secondary | ICD-10-CM

## 2023-11-23 MED ORDER — INDAPAMIDE 1.25 MG PO TABS: 1.2500 mg | ORAL_TABLET | Freq: Every day | ORAL | 0 refills | Status: DC

## 2023-11-23 NOTE — Telephone Encounter (Signed)
 Copied from CRM [id number]. Topic: Clinical - Medical Advice >> Nov 23, 2023 11:37 AM Carrie Dalton wrote: Reason for CRM: Patient was prescribed telmisartan (MICARDIS) 20 MG tablet but she's stating that there's still no difference in her blood pressure changing. She said that it fluctuates throughout the day. She is wondering what Dr. Geofm would like for her to do or if she needs to try a different medication.

## 2023-12-01 ENCOUNTER — Other Ambulatory Visit: Payer: Self-pay | Admitting: Internal Medicine

## 2023-12-15 ENCOUNTER — Other Ambulatory Visit: Payer: Self-pay | Admitting: Internal Medicine

## 2024-01-14 NOTE — Progress Notes (Signed)
 @Patient  ID: Carrie Dalton, female    DOB: [birth year], 68 y.o.   MRN: [id number]  Chief Complaint  Patient presents with   Follow-up    Recent URI-- lingering dry cough. Completed course of zpak.     Referring provider: Geofm Glade JINNY, MD  HPI: 68 year old female, former smoker.  Past medical history significant for asthma, hypertension, GERD, lipidemia, vitamin D  deficiency, insomnia, anxiety, prediabetes.  Patient of Dr. Neysa  Previous [facility] pulmonary encounter: 02/22/21- 80 yoF former smoker followed for Chronic Insomnia, Asthma, complicated by PAT, +PPD, GERD, Anxiety/ Depression after loss of daughter,  -clonazepam  and triazolam  for sleep, Ventolin  hfa, Symbicort  80 Covid vax-2 Phizer Flu vax- -----Pt states Klonopin  is not working for her anymore. Haven't been able to sleep. No longer using triazolam . Hasn't needed inhalers in a long time because she is not exposed to smoke/ strong perfumes.     05/30/2022- Interim hx  Patient presents today for follow-up asthma. She has mild intermittent asthma, using inhalers as needed. Clonazepam  1.5mg  no longer effective for chronic insomnia. During last visit in October she was given trial Trazodone  50-150mg  for insomnia without improvement. Lunesta  caused nausea and high blood pressure. Given samples Dayvigo 5mg .   Currently taking clonazepam  2mg  at bedtime. That is working well for her. Typical bedtime is 11pm. It takes her 30 mins to fall asleep when taking medication. She starts her day between 7-8am. No residual daytime grogginess.   Her asthma symptoms are well controlled, She avoids triggers. No longer using Symbicort  regularly but keeps in on hand in case. She had an upper respiratory infection 2- 3 weeks ago, treated with azithromycin . She has a lingering dry cough. She took mucinex and delsym. Benzonate did not help.   Covid vax-2 Phizer (2021) Flu vax- Not up to date, declined     01/16/24- 68 year old female, former smoker.  Past  medical history significant for asthma, hypertension, GERD, lipidemia, vitamin D  deficiency, insomnia, anxiety, prediabetes.  Patient of Dr. Neysa Previous [facility] pulmonary encounter: 02/22/21- 56 yoF former smoker followed for Chronic Insomnia, Asthma, complicated by PAT, +PPD, GERD, Anxiety/ Depression after loss of daughter,  -clonazepam  and triazolam  for sleep, Ventolin  hfa, Symbicort  79 Discussed the use of AI scribe software for clinical note transcription with the patient, who gave verbal consent to proceed.  History of Present Illness   Carrie Dalton is a 68 year old female who presents for medication management and follow-up of Chronic Insomnia, complicated by anxiety..  She was recently started on a diuretic for elevated blood pressure, recorded at 158/xx mmHg, with no side effects or changes in her condition since starting the medication. She is also taking clonazepam  for anxiety management.     Meds discussed. These have worked well for her over long time, well tolerated without carryover or misuse. She will talk with her PCP about future refills. As I will be retiring.  Assessment and Plan:    Insomnia- primary Anxiety component -Clonazepam  has worked well and can be refilled   Hypertension Hypertension with recent elevated blood pressure reading. Managed with diuretic.     Review of Systems Review of Systems  Constitutional: Negative.   HENT: Negative.    Respiratory: Negative.    Cardiovascular: Negative.    OBJ- Physical Exam General- Alert, Oriented, Affect-appropriate, Distress- none acute, +overweight Skin- rash-none, lesions- none, excoriation- none Lymphadenopathy- none Head- atraumatic            Eyes- Gross vision intact, PERRLA, conjunctivae and  secretions clear            Ears- Hearing, canals-normal            Nose- Clear, no-Septal dev, mucus, polyps, erosion, perforation             Throat- Mallampati II , mucosa clear , drainage- none, tonsils-  atrophic Neck- flexible , trachea midline, no stridor , thyroid  nl, carotid no bruit Chest - symmetrical excursion , unlabored           Heart/CV- RRR , no murmur , no gallop  , no rub, nl s1 s2                           - JVD- none , edema- none, stasis changes- none, varices- none           Lung- clear to P&A, wheeze- none, cough- none , dullness-none, rub- none           Chest wall-  Abd-  Br/ Gen/ Rectal- Not done, not indicated Extrem- cyanosis- none, clubbing, none, atrophy- none, strength- nl Neuro- grossly intact to observation  Assessment & Plan:   Assessment and Plan:    Insomnia- primary Anxiety component -Clonazepam  has worked well and can be refilled   Hypertension Hypertension with recent elevated blood pressure reading. Managed with diuretic.

## 2024-01-16 ENCOUNTER — Encounter: Payer: Self-pay | Admitting: Internal Medicine

## 2024-01-16 ENCOUNTER — Ambulatory Visit (INDEPENDENT_AMBULATORY_CARE_PROVIDER_SITE_OTHER)
Admission: RE | Admit: 2024-01-16 | Discharge: 2024-01-16 | Disposition: A | Discharge status: Home / Self Care | Source: Ambulatory Visit | Attending: Internal Medicine | Admitting: Internal Medicine

## 2024-01-16 ENCOUNTER — Ambulatory Visit: Admitting: Internal Medicine

## 2024-01-16 VITALS — BP 136/74 | HR 75 | Temp 98.0°F | Ht 62.0 in | Wt 178.2 lb

## 2024-01-16 DIAGNOSIS — E2839 Other primary ovarian failure: Secondary | ICD-10-CM

## 2024-01-16 DIAGNOSIS — F5101 Primary insomnia: Secondary | ICD-10-CM

## 2024-01-16 NOTE — Patient Instructions (Signed)
 Ok to continue current meds  Please let us  know if we can help

## 2024-01-20 ENCOUNTER — Encounter: Payer: Self-pay | Admitting: Internal Medicine

## 2024-02-02 ENCOUNTER — Other Ambulatory Visit: Payer: Self-pay | Admitting: Internal Medicine

## 2024-02-12 ENCOUNTER — Other Ambulatory Visit: Payer: Self-pay | Admitting: Internal Medicine

## 2024-02-12 DIAGNOSIS — I10 Essential (primary) hypertension: Secondary | ICD-10-CM

## 2024-02-23 ENCOUNTER — Other Ambulatory Visit: Payer: Self-pay | Admitting: Internal Medicine

## 2024-04-30 ENCOUNTER — Encounter: Payer: Self-pay | Admitting: Internal Medicine

## 2024-04-30 NOTE — Patient Instructions (Addendum)
      Blood work was ordered.       Medications changes include :   None    A referral was ordered and someone will call you to schedule an appointment.     Return in about 6 months (around 10/30/2024) for follow up.

## 2024-04-30 NOTE — Progress Notes (Unsigned)
      Subjective:    Patient ID: Carrie Dalton, female    DOB: 1955/07/12, 68 y.o.   MRN: [id number]     HPI Carrie Dalton is here for follow up of her chronic medical problems.    Medications and allergies reviewed with patient and updated if appropriate.  Current Outpatient Medications on File Prior to Visit  Medication Sig Dispense Refill   albuterol  (VENTOLIN  HFA) 108 (90 Base) MCG/ACT inhaler INHALE 2 PUFFS INTO THE LUNGS EVERY 6 HOURS AS NEEDED FOR WHEEZING OR SHORTNESS OF BREATH. 18 g 12   alendronate  (FOSAMAX ) 70 MG tablet TAKE 1 TABLET BY MOUTH ONCE A WEEK TAKE  WITH  A  FULL  GLASS  OF  WATER  ON  AN  EMPTY  STOMACH 12 tablet 0   budesonide -formoterol  (SYMBICORT ) 80-4.5 MCG/ACT inhaler Inhale 2 puffs into the lungs 2 (two) times daily as needed. (Patient not taking: Reported on 01/16/2024)     cholecalciferol (VITAMIN D ) 1000 UNITS tablet Take 1,000 Units by mouth daily.     clonazePAM  (KLONOPIN ) 1 MG tablet TAKE 2 TABLETS BY MOUTH AT BEDTIME FOR SLEEP 60 tablet 5   Fluticasone -Umeclidin-Vilant (TRELEGY ELLIPTA ) 100-62.5-25 MCG/ACT AEPB Inhale 1 puff into the lungs daily. (Patient not taking: Reported on 01/16/2024) 14 each 0   indapamide  (LOZOL ) 1.25 MG tablet Take 1 tablet by mouth once daily 90 tablet 0   Nebivolol  HCl 20 MG TABS Take 1/2 (one-half) tablet by mouth once daily 45 tablet 0   pantoprazole  (PROTONIX ) 40 MG tablet TAKE 1 TABLET BY MOUTH ONCE DAILY AS NEEDED 90 tablet 0   phenazopyridine  (PYRIDIUM ) 200 MG tablet Take 1 tablet (200 mg total) by mouth 2 (two) times daily as needed. 60 tablet 11   telmisartan  (MICARDIS ) 20 MG tablet Take 1 tablet (20 mg total) by mouth daily. (Patient not taking: Reported on 01/16/2024) 90 tablet 1   triamcinolone (KENALOG) 0.1 % Apply 1 application topically as needed.     No current facility-administered medications on file prior to visit.     Review of Systems     Objective:  There were no vitals filed for this visit. BP Readings  from Last 3 Encounters:  01/16/24 136/74  11/17/23 (!) 140/80  11/07/23 (!) 142/84   Wt Readings from Last 3 Encounters:  01/16/24 178 lb 3.2 oz (80.8 kg)  11/17/23 176 lb (79.8 kg)  11/07/23 176 lb (79.8 kg)   There is no height or weight on file to calculate BMI.    Physical Exam     Lab Results  Component Value Date   WBC 7.4 11/17/2023   HGB 14.3 11/17/2023   HCT 42.1 11/17/2023   PLT 273.0 11/17/2023   GLUCOSE 119 (H) 11/17/2023   CHOL 206 (H) 11/17/2023   TRIG 106.0 11/17/2023   HDL 42.60 11/17/2023   LDLDIRECT 161.1 03/13/2012   LDLCALC 142 (H) 11/17/2023   ALT 11 11/17/2023   AST 15 11/17/2023   NA 140 11/17/2023   K 4.6 11/17/2023   CL 107 11/17/2023   CREATININE 0.74 11/17/2023   BUN 8 11/17/2023   CO2 27 11/17/2023   TSH 1.43 09/27/2022   PSA WNL 03/24/2014   HGBA1C 5.6 11/17/2023     Assessment & Plan:    See Problem List for Assessment and Plan of chronic medical problems.

## 2024-05-01 ENCOUNTER — Encounter: Payer: Self-pay | Admitting: Internal Medicine

## 2024-05-01 ENCOUNTER — Ambulatory Visit: Admitting: Internal Medicine

## 2024-05-01 ENCOUNTER — Other Ambulatory Visit: Payer: Self-pay | Admitting: Internal Medicine

## 2024-05-01 VITALS — BP 136/80 | HR 63 | Temp 98.5°F | Ht 62.0 in | Wt 178.5 lb

## 2024-05-01 DIAGNOSIS — E7800 Pure hypercholesterolemia, unspecified: Secondary | ICD-10-CM

## 2024-05-01 DIAGNOSIS — E66811 Obesity, class 1: Secondary | ICD-10-CM

## 2024-05-01 DIAGNOSIS — M810 Age-related osteoporosis without current pathological fracture: Secondary | ICD-10-CM

## 2024-05-01 DIAGNOSIS — I10 Essential (primary) hypertension: Secondary | ICD-10-CM

## 2024-05-01 DIAGNOSIS — R7303 Prediabetes: Secondary | ICD-10-CM

## 2024-05-01 DIAGNOSIS — K219 Gastro-esophageal reflux disease without esophagitis: Secondary | ICD-10-CM

## 2024-05-01 DIAGNOSIS — R232 Flushing: Secondary | ICD-10-CM

## 2024-05-01 DIAGNOSIS — Z808 Family history of malignant neoplasm of other organs or systems: Secondary | ICD-10-CM | POA: Insufficient documentation

## 2024-05-01 DIAGNOSIS — E559 Vitamin D deficiency, unspecified: Secondary | ICD-10-CM

## 2024-05-01 DIAGNOSIS — R229 Localized swelling, mass and lump, unspecified: Secondary | ICD-10-CM | POA: Insufficient documentation

## 2024-05-01 DIAGNOSIS — F5101 Primary insomnia: Secondary | ICD-10-CM

## 2024-05-01 LAB — COMPREHENSIVE METABOLIC PANEL WITH GFR
ALT: 12 U/L (ref 0–35)
AST: 16 U/L (ref 0–37)
Albumin: 4.3 g/dL (ref 3.5–5.2)
Alkaline Phosphatase: 48 U/L (ref 39–117)
BUN: 10 mg/dL (ref 6–23)
CO2: 28 meq/L (ref 19–32)
Calcium: 9.5 mg/dL (ref 8.4–10.5)
Chloride: 105 meq/L (ref 96–112)
Creatinine, Ser: 0.87 mg/dL (ref 0.40–1.20)
GFR: 68.04 mL/min (ref >60.00)
Glucose, Bld: 105 mg/dL — ABNORMAL HIGH (ref 70–99)
Potassium: 4.2 meq/L (ref 3.5–5.1)
Sodium: 141 meq/L (ref 135–145)
Total Bilirubin: 0.6 mg/dL (ref 0.2–1.2)
Total Protein: 6.7 g/dL (ref 6.0–8.3)

## 2024-05-01 LAB — CBC WITH DIFFERENTIAL/PLATELET
Basophils Absolute: 0.1 K/uL (ref 0.0–0.1)
Basophils Relative: 1.1 % (ref 0.0–3.0)
Eosinophils Absolute: 0.7 K/uL (ref 0.0–0.7)
Eosinophils Relative: 8.3 % — ABNORMAL HIGH (ref 0.0–5.0)
HCT: 39.9 % (ref 36.0–46.0)
Hemoglobin: 13.7 g/dL (ref 12.0–15.0)
Lymphocytes Relative: 16.8 % (ref 12.0–46.0)
Lymphs Abs: 1.3 K/uL (ref 0.7–4.0)
MCHC: 34.4 g/dL (ref 30.0–36.0)
MCV: 88.6 fl (ref 78.0–100.0)
Monocytes Absolute: 0.5 K/uL (ref 0.1–1.0)
Monocytes Relative: 5.9 % (ref 3.0–12.0)
Neutro Abs: 5.3 K/uL (ref 1.4–7.7)
Neutrophils Relative %: 67.9 % (ref 43.0–77.0)
Platelets: 251 K/uL (ref 150.0–400.0)
RBC: 4.5 Mil/uL (ref 3.87–5.11)
RDW: 13 % (ref 11.5–15.5)
WBC: 7.9 K/uL (ref 4.0–10.5)

## 2024-05-01 LAB — LIPID PANEL
Cholesterol: 202 mg/dL — ABNORMAL HIGH (ref 0–200)
HDL: 48.2 mg/dL (ref >39.00)
LDL Cholesterol: 136 mg/dL — ABNORMAL HIGH (ref 0–99)
NonHDL: 154.02
Total CHOL/HDL Ratio: 4
Triglycerides: 90 mg/dL (ref 0.0–149.0)
VLDL: 18 mg/dL (ref 0.0–40.0)

## 2024-05-01 LAB — HEMOGLOBIN A1C: Hgb A1c MFr Bld: 5.4 % (ref 4.6–6.5)

## 2024-05-01 LAB — TSH: TSH: 1.06 u[IU]/mL (ref 0.35–5.50)

## 2024-05-01 LAB — VITAMIN D 25 HYDROXY (VIT D DEFICIENCY, FRACTURES): VITD: 38.05 ng/mL (ref 30.00–100.00)

## 2024-05-01 MED ORDER — DOXYCYCLINE HYCLATE 100 MG PO TABS: 100.0000 mg | ORAL_TABLET | Freq: Two times a day (BID) | ORAL | 0 refills | Status: AC

## 2024-05-01 NOTE — Assessment & Plan Note (Addendum)
 Chronic Taking clonazepam  nightly - I will prescribe since Dr Neysa is retiring

## 2024-05-01 NOTE — Assessment & Plan Note (Signed)
 Chronic BMI 32.65 Encouraged regular exercise, healthy diet and decreased portions

## 2024-05-01 NOTE — Assessment & Plan Note (Signed)
 New In right lateral upper leg and abdomen nontender Likely lipomas US  ordered

## 2024-05-01 NOTE — Assessment & Plan Note (Signed)
 Chronic Intermittent She states her cheeks get red, but she denies any sweats.  She does feel warm. This may last 10-15 minutes and then goes away No other symptoms Has seen cardiology-not cardiac Has seen GYN-not gynecological Blood work in the past has been normal-Will recheck

## 2024-05-01 NOTE — Assessment & Plan Note (Addendum)
 Chronic Blood pressure controlled without the indapamide  CMP Continue nebivolol  10 mg daily Take indapamide  as needed only Advised to keep monitoring BP at home

## 2024-05-01 NOTE — Assessment & Plan Note (Signed)
Chronic GERD controlled Continue pantoprazole 40 mg daily prn  

## 2024-05-01 NOTE — Assessment & Plan Note (Signed)
 Chronic Check lipid panel, CMP, TSH Stressed regular exercise, healthy diet Coronary artery calcium  score is 0 Elevated ASCVD risk Prefers to not take any medication Continue lifestyle control

## 2024-05-01 NOTE — Assessment & Plan Note (Signed)
 Chronic Lab Results  Component Value Date   HGBA1C 5.6 11/17/2023   Check a1c Low sugar / carb diet Stressed regular exercise

## 2024-05-01 NOTE — Assessment & Plan Note (Signed)
 Chronic Taking vitamin D daily Check vitamin D level

## 2024-05-01 NOTE — Assessment & Plan Note (Signed)
 Chronic DEXA up to date-due 12/2025 Taking fosamax  70 mg weekly - started 09/2022-plan to continue for 5 years Taking calcium  and vitamin D  Check vitamin D  level Stressed regular exercise-weightbearing exercise, resistance training

## 2024-05-02 ENCOUNTER — Ambulatory Visit: Payer: Self-pay | Admitting: Internal Medicine

## 2024-05-08 ENCOUNTER — Other Ambulatory Visit: Payer: Self-pay | Admitting: Internal Medicine

## 2024-05-08 NOTE — Telephone Encounter (Signed)
 Clonazepam refilled

## 2024-05-08 NOTE — Telephone Encounter (Signed)
Dr Maple Hudson please advise, thank you.

## 2024-05-17 ENCOUNTER — Other Ambulatory Visit: Payer: Self-pay | Admitting: Internal Medicine

## 2024-05-17 DIAGNOSIS — I10 Essential (primary) hypertension: Secondary | ICD-10-CM

## 2024-05-19 ENCOUNTER — Other Ambulatory Visit: Payer: Self-pay | Admitting: Internal Medicine

## 2024-05-28 ENCOUNTER — Other Ambulatory Visit

## 2024-05-30 ENCOUNTER — Ambulatory Visit
Admission: RE | Admit: 2024-05-30 | Discharge: 2024-05-30 | Disposition: A | Discharge status: Home / Self Care | Source: Ambulatory Visit | Attending: Internal Medicine | Admitting: Internal Medicine

## 2024-05-30 DIAGNOSIS — R229 Localized swelling, mass and lump, unspecified: Secondary | ICD-10-CM

## 2024-11-01 ENCOUNTER — Ambulatory Visit: Admitting: Internal Medicine

## 2024-11-21 ENCOUNTER — Ambulatory Visit
